# Patient Record
Sex: Female | Born: 1940
Health system: Southern US, Community
[De-identification: ages and names within clinical notes are randomized; demographics above are authoritative.]

## PROBLEM LIST (undated history)

## (undated) DIAGNOSIS — L501 Idiopathic urticaria: Secondary | ICD-10-CM

## (undated) DIAGNOSIS — N6009 Solitary cyst of unspecified breast: Secondary | ICD-10-CM

## (undated) DIAGNOSIS — I2699 Other pulmonary embolism without acute cor pulmonale: Secondary | ICD-10-CM

## (undated) DIAGNOSIS — T783XXA Angioneurotic edema, initial encounter: Secondary | ICD-10-CM

## (undated) DIAGNOSIS — I10 Essential (primary) hypertension: Secondary | ICD-10-CM

## (undated) DIAGNOSIS — R609 Edema, unspecified: Secondary | ICD-10-CM

## (undated) DIAGNOSIS — J45909 Unspecified asthma, uncomplicated: Secondary | ICD-10-CM

## (undated) DIAGNOSIS — G4733 Obstructive sleep apnea (adult) (pediatric): Secondary | ICD-10-CM

## (undated) DIAGNOSIS — I509 Heart failure, unspecified: Secondary | ICD-10-CM

## (undated) DIAGNOSIS — M810 Age-related osteoporosis without current pathological fracture: Secondary | ICD-10-CM

## (undated) HISTORY — DX: Heart failure, unspecified: I50.9

## (undated) HISTORY — DX: Angioneurotic edema, initial encounter: T78.3XXA

## (undated) HISTORY — DX: Age-related osteoporosis without current pathological fracture: M81.0

## (undated) HISTORY — PX: BREAST EXCISIONAL BIOPSY: SUR124

## (undated) HISTORY — DX: Edema, unspecified: R60.9

## (undated) HISTORY — DX: Idiopathic urticaria: L50.1

## (undated) HISTORY — DX: Other pulmonary embolism without acute cor pulmonale: I26.99

## (undated) HISTORY — DX: Essential (primary) hypertension: I10

## (undated) HISTORY — DX: Obstructive sleep apnea (adult) (pediatric): G47.33

## (undated) HISTORY — DX: Solitary cyst of unspecified breast: N60.09

## (undated) HISTORY — PX: BREAST SURGERY: SHX581

---

## 1998-08-10 ENCOUNTER — Ambulatory Visit (HOSPITAL_COMMUNITY): Admission: RE | Admit: 1998-08-10 | Discharge: 1998-08-10 | Payer: Self-pay | Admitting: Obstetrics and Gynecology

## 1998-08-13 ENCOUNTER — Ambulatory Visit (HOSPITAL_COMMUNITY): Admission: RE | Admit: 1998-08-13 | Discharge: 1998-08-13 | Payer: Self-pay | Admitting: Family Medicine

## 1998-08-13 ENCOUNTER — Encounter: Payer: Self-pay | Admitting: Family Medicine

## 1999-02-23 ENCOUNTER — Other Ambulatory Visit: Admission: RE | Admit: 1999-02-23 | Discharge: 1999-02-23 | Payer: Self-pay | Admitting: Obstetrics and Gynecology

## 1999-09-12 ENCOUNTER — Ambulatory Visit (HOSPITAL_COMMUNITY): Admission: RE | Admit: 1999-09-12 | Discharge: 1999-09-12 | Payer: Self-pay | Admitting: Obstetrics and Gynecology

## 1999-09-12 ENCOUNTER — Encounter: Payer: Self-pay | Admitting: Obstetrics and Gynecology

## 2000-11-07 ENCOUNTER — Encounter: Payer: Self-pay | Admitting: Obstetrics and Gynecology

## 2000-11-07 ENCOUNTER — Ambulatory Visit (HOSPITAL_COMMUNITY): Admission: RE | Admit: 2000-11-07 | Discharge: 2000-11-07 | Payer: Self-pay | Admitting: Obstetrics and Gynecology

## 2002-01-20 ENCOUNTER — Ambulatory Visit (HOSPITAL_COMMUNITY): Admission: RE | Admit: 2002-01-20 | Discharge: 2002-01-20 | Payer: Self-pay | Admitting: Obstetrics and Gynecology

## 2002-01-20 ENCOUNTER — Encounter: Payer: Self-pay | Admitting: Obstetrics and Gynecology

## 2002-08-06 ENCOUNTER — Other Ambulatory Visit: Admission: RE | Admit: 2002-08-06 | Discharge: 2002-08-06 | Payer: Self-pay | Admitting: Obstetrics and Gynecology

## 2002-08-20 ENCOUNTER — Encounter: Payer: Self-pay | Admitting: Family Medicine

## 2002-08-20 ENCOUNTER — Ambulatory Visit (HOSPITAL_COMMUNITY): Admission: RE | Admit: 2002-08-20 | Discharge: 2002-08-20 | Payer: Self-pay | Admitting: *Deleted

## 2003-02-24 ENCOUNTER — Encounter: Payer: Self-pay | Admitting: Obstetrics and Gynecology

## 2003-02-24 ENCOUNTER — Ambulatory Visit (HOSPITAL_COMMUNITY): Admission: RE | Admit: 2003-02-24 | Discharge: 2003-02-24 | Payer: Self-pay | Admitting: Obstetrics and Gynecology

## 2003-09-07 ENCOUNTER — Other Ambulatory Visit: Admission: RE | Admit: 2003-09-07 | Discharge: 2003-09-07 | Payer: Self-pay | Admitting: Obstetrics and Gynecology

## 2004-05-31 ENCOUNTER — Ambulatory Visit (HOSPITAL_COMMUNITY): Admission: RE | Admit: 2004-05-31 | Discharge: 2004-05-31 | Payer: Self-pay | Admitting: Obstetrics and Gynecology

## 2005-01-11 ENCOUNTER — Other Ambulatory Visit: Admission: RE | Admit: 2005-01-11 | Discharge: 2005-01-11 | Payer: Self-pay | Admitting: Obstetrics and Gynecology

## 2006-02-14 ENCOUNTER — Ambulatory Visit (HOSPITAL_COMMUNITY): Admission: RE | Admit: 2006-02-14 | Discharge: 2006-02-14 | Payer: Self-pay | Admitting: Obstetrics and Gynecology

## 2006-03-14 ENCOUNTER — Other Ambulatory Visit: Admission: RE | Admit: 2006-03-14 | Discharge: 2006-03-14 | Payer: Self-pay | Admitting: Obstetrics and Gynecology

## 2006-06-12 ENCOUNTER — Ambulatory Visit (HOSPITAL_COMMUNITY): Admission: RE | Admit: 2006-06-12 | Discharge: 2006-06-12 | Payer: Self-pay | Admitting: Family Medicine

## 2007-03-26 ENCOUNTER — Ambulatory Visit (HOSPITAL_COMMUNITY): Admission: RE | Admit: 2007-03-26 | Discharge: 2007-03-26 | Payer: Self-pay | Admitting: Obstetrics and Gynecology

## 2007-05-14 ENCOUNTER — Other Ambulatory Visit: Admission: RE | Admit: 2007-05-14 | Discharge: 2007-05-14 | Payer: Self-pay | Admitting: Obstetrics and Gynecology

## 2007-07-15 ENCOUNTER — Encounter: Admission: RE | Admit: 2007-07-15 | Discharge: 2007-07-15 | Payer: Self-pay | Admitting: Family Medicine

## 2007-07-23 ENCOUNTER — Other Ambulatory Visit: Admission: RE | Admit: 2007-07-23 | Discharge: 2007-07-23 | Payer: Self-pay | Admitting: Diagnostic Radiology

## 2007-07-23 ENCOUNTER — Encounter (INDEPENDENT_AMBULATORY_CARE_PROVIDER_SITE_OTHER): Payer: Self-pay | Admitting: Diagnostic Radiology

## 2007-07-23 ENCOUNTER — Encounter: Admission: RE | Admit: 2007-07-23 | Discharge: 2007-07-23 | Payer: Self-pay | Admitting: Family Medicine

## 2008-05-21 ENCOUNTER — Ambulatory Visit (HOSPITAL_COMMUNITY): Admission: RE | Admit: 2008-05-21 | Discharge: 2008-05-21 | Payer: Self-pay | Admitting: Obstetrics and Gynecology

## 2009-05-28 ENCOUNTER — Ambulatory Visit (HOSPITAL_COMMUNITY): Admission: RE | Admit: 2009-05-28 | Discharge: 2009-05-28 | Payer: Self-pay | Admitting: Obstetrics and Gynecology

## 2009-06-03 ENCOUNTER — Other Ambulatory Visit: Admission: RE | Admit: 2009-06-03 | Discharge: 2009-06-03 | Payer: Self-pay | Admitting: Obstetrics and Gynecology

## 2009-06-03 ENCOUNTER — Ambulatory Visit: Payer: Self-pay | Admitting: Obstetrics and Gynecology

## 2009-07-07 ENCOUNTER — Ambulatory Visit: Payer: Self-pay | Admitting: Obstetrics and Gynecology

## 2009-07-08 ENCOUNTER — Ambulatory Visit: Payer: Self-pay | Admitting: Obstetrics and Gynecology

## 2010-05-29 ENCOUNTER — Encounter: Payer: Self-pay | Admitting: Family Medicine

## 2010-07-26 ENCOUNTER — Other Ambulatory Visit: Payer: Self-pay | Admitting: Obstetrics and Gynecology

## 2010-07-26 DIAGNOSIS — Z1231 Encounter for screening mammogram for malignant neoplasm of breast: Secondary | ICD-10-CM

## 2010-08-02 ENCOUNTER — Ambulatory Visit (HOSPITAL_COMMUNITY)
Admission: RE | Admit: 2010-08-02 | Discharge: 2010-08-02 | Disposition: A | Discharge status: Home / Self Care | Payer: BC Managed Care – PPO | Source: Ambulatory Visit | Attending: Obstetrics and Gynecology | Admitting: Obstetrics and Gynecology

## 2010-08-02 DIAGNOSIS — Z1231 Encounter for screening mammogram for malignant neoplasm of breast: Secondary | ICD-10-CM | POA: Insufficient documentation

## 2010-12-22 ENCOUNTER — Encounter: Payer: Self-pay | Admitting: Gynecology

## 2010-12-22 DIAGNOSIS — M8580 Other specified disorders of bone density and structure, unspecified site: Secondary | ICD-10-CM | POA: Insufficient documentation

## 2010-12-22 DIAGNOSIS — I10 Essential (primary) hypertension: Secondary | ICD-10-CM | POA: Insufficient documentation

## 2010-12-22 DIAGNOSIS — N6009 Solitary cyst of unspecified breast: Secondary | ICD-10-CM | POA: Insufficient documentation

## 2010-12-30 ENCOUNTER — Encounter: Payer: Self-pay | Admitting: Obstetrics and Gynecology

## 2010-12-30 ENCOUNTER — Ambulatory Visit (INDEPENDENT_AMBULATORY_CARE_PROVIDER_SITE_OTHER): Payer: BC Managed Care – PPO | Admitting: Obstetrics and Gynecology

## 2010-12-30 VITALS — BP 130/80 | Ht 63.5 in | Wt 243.0 lb

## 2010-12-30 DIAGNOSIS — R82998 Other abnormal findings in urine: Secondary | ICD-10-CM

## 2010-12-30 DIAGNOSIS — R102 Pelvic and perineal pain: Secondary | ICD-10-CM

## 2010-12-30 DIAGNOSIS — R3915 Urgency of urination: Secondary | ICD-10-CM

## 2010-12-30 DIAGNOSIS — R350 Frequency of micturition: Secondary | ICD-10-CM

## 2010-12-30 DIAGNOSIS — M8580 Other specified disorders of bone density and structure, unspecified site: Secondary | ICD-10-CM

## 2010-12-30 DIAGNOSIS — M949 Disorder of cartilage, unspecified: Secondary | ICD-10-CM

## 2010-12-30 DIAGNOSIS — N949 Unspecified condition associated with female genital organs and menstrual cycle: Secondary | ICD-10-CM

## 2010-12-30 DIAGNOSIS — M899 Disorder of bone, unspecified: Secondary | ICD-10-CM

## 2010-12-30 NOTE — Progress Notes (Signed)
The patient came to see me today. She says she's now getting up every one to 2 hours to go to the bathroom. It is just urinating. She is not incontinent of urine. She does not have dysuria. She does have a lot of urgency. She is now being evaluated for glaucoma by her eye doctor. She does have low bone mass on DEXA. She has a normal fracture risk. Her bone density has improved from 2009 to 2011. She is taking her calcium and D. She has had no fractures. She is having some lower abdominal pressure along with this frequent urination.  Past medical history, family history, and social history reviewed and in record.  Review of systems: 9 system review of systems done. Along with a pertinent positives and negatives above, she gives a history of weight gain, lightheadedness, hypertension, and heartburn.  Physical examination: HEENT within normal limits. Neck: Thyroid not large. No masses. Supraclavicular nodes: not enlarged. Breasts: Examined in both sitting midline position. No skin changes and no masses. Abdomen: Soft no guarding rebound or masses or hernia. Pelvic: External: Within normal limits. BUS: Within normal limits. Vaginal:within normal limits. Good estrogen effect. No evidence of cystocele, rectocele or enterocele. Cervix clean. Uterus normal size and shape. Adnexa fails to reveal masses. Rectovaginal confirmatory. Extremities within normal limits  Assessment: 1. Extreme nocturia 2. Urinary urgency 3. Osteopenia 4. Possible glaucoma   Plan: 1. Ultrasound scheduled because of the above symptoms 2.Continue yearly mammograms 3. Continue periodic bone densities 4. Patient given samples of enable access 7.5 mg daily. She will not start them until she discusses this with her eye doctor.

## 2011-01-02 ENCOUNTER — Telehealth: Payer: Self-pay

## 2011-01-02 DIAGNOSIS — N390 Urinary tract infection, site not specified: Secondary | ICD-10-CM

## 2011-01-02 MED ORDER — CIPROFLOXACIN HCL 250 MG PO TABS: 250.0000 mg | ORAL_TABLET | Freq: Two times a day (BID) | ORAL | Status: AC

## 2011-01-02 NOTE — Telephone Encounter (Signed)
Message copied by Venora Maples on Mon Jan 02, 2011 12:11 PM ------      Message from: Trellis Paganini      Created: Mon Jan 02, 2011  9:47 AM       The patient has UTI. Please call her and tell her we are going to treat her with Cipro 250 mg twice a day for 7 days. She then needs to return for followup urine culture. I will e- prescribed to her medication.

## 2011-01-02 NOTE — Progress Notes (Signed)
Addended by: Trellis Paganini on: 01/02/2011 09:50 AM   Modules accepted: Orders

## 2011-01-04 NOTE — Telephone Encounter (Signed)
PT. NOTIFIIED OF DR. G'S NOTE BELOW & SHE IS HAVING URINARY URGENCY. SHE WILL PICK UP RX TODAY. SHE HAS AN APPT. 01/13/11 FOR AN ULTRASOUND & WILL CHECK URINE CULTURE THEN. I PUT ORDER IN MEDS & ORDERS.

## 2011-01-11 ENCOUNTER — Other Ambulatory Visit: Payer: BC Managed Care – PPO

## 2011-01-11 ENCOUNTER — Ambulatory Visit: Payer: BC Managed Care – PPO | Admitting: Obstetrics and Gynecology

## 2011-01-13 ENCOUNTER — Other Ambulatory Visit: Payer: BC Managed Care – PPO

## 2011-01-13 ENCOUNTER — Ambulatory Visit (INDEPENDENT_AMBULATORY_CARE_PROVIDER_SITE_OTHER): Payer: BC Managed Care – PPO | Admitting: Obstetrics and Gynecology

## 2011-01-13 DIAGNOSIS — N949 Unspecified condition associated with female genital organs and menstrual cycle: Secondary | ICD-10-CM

## 2011-01-13 DIAGNOSIS — N390 Urinary tract infection, site not specified: Secondary | ICD-10-CM

## 2011-01-13 DIAGNOSIS — R102 Pelvic and perineal pain: Secondary | ICD-10-CM

## 2011-01-13 DIAGNOSIS — D259 Leiomyoma of uterus, unspecified: Secondary | ICD-10-CM

## 2011-01-13 DIAGNOSIS — D219 Benign neoplasm of connective and other soft tissue, unspecified: Secondary | ICD-10-CM

## 2011-01-13 DIAGNOSIS — N840 Polyp of corpus uteri: Secondary | ICD-10-CM

## 2011-01-13 DIAGNOSIS — Z5189 Encounter for other specified aftercare: Secondary | ICD-10-CM

## 2011-01-13 NOTE — Progress Notes (Signed)
Addended byCammie Mcgee T on: 01/13/2011 04:50 PM   Modules accepted: Orders

## 2011-01-13 NOTE — Progress Notes (Signed)
The patient came back today for an ultrasound because of lower abdominal pressure and urinary frequency. On ultrasound she has 2 fibroids both of which are under 1 cm. Her endometrial echo is 6.5 mm and looks like she has a small endometrial polyp of 6 mm. She is having no vaginal bleeding. Her right ovary is normal. On her left ovary she has echo-free thin-walled avascular cyst of 1.5 cm. Her cul-de-sac is free of fluid. Her urine culture came back positive and she was treated with Cipro for 7 days. She says her urinary symptoms are 95% improved.  Assessment: 1. Fibroids 2. Small endometrial polyp 3. UTI 4. Benign left ovarian cyst  Plan: Discussed in detail the findings of her ultrasound. I told her we did not need to intervene with  the small polyp or the cyst. She will do a urine and urine culture to day to be sure her UTI is cleared. She will call me with any vaginal bleeding.

## 2011-09-11 ENCOUNTER — Other Ambulatory Visit: Payer: Self-pay | Admitting: Obstetrics and Gynecology

## 2011-09-11 DIAGNOSIS — Z1231 Encounter for screening mammogram for malignant neoplasm of breast: Secondary | ICD-10-CM

## 2011-10-10 ENCOUNTER — Ambulatory Visit (HOSPITAL_COMMUNITY)
Admission: RE | Admit: 2011-10-10 | Discharge: 2011-10-10 | Disposition: A | Discharge status: Home / Self Care | Payer: BC Managed Care – PPO | Source: Ambulatory Visit | Attending: Obstetrics and Gynecology | Admitting: Obstetrics and Gynecology

## 2011-10-10 DIAGNOSIS — Z1231 Encounter for screening mammogram for malignant neoplasm of breast: Secondary | ICD-10-CM

## 2011-11-21 ENCOUNTER — Other Ambulatory Visit: Payer: Self-pay | Admitting: Family Medicine

## 2011-11-21 ENCOUNTER — Ambulatory Visit
Admission: RE | Admit: 2011-11-21 | Discharge: 2011-11-21 | Disposition: A | Discharge status: Home / Self Care | Payer: BC Managed Care – PPO | Source: Ambulatory Visit | Attending: Family Medicine | Admitting: Family Medicine

## 2011-11-21 DIAGNOSIS — M1250 Traumatic arthropathy, unspecified site: Secondary | ICD-10-CM

## 2011-12-06 ENCOUNTER — Other Ambulatory Visit: Payer: Self-pay | Admitting: Orthopedic Surgery

## 2011-12-06 DIAGNOSIS — M25512 Pain in left shoulder: Secondary | ICD-10-CM

## 2011-12-11 ENCOUNTER — Ambulatory Visit
Admission: RE | Admit: 2011-12-11 | Discharge: 2011-12-11 | Disposition: A | Discharge status: Home / Self Care | Payer: Federal, State, Local not specified - PPO | Source: Ambulatory Visit | Attending: Orthopedic Surgery | Admitting: Orthopedic Surgery

## 2011-12-11 DIAGNOSIS — M25512 Pain in left shoulder: Secondary | ICD-10-CM

## 2012-01-22 ENCOUNTER — Encounter: Payer: Self-pay | Admitting: Obstetrics and Gynecology

## 2012-01-22 ENCOUNTER — Ambulatory Visit (INDEPENDENT_AMBULATORY_CARE_PROVIDER_SITE_OTHER): Payer: Federal, State, Local not specified - PPO | Admitting: Obstetrics and Gynecology

## 2012-01-22 VITALS — BP 148/82 | Ht 63.0 in | Wt 250.0 lb

## 2012-01-22 DIAGNOSIS — Z01419 Encounter for gynecological examination (general) (routine) without abnormal findings: Secondary | ICD-10-CM

## 2012-01-22 DIAGNOSIS — R351 Nocturia: Secondary | ICD-10-CM

## 2012-01-22 MED ORDER — DARIFENACIN HYDROBROMIDE ER 7.5 MG PO TB24: ORAL_TABLET | ORAL | Status: DC

## 2012-01-22 NOTE — Patient Instructions (Signed)
Continue yearly mammograms. Scheduled bone density in 2014.

## 2012-01-22 NOTE — Progress Notes (Signed)
Patient came to see me  today for her annual GYN exam. We have had her on Enablex 7.5 mg daily for urgency and nocturia with some good results but she thinks the medicine wears off as the middle the night continues. She had a ultrasound last year here for pelvic pain which she is not currently having. It showed  small fibroids and an avascular, echo-free ovarian cyst of 1.5 cm. There was a suggestion of a 6 mm endometrial polyp but the patient has had no bleeding. She had a bone density in March, 2011 which showed low bone mass without an elevated FRAX risk. Her bone density was better than previous ones. She's had no fractures. She is having no vaginal bleeding. She is having no pelvic pain. She has always had normal Pap smears. Her last Pap smear was 2011. She had a normal mammogram this year.  Physical examination:  Leonard Schwartz present. HEENT within normal limits. Neck: Thyroid not large. No masses. Supraclavicular nodes: not enlarged. Breasts: Examined in both sitting and lying  position. No skin changes and no masses. Abdomen: Soft no guarding rebound or masses or hernia. Pelvic: External: Within normal limits. BUS: Within normal limits. Vaginal:within normal limits. Good estrogen effect. No evidence of cystocele rectocele or enterocele. Cervix: clean. Uterus:Top normal size and shape. Adnexa: No masses. Rectovaginal exam: Confirmatory and negative. Extremities: Within normal limits.  Assessment: #1. Osteopenia #2. Detrussor instability #3. Small fibroids #4. Possible small endometrial polyp  Plan: We increased her Enablex 7.5 mg twice a day. She will let me know if not effective. She will continue yearly mammograms. She will continue periodic bone densities-we should do one in 2014. Lab work from her PCP.The new Pap smear guidelines were discussed with the patient. No  pap done.

## 2012-01-29 ENCOUNTER — Encounter: Payer: Self-pay | Admitting: Obstetrics and Gynecology

## 2012-02-16 ENCOUNTER — Telehealth: Payer: Self-pay | Admitting: *Deleted

## 2012-02-16 NOTE — Telephone Encounter (Signed)
Pt called back today and the patient was given the option to come back to office to check or have checked at her work and she chose to have it ran at her work. Therefore an order was faxed to her job. KW

## 2012-02-16 NOTE — Telephone Encounter (Signed)
(  late entry) I left a message for the patient since at her [facility] she didn't leave a UA sample and she complained of nocturia.

## 2012-10-11 ENCOUNTER — Other Ambulatory Visit: Payer: Self-pay | Admitting: Otolaryngology

## 2012-10-11 DIAGNOSIS — H903 Sensorineural hearing loss, bilateral: Secondary | ICD-10-CM

## 2012-10-11 DIAGNOSIS — H9319 Tinnitus, unspecified ear: Secondary | ICD-10-CM

## 2012-10-11 DIAGNOSIS — H905 Unspecified sensorineural hearing loss: Secondary | ICD-10-CM

## 2012-10-23 ENCOUNTER — Ambulatory Visit
Admission: RE | Admit: 2012-10-23 | Discharge: 2012-10-23 | Disposition: A | Discharge status: Home / Self Care | Payer: Federal, State, Local not specified - PPO | Source: Ambulatory Visit | Attending: Otolaryngology | Admitting: Otolaryngology

## 2012-10-23 DIAGNOSIS — H9319 Tinnitus, unspecified ear: Secondary | ICD-10-CM

## 2012-10-23 DIAGNOSIS — H903 Sensorineural hearing loss, bilateral: Secondary | ICD-10-CM

## 2012-10-23 DIAGNOSIS — H905 Unspecified sensorineural hearing loss: Secondary | ICD-10-CM

## 2012-10-23 MED ORDER — GADOBENATE DIMEGLUMINE 529 MG/ML IV SOLN
20.0000 mL | Freq: Once | INTRAVENOUS | Status: AC | PRN
  Administered 2012-10-23: 20 mL via INTRAVENOUS

## 2012-11-19 ENCOUNTER — Other Ambulatory Visit: Payer: Self-pay | Admitting: Gynecology

## 2012-11-19 DIAGNOSIS — Z1231 Encounter for screening mammogram for malignant neoplasm of breast: Secondary | ICD-10-CM

## 2012-11-23 ENCOUNTER — Emergency Department (HOSPITAL_COMMUNITY)
Admission: EM | Admit: 2012-11-23 | Discharge: 2012-11-23 | Disposition: A | Discharge status: Home / Self Care | Payer: Federal, State, Local not specified - PPO | Source: Home / Self Care

## 2012-11-23 ENCOUNTER — Encounter (HOSPITAL_COMMUNITY): Payer: Self-pay | Admitting: *Deleted

## 2012-11-23 DIAGNOSIS — R220 Localized swelling, mass and lump, head: Secondary | ICD-10-CM

## 2012-11-23 DIAGNOSIS — T7840XA Allergy, unspecified, initial encounter: Secondary | ICD-10-CM

## 2012-11-23 DIAGNOSIS — L509 Urticaria, unspecified: Secondary | ICD-10-CM

## 2012-11-23 DIAGNOSIS — R221 Localized swelling, mass and lump, neck: Secondary | ICD-10-CM

## 2012-11-23 MED ORDER — METHYLPREDNISOLONE ACETATE 80 MG/ML IJ SUSP
INTRAMUSCULAR | Status: AC
  Filled 2012-11-23: qty 1

## 2012-11-23 MED ORDER — DIPHENHYDRAMINE HCL 50 MG/ML IJ SOLN
50.0000 mg | Freq: Once | INTRAMUSCULAR | Status: AC
  Administered 2012-11-23: 50 mg via INTRAMUSCULAR

## 2012-11-23 MED ORDER — TRIAMCINOLONE ACETONIDE 40 MG/ML IJ SUSP
INTRAMUSCULAR | Status: AC
  Filled 2012-11-23: qty 1

## 2012-11-23 MED ORDER — TRIAMCINOLONE ACETONIDE 0.1 % EX CREA: TOPICAL_CREAM | CUTANEOUS | Status: DC

## 2012-11-23 MED ORDER — DIPHENHYDRAMINE HCL 50 MG/ML IJ SOLN
INTRAMUSCULAR | Status: AC
  Filled 2012-11-23: qty 1

## 2012-11-23 MED ORDER — TRIAMCINOLONE ACETONIDE 40 MG/ML IJ SUSP
60.0000 mg | Freq: Once | INTRAMUSCULAR | Status: AC
  Administered 2012-11-23: 60 mg via INTRAMUSCULAR

## 2012-11-23 NOTE — ED Provider Notes (Signed)
History    CSN: [id number] Arrival date & time 11/23/12  1255  First MD Initiated Contact with Patient 11/23/12 1412     Chief Complaint  Patient presents with  . Allergic Reaction   (Consider location/radiation/quality/duration/timing/severity/associated sxs/prior Treatment) HPI Comments: 72 year old female awoke this morning with swelling of the right side of her face. This was followed by a large area of urticaria to the right upper arm. She is complaining of itching in various spots of her body including her left palm. He swelling to the right side of the face has migrated to the lips and she is rather large swollen and itchy upper and lower lids. There care he is limited to the right upper arm and no other areas of the body appear to be affected. She denies problems with speaking, swallowing or breathing. No areas of swelling. Her speech is clear and appropriate.  Past Medical History  Diagnosis Date  . Breast cyst   . Osteopenia   . Hypertension    Past Surgical History  Procedure Laterality Date  . Breast surgery      biopsy-cyst   Family History  Problem Relation Age of Onset  . Breast cancer Mother 15  . Heart disease Father 63    MI   History  Substance Use Topics  . Smoking status: Never Smoker   . Smokeless tobacco: Never Used  . Alcohol Use: Yes     Comment: wine occassionally   OB History   Grav Para Term Preterm Abortions TAB SAB Ect Mult Living   4 3 3  1     3      Review of Systems  Constitutional: Negative for fever, chills, activity change, appetite change and fatigue.  HENT: Negative for hearing loss, ear pain, congestion, sore throat, rhinorrhea, sneezing, drooling, neck pain, voice change, postnasal drip and ear discharge.   Eyes: Negative.   Respiratory: Negative for cough, choking, shortness of breath, wheezing and stridor.   Cardiovascular: Negative.   Gastrointestinal: Negative.   Genitourinary: Negative.   Skin: Positive for rash.   Neurological: Negative.     Allergies  Review of patient's allergies indicates no known allergies.  Home Medications   Current Outpatient Rx  Name  Route  Sig  Dispense  Refill  . Amlodipine-Olmesartan (AZOR PO)   Oral   Take by mouth daily.           Marland Kitchen darifenacin (ENABLEX) 7.5 MG 24 hr tablet      Take one pill twice a day   60 tablet   12   . ECHINACEA PO   Oral   Take by mouth.           . Multiple Vitamin (MULTIVITAMIN) tablet   Oral   Take 1 tablet by mouth daily. occassionally         . triamcinolone cream (KENALOG) 0.1 %      Apply to affected area BID   30 g   0    BP 135/72  Pulse 66  Temp(Src) 97.9 F (36.6 C) (Oral)  Resp 16  SpO2 100% Physical Exam  Nursing note and vitals reviewed. Constitutional: She is oriented to person, place, and time. She appears well-developed and well-nourished. No distress.  HENT:  Nose: Nose normal.  Mouth/Throat: Oropharynx is clear and moist. No oropharyngeal exudate.  No intraoral structures with swelling or erythema. The upper and lower lips are edematous and mildly tender.  Eyes: Conjunctivae and EOM are normal. Pupils are  equal, round, and reactive to light.  Neck: Normal range of motion. Neck supple.  Cardiovascular: Normal rate and regular rhythm.   Pulmonary/Chest: Effort normal and breath sounds normal. No respiratory distress. She has no wheezes. She has no rales.  Musculoskeletal: She exhibits no edema and no tenderness.  Lymphadenopathy:    She has no cervical adenopathy.  Neurological: She is alert and oriented to person, place, and time. She exhibits normal muscle tone.  Skin: Skin is warm and dry.  Well marginated but large wheal located over the  medial aspect of the right upper arm.  Psychiatric: She has a normal mood and affect.    ED Course  Procedures (including critical care time) Labs Reviewed - No data to display No results found. 1. Urticaria   2. Facial swelling   3. Acute  allergic reaction, initial encounter     MDM  This appears to be a localized allergic reaction. The distribution to the right side of the face and lips as well as the right upper arm suggests this may be due to a sting or bite of some sort. There is no one vomit of the lungs. No cough, wheeze or shortness of breath. No intraoral edema or swelling. Benadryl 50 mg IM and Kenalog 60 mg IM Place ice on the areas of swelling. Continue the Benadryl 2 mg by mouth q. 4-6 hours when necessary at home they also had Allegra or Claritin one tablet a day. For worsening new symptoms or problems such as breathing problems, coughing or swelling in the mouth or to emergency apartment.  Hayden Rasmussen, NP 11/23/12 1441

## 2012-11-23 NOTE — ED Provider Notes (Signed)
Medical screening examination/treatment/procedure(s) were performed by non-physician practitioner and as supervising physician I was immediately available for consultation/collaboration.  Leslee Home, M.D.  Reuben Likes, MD 11/23/12 [phone]

## 2012-11-23 NOTE — ED Notes (Signed)
Pt  Reports  She  Noticed       To   Have     Swelling to  Her  Lips  Which  Started  Out r  Side  Of face     This  Am    -  She  Also has  Red  Swollen itchy  Area to r  Upper  Arm         Denys  Any new  meds  Or  Any known  Causative  Agents       Sitting upright on  Exam table  Speaking in  Complete   sentances

## 2012-12-02 ENCOUNTER — Ambulatory Visit (HOSPITAL_COMMUNITY)
Admission: RE | Admit: 2012-12-02 | Discharge: 2012-12-02 | Disposition: A | Discharge status: Home / Self Care | Payer: Federal, State, Local not specified - PPO | Source: Ambulatory Visit | Attending: Gynecology | Admitting: Gynecology

## 2012-12-02 DIAGNOSIS — Z1231 Encounter for screening mammogram for malignant neoplasm of breast: Secondary | ICD-10-CM

## 2013-08-22 ENCOUNTER — Emergency Department (INDEPENDENT_AMBULATORY_CARE_PROVIDER_SITE_OTHER): Payer: Federal, State, Local not specified - PPO

## 2013-08-22 ENCOUNTER — Emergency Department (HOSPITAL_COMMUNITY)
Admission: EM | Admit: 2013-08-22 | Discharge: 2013-08-22 | Disposition: A | Discharge status: Home / Self Care | Payer: Federal, State, Local not specified - PPO | Source: Home / Self Care | Attending: Family Medicine | Admitting: Family Medicine

## 2013-08-22 ENCOUNTER — Encounter (HOSPITAL_COMMUNITY): Payer: Self-pay | Admitting: Emergency Medicine

## 2013-08-22 DIAGNOSIS — J680 Bronchitis and pneumonitis due to chemicals, gases, fumes and vapors: Secondary | ICD-10-CM

## 2013-08-22 MED ORDER — ALBUTEROL SULFATE (2.5 MG/3ML) 0.083% IN NEBU
5.0000 mg | INHALATION_SOLUTION | Freq: Once | RESPIRATORY_TRACT | Status: AC
  Administered 2013-08-22: 5 mg via RESPIRATORY_TRACT

## 2013-08-22 MED ORDER — BENZONATATE 100 MG PO CAPS: 100.0000 mg | ORAL_CAPSULE | Freq: Three times a day (TID) | ORAL | Status: DC

## 2013-08-22 MED ORDER — IPRATROPIUM BROMIDE 0.02 % IN SOLN
0.5000 mg | Freq: Once | RESPIRATORY_TRACT | Status: AC
  Administered 2013-08-22: 0.5 mg via RESPIRATORY_TRACT

## 2013-08-22 MED ORDER — PREDNISONE 20 MG PO TABS
ORAL_TABLET | ORAL | Status: AC
  Filled 2013-08-22: qty 3

## 2013-08-22 MED ORDER — PREDNISONE 10 MG PO TABS: ORAL_TABLET | ORAL | Status: DC

## 2013-08-22 MED ORDER — PREDNISONE 20 MG PO TABS
60.0000 mg | ORAL_TABLET | Freq: Once | ORAL | Status: AC
  Administered 2013-08-22: 60 mg via ORAL

## 2013-08-22 MED ORDER — IPRATROPIUM BROMIDE 0.02 % IN SOLN
RESPIRATORY_TRACT | Status: AC
  Filled 2013-08-22: qty 2.5

## 2013-08-22 MED ORDER — ALBUTEROL SULFATE (2.5 MG/3ML) 0.083% IN NEBU
INHALATION_SOLUTION | RESPIRATORY_TRACT | Status: AC
  Filled 2013-08-22: qty 6

## 2013-08-22 MED ORDER — ALBUTEROL SULFATE HFA 108 (90 BASE) MCG/ACT IN AERS: 1.0000 | INHALATION_SPRAY | Freq: Four times a day (QID) | RESPIRATORY_TRACT | Status: DC | PRN

## 2013-08-22 NOTE — ED Provider Notes (Signed)
CSN: [id number]     Arrival date & time 08/22/13  4854 History   First MD Initiated Contact with Patient 08/22/13 1000     Chief Complaint  Patient presents with  . Cough   (Consider location/radiation/quality/duration/timing/severity/associated sxs/prior Treatment) Patient is a 73 y.o. female presenting with cough. The history is provided by the patient.  Cough Cough characteristics:  Barking Severity:  Moderate Onset quality:  Gradual Duration:  6 days Timing:  Constant Progression:  Worsening Chronicity:  New Smoker: no   Context: fumes   Context comment:  Patient states symptoms began one day after spray painting a cabinet without wearing a mask. She is concerned that inhaled fumes caused the cough. Associated symptoms: no chest pain, no chills, no diaphoresis, no ear fullness, no ear pain, no fever, no headaches, no myalgias, no rash, no rhinorrhea, no shortness of breath, no sinus congestion, no sore throat, no weight loss and no wheezing     Past Medical History  Diagnosis Date  . Breast cyst   . Osteopenia   . Hypertension    Past Surgical History  Procedure Laterality Date  . Breast surgery      biopsy-cyst   Family History  Problem Relation Age of Onset  . Breast cancer Mother 72  . Heart disease Father 38    MI   History  Substance Use Topics  . Smoking status: Never Smoker   . Smokeless tobacco: Never Used  . Alcohol Use: Yes     Comment: wine occassionally   OB History   Grav Para Term Preterm Abortions TAB SAB Ect Mult Living   4 3 3  1     3      Review of Systems  Constitutional: Negative for fever, chills, weight loss and diaphoresis.  HENT: Negative for congestion, ear pain, rhinorrhea, sinus pressure and sore throat.   Eyes: Negative.   Respiratory: Positive for cough and chest tightness. Negative for shortness of breath and wheezing.   Cardiovascular: Negative for chest pain, palpitations and leg swelling.  Genitourinary: Negative.    Musculoskeletal: Negative for arthralgias and myalgias.  Skin: Negative for pallor and rash.  Neurological: Negative for dizziness, syncope and headaches.    Allergies  Review of patient's allergies indicates no known allergies.  Home Medications   Prior to Admission medications   Medication Sig Start Date End Date Taking? Authorizing Provider  Amlodipine-Olmesartan (AZOR PO) Take by mouth daily.      Historical Provider, MD  darifenacin (ENABLEX) 7.5 MG 24 hr tablet Take one pill twice a day 01/22/12   Bennetta Laos, MD  ECHINACEA PO Take by mouth.      Historical Provider, MD  Multiple Vitamin (MULTIVITAMIN) tablet Take 1 tablet by mouth daily. occassionally    Historical Provider, MD  triamcinolone cream (KENALOG) 0.1 % Apply to affected area BID 11/23/12   Janne Napoleon, NP   BP 129/64  Pulse 95  Temp(Src) 98.2 F (36.8 C) (Oral)  Resp 16  SpO2 99% Physical Exam  Nursing note and vitals reviewed. Constitutional: She is oriented to person, place, and time. She appears well-developed and well-nourished. No distress.  HENT:  Head: Normocephalic and atraumatic.  Eyes: Conjunctivae are normal. No scleral icterus.  Neck: Normal range of motion. Neck supple. No JVD present.  Cardiovascular: Normal rate, regular rhythm and normal heart sounds.   Pulmonary/Chest: Effort normal. No respiratory distress. She has wheezes. She has no rales. She exhibits no tenderness.  Musculoskeletal: Normal range of  motion.  Lymphadenopathy:    She has no cervical adenopathy.  Neurological: She is alert and oriented to person, place, and time.  Skin: Skin is warm and dry.  Psychiatric: She has a normal mood and affect. Her behavior is normal.    ED Course  Procedures (including critical care time) Labs Review Labs Reviewed - No data to display  No results found for this or any previous visit. Imaging Review Dg Chest 2 View  08/22/2013   CLINICAL DATA:  Cough and wheezing  EXAM: CHEST  2  VIEW  COMPARISON:  DG CHEST 2 VIEW dated 06/12/2006  FINDINGS: The heart size and mediastinal contours are within normal limits. Both lungs are clear. The visualized skeletal structures are unremarkable.  IMPRESSION: No active cardiopulmonary disease.   Electronically Signed   By: Conchita Paris M.D.   On: 08/22/2013 11:04     MDM   1. Acute bronchitis due to chemical fumes    CXR without evidence of acute illness. Some relief in UCC following albuterol/atrovent neb. Will treat at home with prednisone taper, tessalon and albuterol MDI and advise close follow up with PCP.    [place], [place] 08/22/13 [phone]

## 2013-08-22 NOTE — Discharge Instructions (Signed)

## 2013-08-22 NOTE — ED Notes (Signed)
Cough since past 4 days. Productive sounding. Has been treating herself at [place] in [place] where she works

## 2013-08-22 NOTE — ED Provider Notes (Signed)
Medical screening examination/treatment/procedure(s) were performed by resident physician or non-physician practitioner and as supervising physician I was immediately available for consultation/collaboration.   Pauline Good MD.   Billy Fischer, MD 08/22/13 1224

## 2013-12-07 ENCOUNTER — Emergency Department (INDEPENDENT_AMBULATORY_CARE_PROVIDER_SITE_OTHER)
Admission: EM | Admit: 2013-12-07 | Discharge: 2013-12-07 | Disposition: A | Discharge status: Home / Self Care | Payer: Federal, State, Local not specified - PPO | Source: Home / Self Care | Attending: Family Medicine | Admitting: Family Medicine

## 2013-12-07 ENCOUNTER — Encounter (HOSPITAL_COMMUNITY): Payer: Self-pay | Admitting: Emergency Medicine

## 2013-12-07 DIAGNOSIS — K0889 Other specified disorders of teeth and supporting structures: Secondary | ICD-10-CM

## 2013-12-07 DIAGNOSIS — K089 Disorder of teeth and supporting structures, unspecified: Secondary | ICD-10-CM

## 2013-12-07 MED ORDER — CLINDAMYCIN HCL 300 MG PO CAPS: 300.0000 mg | ORAL_CAPSULE | Freq: Three times a day (TID) | ORAL | Status: DC

## 2013-12-07 MED ORDER — DICLOFENAC POTASSIUM 50 MG PO TABS: 50.0000 mg | ORAL_TABLET | Freq: Three times a day (TID) | ORAL | Status: DC

## 2013-12-07 NOTE — ED Provider Notes (Signed)
CSN: [id number]     Arrival date & time 12/07/13  1409 History   First MD Initiated Contact with Patient 12/07/13 1422     Chief Complaint  Patient presents with  . Dental Pain   (Consider location/radiation/quality/duration/timing/severity/associated sxs/prior Treatment) Patient is a 73 y.o. female presenting with tooth pain. The history is provided by the patient.  Dental Pain Location:  Upper Upper teeth location:  6/RU cuspid Quality:  Throbbing Severity:  Mild Onset quality:  Sudden Duration:  2 days Progression:  Unchanged Chronicity:  New Context: dental caries and poor dentition   Associated symptoms: no facial swelling and no fever   Risk factors: lack of dental care and periodontal disease     Past Medical History  Diagnosis Date  . Breast cyst   . Osteopenia   . Hypertension    Past Surgical History  Procedure Laterality Date  . Breast surgery      biopsy-cyst   Family History  Problem Relation Age of Onset  . Breast cancer Mother 49  . Heart disease Father 46    MI   History  Substance Use Topics  . Smoking status: Never Smoker   . Smokeless tobacco: Never Used  . Alcohol Use: Yes     Comment: wine occassionally   OB History   Grav Para Term Preterm Abortions TAB SAB Ect Mult Living   4 3 3  1     3      Review of Systems  Constitutional: Negative.  Negative for fever.  HENT: Positive for dental problem. Negative for facial swelling.     Allergies  Review of patient's allergies indicates no known allergies.  Home Medications   Prior to Admission medications   Medication Sig Start Date End Date Taking? Authorizing Provider  albuterol (PROVENTIL HFA;VENTOLIN HFA) 108 (90 BASE) MCG/ACT inhaler Inhale 1-2 puffs into the lungs every 6 (six) hours as needed for wheezing or shortness of breath. 08/22/13   Lahoma Rocker, PA  Amlodipine-Olmesartan (AZOR PO) Take by mouth daily.      Historical Provider, MD  benzonatate (TESSALON) 100 MG capsule  Take 1 capsule (100 mg total) by mouth every 8 (eight) hours. 08/22/13   Lahoma Rocker, PA  clindamycin (CLEOCIN) 300 MG capsule Take 1 capsule (300 mg total) by mouth 3 (three) times daily. 12/07/13   Billy Fischer, MD  darifenacin (ENABLEX) 7.5 MG 24 hr tablet Take one pill twice a day 01/22/12   Bennetta Laos, MD  diclofenac (CATAFLAM) 50 MG tablet Take 1 tablet (50 mg total) by mouth 3 (three) times daily. For dental pain 12/07/13   Billy Fischer, MD  ECHINACEA PO Take by mouth.      Historical Provider, MD  Multiple Vitamin (MULTIVITAMIN) tablet Take 1 tablet by mouth daily. occassionally    Historical Provider, MD  predniSONE (DELTASONE) 10 MG tablet Beginning 08-23-2013, take 5 tabs po QD day 1, 4 tabs po QD day 2, 3 tabs po QD day 3, 2 tabs po QD day 4, 1 tab po QD day 5 then stop 08/22/13   Lahoma Rocker, PA  triamcinolone cream (KENALOG) 0.1 % Apply to affected area BID 11/23/12   Janne Napoleon, NP   BP 143/82  Pulse 81  Temp(Src) 98.4 F (36.9 C) (Oral)  Resp 16  SpO2 97% Physical Exam  Nursing note and vitals reviewed. Constitutional: She is oriented to person, place, and time. She appears well-developed and well-nourished. She appears distressed.  HENT:  Right Ear: External ear normal.  Left Ear: External ear normal.  Mouth/Throat: Oropharynx is clear and moist and mucous membranes are normal.    Neck: Normal range of motion. Neck supple.  Lymphadenopathy:    She has no cervical adenopathy.  Neurological: She is alert and oriented to person, place, and time.  Skin: Skin is warm and dry.    ED Course  Procedures (including critical care time) Labs Review Labs Reviewed - No data to display  Imaging Review No results found.   MDM   1. Pain, dental        Billy Fischer, MD 12/07/13 [phone]

## 2013-12-07 NOTE — Discharge Instructions (Signed)
Take medicine as prescribed, see your dentist as soon as possible °

## 2013-12-07 NOTE — ED Notes (Signed)
Patient c/o upper right dental abscess onset 2 days ago. Patient reports she tried to call her dental office but got no response. Patient is alert and oriented and in no acute distress.

## 2013-12-16 ENCOUNTER — Other Ambulatory Visit: Payer: Self-pay | Admitting: Gynecology

## 2013-12-16 DIAGNOSIS — Z1231 Encounter for screening mammogram for malignant neoplasm of breast: Secondary | ICD-10-CM

## 2013-12-22 ENCOUNTER — Ambulatory Visit (HOSPITAL_COMMUNITY)
Admission: RE | Admit: 2013-12-22 | Discharge: 2013-12-22 | Disposition: A | Discharge status: Home / Self Care | Payer: Federal, State, Local not specified - PPO | Source: Ambulatory Visit | Attending: Gynecology | Admitting: Gynecology

## 2013-12-22 DIAGNOSIS — Z1231 Encounter for screening mammogram for malignant neoplasm of breast: Secondary | ICD-10-CM | POA: Insufficient documentation

## 2014-03-09 ENCOUNTER — Encounter (HOSPITAL_COMMUNITY): Payer: Self-pay | Admitting: Emergency Medicine

## 2015-01-07 ENCOUNTER — Other Ambulatory Visit (HOSPITAL_COMMUNITY): Payer: Self-pay | Admitting: Family Medicine

## 2015-01-07 DIAGNOSIS — Z1231 Encounter for screening mammogram for malignant neoplasm of breast: Secondary | ICD-10-CM

## 2015-01-12 ENCOUNTER — Ambulatory Visit (HOSPITAL_COMMUNITY)
Admission: RE | Admit: 2015-01-12 | Discharge: 2015-01-12 | Disposition: A | Discharge status: Home / Self Care | Payer: Federal, State, Local not specified - PPO | Source: Ambulatory Visit | Attending: Family Medicine | Admitting: Family Medicine

## 2015-01-12 DIAGNOSIS — Z1231 Encounter for screening mammogram for malignant neoplasm of breast: Secondary | ICD-10-CM | POA: Diagnosis not present

## 2015-06-30 ENCOUNTER — Emergency Department (HOSPITAL_COMMUNITY)
Admission: EM | Admit: 2015-06-30 | Discharge: 2015-07-01 | Disposition: A | Discharge status: Home / Self Care | Payer: Federal, State, Local not specified - PPO | Attending: Emergency Medicine | Admitting: Emergency Medicine

## 2015-06-30 ENCOUNTER — Encounter (HOSPITAL_COMMUNITY): Payer: Self-pay | Admitting: Emergency Medicine

## 2015-06-30 ENCOUNTER — Emergency Department (HOSPITAL_COMMUNITY): Payer: Federal, State, Local not specified - PPO

## 2015-06-30 DIAGNOSIS — K625 Hemorrhage of anus and rectum: Secondary | ICD-10-CM | POA: Diagnosis present

## 2015-06-30 DIAGNOSIS — Z792 Long term (current) use of antibiotics: Secondary | ICD-10-CM | POA: Insufficient documentation

## 2015-06-30 DIAGNOSIS — K922 Gastrointestinal hemorrhage, unspecified: Secondary | ICD-10-CM | POA: Insufficient documentation

## 2015-06-30 DIAGNOSIS — Z79899 Other long term (current) drug therapy: Secondary | ICD-10-CM | POA: Insufficient documentation

## 2015-06-30 DIAGNOSIS — Z8739 Personal history of other diseases of the musculoskeletal system and connective tissue: Secondary | ICD-10-CM | POA: Diagnosis not present

## 2015-06-30 DIAGNOSIS — Z791 Long term (current) use of non-steroidal anti-inflammatories (NSAID): Secondary | ICD-10-CM | POA: Diagnosis not present

## 2015-06-30 DIAGNOSIS — I10 Essential (primary) hypertension: Secondary | ICD-10-CM | POA: Insufficient documentation

## 2015-06-30 DIAGNOSIS — K5731 Diverticulosis of large intestine without perforation or abscess with bleeding: Secondary | ICD-10-CM

## 2015-06-30 DIAGNOSIS — N83202 Unspecified ovarian cyst, left side: Secondary | ICD-10-CM | POA: Diagnosis not present

## 2015-06-30 DIAGNOSIS — J45909 Unspecified asthma, uncomplicated: Secondary | ICD-10-CM | POA: Insufficient documentation

## 2015-06-30 HISTORY — DX: Unspecified asthma, uncomplicated: J45.909

## 2015-06-30 LAB — COMPREHENSIVE METABOLIC PANEL
ALT: 15 U/L (ref 14–54)
AST: 20 U/L (ref 15–41)
Albumin: 3.8 g/dL (ref 3.5–5.0)
Alkaline Phosphatase: 77 U/L (ref 38–126)
Anion gap: 9 (ref 5–15)
BUN: 12 mg/dL (ref 6–20)
CO2: 25 mmol/L (ref 22–32)
Calcium: 10 mg/dL (ref 8.9–10.3)
Chloride: 108 mmol/L (ref 101–111)
Creatinine, Ser: 0.98 mg/dL (ref 0.44–1.00)
GFR calc Af Amer: >60 mL/min (ref >60)
GFR calc non Af Amer: 55 mL/min — ABNORMAL LOW (ref >60)
Glucose, Bld: 110 mg/dL — ABNORMAL HIGH (ref 65–99)
Potassium: 3.8 mmol/L (ref 3.5–5.1)
Sodium: 142 mmol/L (ref 135–145)
Total Bilirubin: 0.5 mg/dL (ref 0.3–1.2)
Total Protein: 7.8 g/dL (ref 6.5–8.1)

## 2015-06-30 LAB — URINALYSIS, ROUTINE W REFLEX MICROSCOPIC
Glucose, UA: NEGATIVE mg/dL
Hgb urine dipstick: NEGATIVE
Ketones, ur: NEGATIVE mg/dL
Leukocytes, UA: NEGATIVE
Nitrite: NEGATIVE
Protein, ur: NEGATIVE mg/dL
Specific Gravity, Urine: 1.025 (ref 1.005–1.030)
pH: 5.5 (ref 5.0–8.0)

## 2015-06-30 LAB — TYPE AND SCREEN
ABO/RH(D): O POS
Antibody Screen: NEGATIVE

## 2015-06-30 LAB — CBC
HCT: 43.5 % (ref 36.0–46.0)
Hemoglobin: 14.4 g/dL (ref 12.0–15.0)
MCH: 29.6 pg (ref 26.0–34.0)
MCHC: 33.1 g/dL (ref 30.0–36.0)
MCV: 89.5 fL (ref 78.0–100.0)
Platelets: 261 10*3/uL (ref 150–400)
RBC: 4.86 MIL/uL (ref 3.87–5.11)
RDW: 14.1 % (ref 11.5–15.5)
WBC: 8.8 10*3/uL (ref 4.0–10.5)

## 2015-06-30 LAB — ABO/RH: ABO/RH(D): O POS

## 2015-06-30 MED ORDER — SODIUM CHLORIDE 0.9 % IV BOLUS (SEPSIS)
500.0000 mL | Freq: Once | INTRAVENOUS | Status: AC
  Administered 2015-06-30: 500 mL via INTRAVENOUS

## 2015-06-30 MED ORDER — IOHEXOL 300 MG/ML  SOLN
100.0000 mL | Freq: Once | INTRAMUSCULAR | Status: AC | PRN
  Administered 2015-06-30: 100 mL via INTRAVENOUS

## 2015-06-30 NOTE — ED Provider Notes (Signed)
CSN: [id number]     Arrival date & time 06/30/15  1020 History   First MD Initiated Contact with Patient 06/30/15 1603     Chief Complaint  Patient presents with  . Rectal Bleeding     (Consider location/radiation/quality/duration/timing/severity/associated sxs/prior Treatment) HPI Comments: Pt comes in with cc of BRBPR. Symptoms x 1 week. No diarrhea. Pt has seen PCP, and has GI f/u. No melena. Pt came to the ER due to new abd pain, lower quadrants that started last night. The pain is crampy.   ROS 10 Systems reviewed and are negative for acute change except as noted in the HPI.     Patient is a 75 y.o. female presenting with hematochezia. The history is provided by the patient.  Rectal Bleeding   Past Medical History  Diagnosis Date  . Breast cyst   . Osteopenia   . Hypertension   . Asthma    Past Surgical History  Procedure Laterality Date  . Breast surgery      biopsy-cyst   Family History  Problem Relation Age of Onset  . Breast cancer Mother 63  . Heart disease Father 24    MI   Social History  Substance Use Topics  . Smoking status: Never Smoker   . Smokeless tobacco: Never Used  . Alcohol Use: Yes     Comment: wine occassionally   OB History    Gravida Para Term Preterm AB TAB SAB Ectopic Multiple Living   4 3 3  1     3      Review of Systems  Gastrointestinal: Positive for hematochezia.      Allergies  Review of patient's allergies indicates no known allergies.  Home Medications   Prior to Admission medications   Medication Sig Start Date End Date Taking? Authorizing Provider  albuterol (PROVENTIL HFA;VENTOLIN HFA) 108 (90 BASE) MCG/ACT inhaler Inhale 1-2 puffs into the lungs every 6 (six) hours as needed for wheezing or shortness of breath. 08/22/13  Yes Audelia Hives Presson, PA  fexofenadine (ALLEGRA) 180 MG tablet Take 180 mg by mouth daily.   Yes Historical Provider, MD  LORazepam (ATIVAN) 1 MG tablet Take 1 mg by mouth daily as  needed for anxiety or sleep.  04/20/15  Yes Historical Provider, MD  ranitidine (ZANTAC) 150 MG capsule Take 150 mg by mouth 3 (three) times daily with meals. 04/06/15  Yes Historical Provider, MD  telmisartan-hydrochlorothiazide (MICARDIS HCT) 80-25 MG tablet Take 1 tablet by mouth daily. 06/17/15  Yes Historical Provider, MD  benzonatate (TESSALON) 100 MG capsule Take 1 capsule (100 mg total) by mouth every 8 (eight) hours. 08/22/13   Audelia Hives Presson, PA  clindamycin (CLEOCIN) 300 MG capsule Take 1 capsule (300 mg total) by mouth 3 (three) times daily. 12/07/13   Billy Fischer, MD  darifenacin (ENABLEX) 7.5 MG 24 hr tablet Take one pill twice a day 01/22/12   Bennetta Laos, MD  diclofenac (CATAFLAM) 50 MG tablet Take 1 tablet (50 mg total) by mouth 3 (three) times daily. For dental pain 12/07/13   Billy Fischer, MD  predniSONE (DELTASONE) 10 MG tablet Beginning 08-23-2013, take 5 tabs po QD day 1, 4 tabs po QD day 2, 3 tabs po QD day 3, 2 tabs po QD day 4, 1 tab po QD day 5 then stop 08/22/13   Lutricia Feil, PA  triamcinolone cream (KENALOG) 0.1 % Apply to affected area BID 11/23/12   Janne Napoleon, NP  BP 136/64 mmHg  Pulse 69  Temp(Src) 97.9 F (36.6 C) (Oral)  Resp 20  SpO2 98% Physical Exam  Constitutional: She is oriented to person, place, and time. She appears well-developed.  HENT:  Head: Normocephalic and atraumatic.  Eyes: EOM are normal.  Neck: Normal range of motion. Neck supple.  Cardiovascular: Normal rate.   Pulmonary/Chest: Effort normal.  Abdominal: Bowel sounds are normal. There is tenderness.  LLQ tenderness  Neurological: She is alert and oriented to person, place, and time.  Skin: Skin is warm and dry.  Nursing note and vitals reviewed.   ED Course  Procedures (including critical care time) Labs Review Labs Reviewed  COMPREHENSIVE METABOLIC PANEL - Abnormal; Notable for the following:    Glucose, Bld 110 (*)    GFR calc non Af Amer 55 (*)    All  other components within normal limits  URINALYSIS, ROUTINE W REFLEX MICROSCOPIC (NOT AT [facility]) - Abnormal; Notable for the following:    Color, Urine AMBER (*)    APPearance CLOUDY (*)    Bilirubin Urine SMALL (*)    All other components within normal limits  URINE CULTURE  CBC  POC OCCULT BLOOD, ED  TYPE AND SCREEN  ABO/RH    Imaging Review Ct Abdomen Pelvis W Contrast  06/30/2015  CLINICAL DATA:  Left lower quadrant abdominal pain EXAM: CT ABDOMEN AND PELVIS WITH CONTRAST TECHNIQUE: Multidetector CT imaging of the abdomen and pelvis was performed using the standard protocol following bolus administration of intravenous contrast. CONTRAST:  182mL OMNIPAQUE IOHEXOL 300 MG/ML  SOLN COMPARISON:  None. FINDINGS: Lower chest:  Unremarkable. Hepatobiliary: No focal abnormality within the liver parenchyma. Stones are identified in the gallbladder. No intrahepatic or extrahepatic biliary dilation. Pancreas: No focal mass lesion. No dilatation of the main duct. No intraparenchymal cyst. No peripancreatic edema. Spleen: No splenomegaly. No focal mass lesion. Adrenals/Urinary Tract: 11 mm left adrenal nodule has relative washout percentage compatible with benign adrenal adenoma 10 mm right adrenal nodule cannot be definitively characterized but is also likely an adenoma. Kidneys are unremarkable. No evidence for hydroureter. The urinary bladder appears normal for the degree of distention. Stomach/Bowel: Small a moderate hiatal hernia noted. Duodenum is normally positioned as is the ligament of Treitz. No small bowel wall thickening. No small bowel dilatation. The terminal ileum is normal. The appendix is normal. Diverticular changes are noted in the left colon without evidence of diverticulitis. Vascular/Lymphatic: There is abdominal aortic atherosclerosis without aneurysm. There is no gastrohepatic or hepatoduodenal ligament lymphadenopathy. No intraperitoneal or retroperitoneal lymphadenopathy. No pelvic  sidewall lymphadenopathy. Reproductive: Calcified fibroid identified in the uterus. 2.2 cm left ovarian cyst is evident. Right ovary is normal in appearance. Other: No intraperitoneal free fluid. Musculoskeletal: Bone windows reveal no worrisome lytic or sclerotic osseous lesions. Small umbilical hernia contains only fat. IMPRESSION: 1. 2.2 cm left ovarian cyst. In a postmenopausal female, a cyst of this size on CT warrants further ultrasound evaluation. This recommendation follows ACR consensus guidelines: White Paper of the ACR Incidental Findings Committee II on Adnexal Findings. J Am Coll Radiol 2013:10:675-681. 2. Left colonic diverticulosis without diverticulitis. 3. Cholelithiasis. 4. Abdominal aortic atherosclerosis. 5. 11 mm left adrenal adenoma. 10 mm nodule in the right adrenal gland cannot be definitively characterize but is also likely an adenoma. 6. Small umbilical hernia contains only fat. Electronically Signed   By: Misty Stanley M.D.   On: 06/30/2015 22:30   I have personally reviewed and evaluated these images and lab results as  part of my medical decision-making.   EKG Interpretation None      MDM   Final diagnoses:  Acute lower GI bleeding  Diverticular hemorrhage  Cyst of left ovary    DDx includes: Esophagitis Mallory Weiss tear PUD/Gastritis/ulcers Diverticular bleed Colon cancer Rectal bleed Internal hemorrhoids External hemorrhoids  Painless bleed, BRBPR - likely diverticulosis, internal hemorrhoids. Hb is stable - really no reason to do a digital rectal exam as it wouldn't change the plan -we dont think she has an upper GI bleed Neg Colonoscopy 5 years ago per pt. Now having LLQ pain. Will get Ct r/o diverticulitis or other etiology. She has a GI f/u.  11:48 PM [place] recommended per CT - and pt informed of it. She prefers outpatient [place]. No GI bleed here. Will d.c.    Varney Biles, MD 06/30/15 2350

## 2015-06-30 NOTE — ED Notes (Signed)
Pt from home for eval of rectal bleeding with lower abd pain x1 week, pt states some emesis but denies any hemoptysis. Pt alert and oriented, states some bright red blood in stool. Tried to get refferal from PCP but yhas not gotten one.

## 2015-06-30 NOTE — Discharge Instructions (Signed)
Please see the GI doctor as planned. For the pain - as discussed you have an ovarian cyst that needs further investigation via an ultrasound - you have elected to have your primary care doctor order the study, so we recommend that you see them soon for the ultrasound.  Ovarian Cyst An ovarian cyst is a fluid-filled sac that forms on an ovary. The ovaries are small organs that produce eggs in women. Various types of cysts can form on the ovaries. Most are not cancerous. Many do not cause problems, and they often go away on their own. Some may cause symptoms and require treatment. Common types of ovarian cysts include:  Functional cysts--These cysts may occur every month during the menstrual cycle. This is normal. The cysts usually go away with the next menstrual cycle if the woman does not get pregnant. Usually, there are no symptoms with a functional cyst.  Endometrioma cysts--These cysts form from the tissue that lines the uterus. They are also called "chocolate cysts" because they become filled with blood that turns brown. This type of cyst can cause pain in the lower abdomen during intercourse and with your menstrual period.  Cystadenoma cysts--This type develops from the cells on the outside of the ovary. These cysts can get very big and cause lower abdomen pain and pain with intercourse. This type of cyst can twist on itself, cut off its blood supply, and cause severe pain. It can also easily rupture and cause a lot of pain.  Dermoid cysts--This type of cyst is sometimes found in both ovaries. These cysts may contain different kinds of body tissue, such as skin, teeth, hair, or cartilage. They usually do not cause symptoms unless they get very big.  Theca lutein cysts--These cysts occur when too much of a certain hormone (human chorionic gonadotropin) is produced and overstimulates the ovaries to produce an egg. This is most common after procedures used to assist with the conception of a baby (in  vitro fertilization). CAUSES   Fertility drugs can cause a condition in which multiple large cysts are formed on the ovaries. This is called ovarian hyperstimulation syndrome.  A condition called polycystic ovary syndrome can cause hormonal imbalances that can lead to nonfunctional ovarian cysts. SIGNS AND SYMPTOMS  Many ovarian cysts do not cause symptoms. If symptoms are present, they may include:  Pelvic pain or pressure.  Pain in the lower abdomen.  Pain during sexual intercourse.  Increasing girth (swelling) of the abdomen.  Abnormal menstrual periods.  Increasing pain with menstrual periods.  Stopping having menstrual periods without being pregnant. DIAGNOSIS  These cysts are commonly found during a routine or annual pelvic exam. Tests may be ordered to find out more about the cyst. These tests may include:  Ultrasound.  X-ray of the pelvis.  CT scan.  MRI.  Blood tests. TREATMENT  Many ovarian cysts go away on their own without treatment. Your health care provider may want to check your cyst regularly for 2-3 months to see if it changes. For women in menopause, it is particularly important to monitor a cyst closely because of the higher rate of ovarian cancer in menopausal women. When treatment is needed, it may include any of the following:  A procedure to drain the cyst (aspiration). This may be done using a long needle and ultrasound. It can also be done through a laparoscopic procedure. This involves using a thin, lighted tube with a tiny camera on the end (laparoscope) inserted through a small incision.  Surgery to remove the whole cyst. This may be done using laparoscopic surgery or an open surgery involving a larger incision in the lower abdomen.  Hormone treatment or birth control pills. These methods are sometimes used to help dissolve a cyst. HOME CARE INSTRUCTIONS   Only take over-the-counter or prescription medicines as directed by your health care  provider.  Follow up with your health care provider as directed.  Get regular pelvic exams and Pap tests. SEEK MEDICAL CARE IF:   Your periods are late, irregular, or painful, or they stop.  Your pelvic pain or abdominal pain does not go away.  Your abdomen becomes larger or swollen.  You have pressure on your bladder or trouble emptying your bladder completely.  You have pain during sexual intercourse.  You have feelings of fullness, pressure, or discomfort in your stomach.  You lose weight for no apparent reason.  You feel generally ill.  You become constipated.  You lose your appetite.  You develop acne.  You have an increase in body and facial hair.  You are gaining weight, without changing your exercise and eating habits.  You think you are pregnant. SEEK IMMEDIATE MEDICAL CARE IF:   You have increasing abdominal pain.  You feel sick to your stomach (nauseous), and you throw up (vomit).  You develop a fever that comes on suddenly.  You have abdominal pain during a bowel movement.  Your menstrual periods become heavier than usual. MAKE SURE YOU:  Understand these instructions.  Will watch your condition.  Will get help right away if you are not doing well or get worse.   This information is not intended to replace advice given to you by your health care provider. Make sure you discuss any questions you have with your health care provider.   Document Released: 04/24/2005 Document Revised: 04/29/2013 Document Reviewed: 12/30/2012 Elsevier Interactive Patient Education 2016 Elsevier Inc.   Gastrointestinal Bleeding Gastrointestinal (GI) bleeding means there is bleeding somewhere along the digestive tract, between the mouth and anus. CAUSES  There are many different problems that can cause GI bleeding. Possible causes include:  Esophagitis. This is inflammation, irritation, or swelling of the esophagus.  Hemorrhoids.These are veins that are full of  blood (engorged) in the rectum. They cause pain, inflammation, and may bleed.  Anal fissures.These are areas of painful tearing which may bleed. They are often caused by passing hard stool.  Diverticulosis.These are pouches that form on the colon over time, with age, and may bleed significantly.  Diverticulitis.This is inflammation in areas with diverticulosis. It can cause pain, fever, and bloody stools, although bleeding is rare.  Polyps and cancer. Colon cancer often starts out as precancerous polyps.  Gastritis and ulcers.Bleeding from the upper gastrointestinal tract (near the stomach) may travel through the intestines and produce black, sometimes tarry, often bad smelling stools. In certain cases, if the bleeding is fast enough, the stools may not be black, but red. This condition may be life-threatening. SYMPTOMS   Vomiting bright red blood or material that looks like coffee grounds.  Bloody, black, or tarry stools. DIAGNOSIS  Your caregiver may diagnose your condition by taking your history and performing a physical exam. More tests may be needed, including:  X-rays and other imaging tests.  Esophagogastroduodenoscopy (EGD). This test uses a flexible, lighted tube to look at your esophagus, stomach, and small intestine.  Colonoscopy. This test uses a flexible, lighted tube to look at your colon. TREATMENT  Treatment depends on the cause of  your bleeding.   For bleeding from the esophagus, stomach, small intestine, or colon, the caregiver doing your EGD or colonoscopy may be able to stop the bleeding as part of the procedure.  Inflammation or infection of the colon can be treated with medicines.  Many rectal problems can be treated with creams, suppositories, or warm baths.  Surgery is sometimes needed.  Blood transfusions are sometimes needed if you have lost a lot of blood. If bleeding is slow, you may be allowed to go home. If there is a lot of bleeding, you will  need to stay in the hospital for observation. HOME CARE INSTRUCTIONS   Take any medicines exactly as prescribed.  Keep your stools soft by eating foods that are high in fiber. These foods include whole grains, legumes, fruits, and vegetables. Prunes (1 to 3 a day) work well for many people.  Drink enough fluids to keep your urine clear or pale yellow. SEEK IMMEDIATE MEDICAL CARE IF:   Your bleeding increases.  You feel lightheaded, weak, or you faint.  You have severe cramps in your back or abdomen.  You pass large blood clots in your stool.  Your problems are getting worse. MAKE SURE YOU:   Understand these instructions.  Will watch your condition.  Will get help right away if you are not doing well or get worse.   This information is not intended to replace advice given to you by your health care provider. Make sure you discuss any questions you have with your health care provider.   Document Released: 04/21/2000 Document Revised: 04/10/2012 Document Reviewed: 10/12/2014 Elsevier Interactive Patient Education 2016 [employer]. Diverticulosis Diverticulosis is the condition that develops when small pouches (diverticula) form in the wall of your colon. Your colon, or large intestine, is where water is absorbed and stool is formed. The pouches form when the inside layer of your colon pushes through weak spots in the outer layers of your colon. CAUSES  No one knows exactly what causes diverticulosis. RISK FACTORS  Being older than 23. Your risk for this condition increases with age. Diverticulosis is rare in people younger than 40 years. By age 27, almost everyone has it.  Eating a low-fiber diet.  Being frequently constipated.  Being overweight.  Not getting enough exercise.  Smoking.  Taking over-the-counter pain medicines, like aspirin and ibuprofen. SYMPTOMS  Most people with diverticulosis do not have symptoms. DIAGNOSIS  Because diverticulosis often has no  symptoms, health care providers often discover the condition during an exam for other colon problems. In many cases, a health care provider will diagnose diverticulosis while using a flexible scope to examine the colon (colonoscopy). TREATMENT  If you have never developed an infection related to diverticulosis, you may not need treatment. If you have had an infection before, treatment may include:  Eating more fruits, vegetables, and grains.  Taking a fiber supplement.  Taking a live bacteria supplement (probiotic).  Taking medicine to relax your colon. HOME CARE INSTRUCTIONS   Drink at least 6-8 glasses of water each day to prevent constipation.  Try not to strain when you have a bowel movement.  Keep all follow-up appointments. If you have had an infection before:  Increase the fiber in your diet as directed by your health care provider or dietitian.  Take a dietary fiber supplement if your health care provider approves.  Only take medicines as directed by your health care provider. SEEK MEDICAL CARE IF:   You have abdominal pain.  You  have bloating.  You have cramps.  You have not gone to the bathroom in 3 days. SEEK IMMEDIATE MEDICAL CARE IF:   Your pain gets worse.  Yourbloating becomes very bad.  You have a fever or chills, and your symptoms suddenly get worse.  You begin vomiting.  You have bowel movements that are bloody or black. MAKE SURE YOU:  Understand these instructions.  Will watch your condition.  Will get help right away if you are not doing well or get worse.   This information is not intended to replace advice given to you by your health care provider. Make sure you discuss any questions you have with your health care provider.   Document Released: 01/20/2004 Document Revised: 04/29/2013 Document Reviewed: 03/19/2013 Elsevier Interactive Patient Education [employer].

## 2015-06-30 NOTE — ED Notes (Signed)
Patient transported to CT 

## 2015-07-01 LAB — URINE CULTURE

## 2015-07-08 DIAGNOSIS — Z1211 Encounter for screening for malignant neoplasm of colon: Secondary | ICD-10-CM | POA: Diagnosis not present

## 2015-07-08 DIAGNOSIS — K429 Umbilical hernia without obstruction or gangrene: Secondary | ICD-10-CM | POA: Diagnosis not present

## 2015-07-08 DIAGNOSIS — K8080 Other cholelithiasis without obstruction: Secondary | ICD-10-CM | POA: Diagnosis not present

## 2015-07-08 DIAGNOSIS — K625 Hemorrhage of anus and rectum: Secondary | ICD-10-CM | POA: Diagnosis not present

## 2015-07-13 ENCOUNTER — Ambulatory Visit: Payer: Self-pay | Admitting: Gynecology

## 2015-07-20 ENCOUNTER — Ambulatory Visit (INDEPENDENT_AMBULATORY_CARE_PROVIDER_SITE_OTHER): Payer: Federal, State, Local not specified - PPO | Admitting: Gynecology

## 2015-07-20 ENCOUNTER — Encounter: Payer: Self-pay | Admitting: Gynecology

## 2015-07-20 VITALS — BP 118/74 | Ht 63.0 in | Wt 251.0 lb

## 2015-07-20 DIAGNOSIS — M8580 Other specified disorders of bone density and structure, unspecified site: Secondary | ICD-10-CM | POA: Diagnosis not present

## 2015-07-20 DIAGNOSIS — N952 Postmenopausal atrophic vaginitis: Secondary | ICD-10-CM

## 2015-07-20 DIAGNOSIS — N83202 Unspecified ovarian cyst, left side: Secondary | ICD-10-CM | POA: Diagnosis not present

## 2015-07-20 DIAGNOSIS — Z124 Encounter for screening for malignant neoplasm of cervix: Secondary | ICD-10-CM

## 2015-07-20 DIAGNOSIS — Z01419 Encounter for gynecological examination (general) (routine) without abnormal findings: Secondary | ICD-10-CM

## 2015-07-20 NOTE — Patient Instructions (Signed)
Follow up for bone density and ultrasound appointment as scheduled.

## 2015-07-20 NOTE — Addendum Note (Signed)
Addended by: Nelva Nay on: 07/20/2015 09:46 AM   Modules accepted: Orders

## 2015-07-20 NOTE — Progress Notes (Signed)
    Kimberly Crosby December 12, 1940 [id number]        74 y.o.  [id number]  for breast and pelvic exam. Several issues noted below.  Former patient of Dr. Cherylann Banas. Has not been in the office for over 3 years.  Past medical history,surgical history, problem list, medications, allergies, family history and social history were all reviewed and documented as reviewed in the EPIC chart.  ROS:  Performed with pertinent positives and negatives included in the history, assessment and plan.   Additional significant findings :  none   Exam: Kimberly Crosby assistant Filed Vitals:   07/20/15 0847  BP: 118/74  Height: 5\' 3"  (1.6 m)  Weight: 251 lb (113.853 kg)   General appearance:  Normal affect, orientation and appearance. Skin: Grossly normal HEENT: Without gross lesions.  No cervical or supraclavicular adenopathy. Thyroid normal.  Lungs:  Clear without wheezing, rales or rhonchi Cardiac: RR, without RMG Abdominal:  Soft, nontender, without masses, guarding, rebound, organomegaly or hernia Breasts:  Examined lying and sitting without masses, retractions, discharge or axillary adenopathy. Pelvic:  Ext/BUS/vagina with atrophic changes  Cervix with atrophic changes. Pap smear done  Uterus difficult to palpate due to abdominal girth but no gross masses or tenderness  Adnexa without gross masses or tenderness    Anus and perineum normal   Rectovaginal normal sphincter tone without palpated masses or tenderness.    Assessment/Plan:  75 y.o. [id number] female for breast and pelvic exam.   1. Recent evaluation for abdominal pain. CT scan showed 2.2 cm left ovarian cyst. She does have a history of ultrasound 2012 by Dr. Cherylann Banas that showed a 15 mm mean left ovarian avascular cyst and questionable small endometrial polyp. She's done no bleeding. Recommend sonohysterogram now for follow up of the ovarian cyst and endometrial assessment. Patient agrees to schedule follow up for this. 2. Osteopenia. DEXA 2011 T  score -1.8 FRAX 4%/0.5%. Follow up DEXA now and patient will schedule. 3. Mammography 01/2015. Continue with annual mammography when due. SBE monthly reviewed. 4. Pap smear 2011. Pap smear done today. No history of significant abnormal Pap smears previously. 5. Colonoscopy scheduled this Friday and she'll follow up for this. 6. Health maintenance. No routine lab work done as this is done at her primary physician's office. Follow up 1 year, sooner as needed.   Anastasio Auerbach MD, 9:27 AM 07/20/2015

## 2015-07-21 LAB — PAP IG W/ RFLX HPV ASCU

## 2015-07-22 ENCOUNTER — Other Ambulatory Visit: Payer: Self-pay | Admitting: Gynecology

## 2015-07-22 DIAGNOSIS — N9489 Other specified conditions associated with female genital organs and menstrual cycle: Secondary | ICD-10-CM

## 2015-07-22 DIAGNOSIS — N83209 Unspecified ovarian cyst, unspecified side: Secondary | ICD-10-CM

## 2015-07-23 DIAGNOSIS — K5730 Diverticulosis of large intestine without perforation or abscess without bleeding: Secondary | ICD-10-CM | POA: Diagnosis not present

## 2015-07-23 DIAGNOSIS — Z1211 Encounter for screening for malignant neoplasm of colon: Secondary | ICD-10-CM | POA: Diagnosis not present

## 2015-07-23 DIAGNOSIS — D175 Benign lipomatous neoplasm of intra-abdominal organs: Secondary | ICD-10-CM | POA: Diagnosis not present

## 2015-07-23 DIAGNOSIS — K635 Polyp of colon: Secondary | ICD-10-CM | POA: Diagnosis not present

## 2015-07-23 DIAGNOSIS — D124 Benign neoplasm of descending colon: Secondary | ICD-10-CM | POA: Diagnosis not present

## 2015-07-27 ENCOUNTER — Telehealth: Payer: Self-pay | Admitting: Gynecology

## 2015-07-27 NOTE — Telephone Encounter (Signed)
07/27/15-I spoke w/pt after calling [employer] for her benefits. I was told as of 05/09/15 she has Medicare A & B primary and they pay secondary. Pt states she doesn't have a Medicare Card and doesn't have Medicare. I told her we would not collect any money up front and that [facility] can refuse to pay if she doesn't get the Medicare information. Per Darlene A @ BC-Ref#[id number]

## 2015-08-04 ENCOUNTER — Other Ambulatory Visit: Payer: Self-pay | Admitting: Gynecology

## 2015-08-04 ENCOUNTER — Encounter: Payer: Self-pay | Admitting: Gynecology

## 2015-08-04 ENCOUNTER — Ambulatory Visit (INDEPENDENT_AMBULATORY_CARE_PROVIDER_SITE_OTHER): Payer: Federal, State, Local not specified - PPO

## 2015-08-04 ENCOUNTER — Ambulatory Visit (INDEPENDENT_AMBULATORY_CARE_PROVIDER_SITE_OTHER): Payer: Federal, State, Local not specified - PPO | Admitting: Gynecology

## 2015-08-04 VITALS — BP 122/80

## 2015-08-04 DIAGNOSIS — N83202 Unspecified ovarian cyst, left side: Secondary | ICD-10-CM

## 2015-08-04 DIAGNOSIS — R938 Abnormal findings on diagnostic imaging of other specified body structures: Secondary | ICD-10-CM

## 2015-08-04 DIAGNOSIS — D251 Intramural leiomyoma of uterus: Secondary | ICD-10-CM | POA: Diagnosis not present

## 2015-08-04 DIAGNOSIS — N949 Unspecified condition associated with female genital organs and menstrual cycle: Secondary | ICD-10-CM

## 2015-08-04 DIAGNOSIS — R9389 Abnormal findings on diagnostic imaging of other specified body structures: Secondary | ICD-10-CM

## 2015-08-04 DIAGNOSIS — N83209 Unspecified ovarian cyst, unspecified side: Secondary | ICD-10-CM

## 2015-08-04 DIAGNOSIS — N9489 Other specified conditions associated with female genital organs and menstrual cycle: Secondary | ICD-10-CM

## 2015-08-04 NOTE — Progress Notes (Signed)
    Kimberly Crosby 19-Nov-1940 [id number]        75 y.o.  [id number] presents for sonohysterogram due to recent CT scan for abdominal pain showing a 2.2 cm left ovarian cyst and history of an ultrasound 2012 by Dr. Cherylann Banas which showed a questionable small myometrial polyp and a left ovarian avascular cyst of 15 mm. Patients done no bleeding and not having any pelvic pain.  Past medical history,surgical history, problem list, medications, allergies, family history and social history were all reviewed and documented in the EPIC chart.  Directed ROS with pertinent positives and negatives documented in the history of present illness/assessment and plan.  Exam: [place] assistant Filed Vitals:   08/04/15 1240  BP: 122/80   General appearance:  Normal Abdomen obese, soft nontender without gross masses  Pelvic external BUS vagina with atrophic changes. Cervix atrophic. Uterus unable to palpate but no gross masses or tenderness. Adnexa without gross masses or tenderness  Ultrasound shows uterus normal size with several small myomas 27 mm, 22 mm. Endometrial echo 5.8 mm. Right ovary normal. Left ovary not seen but thin-walled echo-free left adnexal avascular cyst 30 mm mean. Cul-de-sac negative  Sonohysterogram attempted under sterile technique initially unable to pass the catheter due to cervical stenosis. Single tooth tenaculum anterior lip stabilization with several attempts at dilating with the smallest dilators unable to negotiate the canal. Procedure was aborted without specimen.  Assessment/Plan:  75 y.o. [id number] with history as above. Small left adnexal simple cyst avascular. Reviewed with patient risk of malignancy low but not 0. Options for management to include expectant management with follow up ultrasound in 6 months versus surgical intervention discussed. Risks versus benefits reviewed and we both agree with expectant management and follow up ultrasound in 6 months. Reviewed endometrial  echo and questionable small polyp on prior ultrasound. Prior measurement 6.5 mm in 2012. 5.8 mm today. Unlikely for significant pathology given the endometrial echo is smaller. Cannot guarantee without sampling but again risks versus benefits reviewed. Possible trial of estrogen for 1-2 months and then trying to re-sound her. Risks of perforation. Again after lengthy discussion the patient's comfortable with observation and will relook at the endometrial echo in 6 months.    Anastasio Auerbach MD, 12:49 PM 08/04/2015

## 2015-08-04 NOTE — Patient Instructions (Signed)
Follow up for ultrasound in 6 months 

## 2015-08-26 ENCOUNTER — Other Ambulatory Visit: Payer: Self-pay | Admitting: Gynecology

## 2015-08-26 ENCOUNTER — Ambulatory Visit (INDEPENDENT_AMBULATORY_CARE_PROVIDER_SITE_OTHER): Payer: Federal, State, Local not specified - PPO

## 2015-08-26 DIAGNOSIS — M8580 Other specified disorders of bone density and structure, unspecified site: Secondary | ICD-10-CM

## 2015-08-26 DIAGNOSIS — M899 Disorder of bone, unspecified: Secondary | ICD-10-CM

## 2015-08-27 ENCOUNTER — Encounter: Payer: Self-pay | Admitting: Gynecology

## 2015-12-29 DIAGNOSIS — E6609 Other obesity due to excess calories: Secondary | ICD-10-CM | POA: Diagnosis not present

## 2015-12-29 DIAGNOSIS — R7309 Other abnormal glucose: Secondary | ICD-10-CM | POA: Diagnosis not present

## 2015-12-29 DIAGNOSIS — I10 Essential (primary) hypertension: Secondary | ICD-10-CM | POA: Diagnosis not present

## 2016-01-07 ENCOUNTER — Encounter: Payer: Self-pay | Admitting: Allergy

## 2016-01-07 ENCOUNTER — Ambulatory Visit (INDEPENDENT_AMBULATORY_CARE_PROVIDER_SITE_OTHER): Payer: Medicare Other | Admitting: Allergy

## 2016-01-07 VITALS — BP 128/78 | HR 60 | Temp 97.9°F | Resp 16

## 2016-01-07 DIAGNOSIS — L501 Idiopathic urticaria: Secondary | ICD-10-CM

## 2016-01-07 DIAGNOSIS — T783XXA Angioneurotic edema, initial encounter: Secondary | ICD-10-CM | POA: Insufficient documentation

## 2016-01-07 DIAGNOSIS — T783XXD Angioneurotic edema, subsequent encounter: Secondary | ICD-10-CM | POA: Diagnosis not present

## 2016-01-07 DIAGNOSIS — J3089 Other allergic rhinitis: Secondary | ICD-10-CM | POA: Diagnosis not present

## 2016-01-07 MED ORDER — RANITIDINE HCL 150 MG PO CAPS: 150.0000 mg | ORAL_CAPSULE | Freq: Two times a day (BID) | ORAL | 5 refills | Status: DC

## 2016-01-07 MED ORDER — EPINEPHRINE 0.3 MG/0.3ML IJ SOAJ: 0.3000 mg | Freq: Once | INTRAMUSCULAR | 1 refills | Status: AC

## 2016-01-07 MED ORDER — MONTELUKAST SODIUM 10 MG PO TABS: 10.0000 mg | ORAL_TABLET | Freq: Every day | ORAL | 5 refills | Status: DC

## 2016-01-07 NOTE — Patient Instructions (Signed)
Hives and swelling  - take Allegra 180mg  twice a day  - take Zantac 150mg  twice a day  - take Singulair 10mg  at bedtime  - may use benadryl 25mg  as needed for breakthrough symptoms  - will refill Epipen  Allergic rhinitis - take Allegra as above  Follow-up 2-3 months

## 2016-01-07 NOTE — Progress Notes (Signed)
Follow-up Note  RE: Kimberly Crosby MRN: [id number] DOB: [birth year] Date of Office Visit: 01/07/2016   History of present illness: Kimberly Crosby is a 75 y.o. female presenting today for follow-up of times and swelling. She was last seen in our office by Dr. Ishmael Holter in May 2016.  She is still having episodes swelling and hives .  The swelling she reports is not as bad as her original reaction. Swelling usually involves the lips and face and sometimes has involved the tongue. She denies any trouble breathing or trouble swallowing when she does have tongue swelling.  The swelling occurs about once or twice a month and usually starts overnight where she wakes up and noticed that she is swollen.  Hives are more frequent and feels she does have them once or twice a week.   They are also pressure induced.  Swelling and hives do resolve over course of the day.   She does have an EpiPen however she states it has not helped with the swelling when she has used it.  She is taking allegra 180mg  daily and Zantac she takes as needed at this time.    With her allergic rhinitis she does not have any complaints at this time.  Relevant historical results: Initial workup of urticaria and angioedema from November 2014 showed normal C1 esterase inhibitor functional level, C1q, C2, C4. Specific IgE to peanut, tre nut panel panel, flounder, tilapia, alpha gal, lamb, pork, beef all negative. Normal TSH and tryptase. Total IgE of 132.7 international units per mL. Negative ANA. H. pylori antibody IgG elevated at 2.95 ISR, IgA 58.7 units per mL, IgM 6.2 units per mL. Normal thyroid peroxidase antibody 18.4 and thyroglobulin antibody less than 20.  Review of systems: Review of Systems  Constitutional: Negative for chills and fever.  HENT: Negative for congestion and sore throat.   Eyes: Negative for redness.  Respiratory: Negative for cough, shortness of breath and wheezing.   Cardiovascular: Negative for chest pain.    Gastrointestinal: Negative for nausea and vomiting.  Skin: Positive for itching and rash.  Neurological: Negative for headaches.    All other systems negative unless noted above in HPI  Past medical/social/surgical/family history have been reviewed and are unchanged unless specifically indicated below.  No changes  Medication List:   Medication List       Accurate as of 01/07/16  1:16 PM. Always use your most recent med list.          EPIPEN 2-PAK 0.3 mg/0.3 mL Soaj injection Generic drug:  EPINEPHrine Inject 0.3 mg into the muscle once.   fexofenadine 180 MG tablet Commonly known as:  ALLEGRA Take 180 mg by mouth daily.   LORazepam 1 MG tablet Commonly known as:  ATIVAN Take 1 mg by mouth daily as needed for anxiety or sleep.   ranitidine 150 MG capsule Commonly known as:  ZANTAC Take 150 mg by mouth 3 (three) times daily with meals.   telmisartan-hydrochlorothiazide 80-25 MG tablet Commonly known as:  MICARDIS HCT Take 1 tablet by mouth daily.       Known medication allergies: No Known Allergies   Physical examination: Blood pressure 128/78, pulse 60, temperature 97.9 F (36.6 C), temperature source Oral, resp. rate 16, SpO2 97 %.  General: Alert, interactive, in no acute distress. HEENT: TMs pearly gray, turbinates minimally edematous without discharge, post-pharynx non erythematous. Neck: Supple without lymphadenopathy. Lungs: Clear to auscultation without wheezing, rhonchi or rales. {no increased work of breathing.  CV: Normal S1, S2 without murmurs. Abdomen: Nondistended, nontender. Skin: Warm and dry, without lesions or rashes. No urticarial lesions or edema noted Extremities:  No clubbing, cyanosis or edema. Neuro:   Grossly intact.  Diagnositics/Labs: Labs: Reviewed as above   Assessment and plan:   Urticaria with angioedema, chronic idiopathic  - Continues to have episodes monthly however only consistently taking Allegra daily. Will  increase her antihistamine in LTR a regimen at this time. If she continues to have refractory urticaria with angioedema on this regimen consider Xolair.  She has had an unremarkable evaluation for [facility]  - take Allegra 180mg  twice a day  - take Zantac 150mg  twice a day  - take Singulair 10mg  at bedtime  - may use benadryl 25mg  as needed for breakthrough symptoms  - will refill Epipen  Allergic rhinitis - take Allegra as above  Follow-up 2-3 months  I appreciate the opportunity to take part in [facility] care. Please do not hesitate to contact me with questions.  Sincerely,   Prudy Feeler, MD Allergy/Immunology [facility]

## 2016-01-28 ENCOUNTER — Ambulatory Visit (INDEPENDENT_AMBULATORY_CARE_PROVIDER_SITE_OTHER): Payer: Medicare Other | Admitting: Gynecology

## 2016-01-28 ENCOUNTER — Other Ambulatory Visit: Payer: Self-pay | Admitting: Gynecology

## 2016-01-28 ENCOUNTER — Encounter: Payer: Self-pay | Admitting: Gynecology

## 2016-01-28 ENCOUNTER — Ambulatory Visit (INDEPENDENT_AMBULATORY_CARE_PROVIDER_SITE_OTHER): Payer: Medicare Other

## 2016-01-28 VITALS — BP 124/76

## 2016-01-28 DIAGNOSIS — N838 Other noninflammatory disorders of ovary, fallopian tube and broad ligament: Secondary | ICD-10-CM

## 2016-01-28 DIAGNOSIS — N839 Noninflammatory disorder of ovary, fallopian tube and broad ligament, unspecified: Secondary | ICD-10-CM

## 2016-01-28 DIAGNOSIS — R938 Abnormal findings on diagnostic imaging of other specified body structures: Secondary | ICD-10-CM

## 2016-01-28 DIAGNOSIS — N83201 Unspecified ovarian cyst, right side: Secondary | ICD-10-CM

## 2016-01-28 DIAGNOSIS — N949 Unspecified condition associated with female genital organs and menstrual cycle: Secondary | ICD-10-CM

## 2016-01-28 DIAGNOSIS — D251 Intramural leiomyoma of uterus: Secondary | ICD-10-CM | POA: Diagnosis not present

## 2016-01-28 DIAGNOSIS — N83202 Unspecified ovarian cyst, left side: Secondary | ICD-10-CM

## 2016-01-28 DIAGNOSIS — R9389 Abnormal findings on diagnostic imaging of other specified body structures: Secondary | ICD-10-CM

## 2016-01-28 NOTE — Patient Instructions (Signed)
Office will call you with blood test results Follow up for ultrasound in 6 months

## 2016-01-28 NOTE — Progress Notes (Signed)
    Kimberly Crosby 12-21-1940 [id number]        75 y.o.  [id number] presents for follow up ultrasound. Had CT scan earlier in the year which showed a 2.2 cm left ovarian cyst. She had an ultrasound 2012 by Dr. Cherylann Banas which showed a 15 mm left ovarian avascular cyst and a questionable small endometrial polyp. Ultrasound following the CT scan 07/2015 showed several small myomas and endometrial echo 5.8 mm and left ovary with echo-free left avascular cyst 30 mm mean. Sonohysterogram was attempted but could not dilate the cervix and find a canal. The procedure was aborted without specimen. She has no history of vaginal bleeding.  Past medical history,surgical history, problem list, medications, allergies, family history and social history were all reviewed and documented in the EPIC chart.  Directed ROS with pertinent positives and negatives documented in the history of present illness/assessment and plan.  Exam: Vitals:   01/28/16 1049  BP: 124/76   General appearance:  Normal  Ultrasound shows uterus normal size with 2 identified small myomas 26 mm and 20 mm. Questionable endometrial cavity focus 12 x 8 mm. Endometrial echo 3.7 mm. Right ovary with small echo-free cyst 23 x 9 mm. Left adnexa with cystic mass 32 x 25 x 23 mm small adjacent calcification and smaller adjacent cyst 23 x 9 mm.  Assessment/Plan:  75 y.o. [id number] with persistent left ovarian cystic changes not dramatically changed and prior studies. Small right ovarian cystic area. Questionable endometrial polyp although endometrial echo 3.7 mm less than 5.8 mm measured in March. Reviewed possibilities up to and including cancer with the patient both from an endometrial polyp standpoint and the ovarian cysts. Unable to dilate the cervix to sample the endometrium. Options for management reviewed with the patient. Will check baseline CA-125 today. Assuming negative will follow up ultrasound in 6 months. Alternative to consider referral for  second opinion to gynecologic oncologist also discussed. Patient comfortable at this point following as there is as not been a significant change in her studies. She is done no bleeding. Patient will follow up in 6 months sooner if any issues.    Anastasio Auerbach MD, 11:12 AM 01/28/2016

## 2016-01-29 LAB — CA 125: CA 125: 5 U/mL (ref <35)

## 2016-02-14 ENCOUNTER — Ambulatory Visit (INDEPENDENT_AMBULATORY_CARE_PROVIDER_SITE_OTHER): Payer: Medicare Other | Admitting: Orthopaedic Surgery

## 2016-02-14 DIAGNOSIS — M1711 Unilateral primary osteoarthritis, right knee: Secondary | ICD-10-CM

## 2016-02-18 DIAGNOSIS — H25013 Cortical age-related cataract, bilateral: Secondary | ICD-10-CM | POA: Diagnosis not present

## 2016-02-18 DIAGNOSIS — H401122 Primary open-angle glaucoma, left eye, moderate stage: Secondary | ICD-10-CM | POA: Diagnosis not present

## 2016-02-18 DIAGNOSIS — H401112 Primary open-angle glaucoma, right eye, moderate stage: Secondary | ICD-10-CM | POA: Diagnosis not present

## 2016-02-18 DIAGNOSIS — H5213 Myopia, bilateral: Secondary | ICD-10-CM | POA: Diagnosis not present

## 2016-02-23 ENCOUNTER — Other Ambulatory Visit: Payer: Self-pay | Admitting: Gynecology

## 2016-02-23 DIAGNOSIS — Z1231 Encounter for screening mammogram for malignant neoplasm of breast: Secondary | ICD-10-CM

## 2016-03-10 DIAGNOSIS — Z0000 Encounter for general adult medical examination without abnormal findings: Secondary | ICD-10-CM | POA: Diagnosis not present

## 2016-03-13 ENCOUNTER — Ambulatory Visit (INDEPENDENT_AMBULATORY_CARE_PROVIDER_SITE_OTHER): Payer: Medicare Other | Admitting: Orthopaedic Surgery

## 2016-03-13 ENCOUNTER — Encounter (INDEPENDENT_AMBULATORY_CARE_PROVIDER_SITE_OTHER): Payer: Self-pay

## 2016-03-13 DIAGNOSIS — M1711 Unilateral primary osteoarthritis, right knee: Secondary | ICD-10-CM | POA: Insufficient documentation

## 2016-03-13 NOTE — Progress Notes (Signed)
   Office Visit Note   Patient: Kimberly Crosby           Date of Birth: 09-21-1940           MRN: [id number] Visit Date: 03/13/2016              Requested by: Lucianne Lei, MD [address] [place], [place] [address] PCP: Elyn Peers, MD   Assessment & Plan: Visit Diagnoses:  1. Unilateral primary osteoarthritis, right knee     Plan:  - doing well, continue exercises - mobic prn - she has mainly PF arthritis - f/u prn  Follow-Up Instructions: Return if symptoms worsen or fail to improve.   Orders:  No orders of the defined types were placed in this encounter.  No orders of the defined types were placed in this encounter.     Procedures: No procedures performed   Clinical Data: No additional findings.   Subjective: Chief Complaint  Patient presents with  . Right Knee - Follow-up, Pain    Following up for right knee pain.  Doing much better.  Still has some pain mainly when going downstairs.  Doing exercises at the [facility].  mobic helps.    Review of Systems   Objective: Vital Signs: There were no vitals taken for this visit.  Physical Exam  Musculoskeletal:       Right knee: She exhibits no effusion.    Right Knee Exam   Range of Motion  The patient has normal right knee ROM.  Muscle Strength   The patient has normal right knee strength.  Other  Other tests: no effusion present      Specialty Comments:  No specialty comments available.  Imaging: No results found.   PMFS History: Patient Active Problem List   Diagnosis Date Noted  . Unilateral primary osteoarthritis, right knee 03/13/2016  . Chronic idiopathic urticaria 01/07/2016  . Angioedema 01/07/2016  . Other allergic rhinitis 01/07/2016  . Hypertension   . Breast cyst   . Osteopenia    Past Medical History:  Diagnosis Date  . Asthma   . Breast cyst   . Edema   . Hypertension   . Osteopenia 08/2015   T score -2.2 FRAX 5%/1.3%    Family History  Problem Relation  Age of Onset  . Breast cancer Mother 29  . Heart disease Father 28    MI  . Diabetes Brother     Past Surgical History:  Procedure Laterality Date  . BREAST SURGERY     biopsy-cyst   Social History   Occupational History  . Not on file.   Social History Main Topics  . Smoking status: Never Smoker  . Smokeless tobacco: Never Used  . Alcohol use 0.0 oz/week     Comment: wine occassionally  . Drug use: No  . Sexual activity: No     Comment: 1st intercourse 75 yo-Fewer than 5 partners

## 2016-03-16 ENCOUNTER — Ambulatory Visit: Payer: Federal, State, Local not specified - PPO

## 2016-03-16 DIAGNOSIS — H25012 Cortical age-related cataract, left eye: Secondary | ICD-10-CM | POA: Diagnosis not present

## 2016-03-16 DIAGNOSIS — H2512 Age-related nuclear cataract, left eye: Secondary | ICD-10-CM | POA: Diagnosis not present

## 2016-03-16 DIAGNOSIS — H25812 Combined forms of age-related cataract, left eye: Secondary | ICD-10-CM | POA: Diagnosis not present

## 2016-04-06 DIAGNOSIS — H25011 Cortical age-related cataract, right eye: Secondary | ICD-10-CM | POA: Diagnosis not present

## 2016-04-06 DIAGNOSIS — H25811 Combined forms of age-related cataract, right eye: Secondary | ICD-10-CM | POA: Diagnosis not present

## 2016-04-06 DIAGNOSIS — H2511 Age-related nuclear cataract, right eye: Secondary | ICD-10-CM | POA: Diagnosis not present

## 2016-06-29 ENCOUNTER — Ambulatory Visit: Payer: Federal, State, Local not specified - PPO

## 2016-07-20 ENCOUNTER — Encounter: Payer: Federal, State, Local not specified - PPO | Admitting: Gynecology

## 2016-07-28 ENCOUNTER — Other Ambulatory Visit: Payer: Federal, State, Local not specified - PPO

## 2016-07-28 ENCOUNTER — Ambulatory Visit: Payer: Medicare Other | Admitting: Gynecology

## 2016-07-30 ENCOUNTER — Other Ambulatory Visit: Payer: Self-pay | Admitting: Allergy

## 2016-07-30 DIAGNOSIS — L501 Idiopathic urticaria: Secondary | ICD-10-CM

## 2016-07-30 DIAGNOSIS — T783XXD Angioneurotic edema, subsequent encounter: Secondary | ICD-10-CM

## 2016-08-10 ENCOUNTER — Encounter: Payer: Self-pay | Admitting: Gynecology

## 2016-08-10 ENCOUNTER — Ambulatory Visit (INDEPENDENT_AMBULATORY_CARE_PROVIDER_SITE_OTHER): Payer: Medicare Other | Admitting: Gynecology

## 2016-08-10 VITALS — BP 118/76 | Ht 63.0 in | Wt 258.0 lb

## 2016-08-10 DIAGNOSIS — N952 Postmenopausal atrophic vaginitis: Secondary | ICD-10-CM

## 2016-08-10 DIAGNOSIS — Z01411 Encounter for gynecological examination (general) (routine) with abnormal findings: Secondary | ICD-10-CM

## 2016-08-10 DIAGNOSIS — M8580 Other specified disorders of bone density and structure, unspecified site: Secondary | ICD-10-CM

## 2016-08-10 DIAGNOSIS — N83202 Unspecified ovarian cyst, left side: Secondary | ICD-10-CM

## 2016-08-10 DIAGNOSIS — N83201 Unspecified ovarian cyst, right side: Secondary | ICD-10-CM

## 2016-08-10 NOTE — Progress Notes (Signed)
    Kimberly Crosby 1940-09-28 [id number]        75 y.o.  [id number] for breast and pelvic exam.  Past medical history,surgical history, problem list, medications, allergies, family history and social history were all reviewed and documented as reviewed in the EPIC chart.  ROS:  Performed with pertinent positives and negatives included in the history, assessment and plan.   Additional significant findings :  None   Exam: [place] assistant Vitals:   08/10/16 1523  BP: 118/76  Weight: 258 lb (117 kg)  Height: 5\' 3"  (1.6 m)   Body mass index is 45.7 kg/m.  General appearance:  Normal affect, orientation and appearance. Skin: Grossly normal HEENT: Without gross lesions.  No cervical or supraclavicular adenopathy. Thyroid normal.  Lungs:  Clear without wheezing, rales or rhonchi Cardiac: RR, without RMG Abdominal:  Soft, nontender, without masses, guarding, rebound, organomegaly or hernia Breasts:  Examined lying and sitting without masses, retractions, discharge or axillary adenopathy. Pelvic:  Ext, BUS, Vagina: With atrophic changes  Cervix: With atrophic changes  Uterus: Unable to palpate but no gross masses or tenderness   Adnexa: Without gross masses or tenderness    Anus and perineum: Normal   Rectovaginal: Normal sphincter tone without palpated masses or tenderness.    Assessment/Plan:  76 y.o. [id number] female for breast and pelvic exam.   1. Postmenopausal/atrophic genital changes. No significant hot flushes, night sweats, vaginal dryness or any vaginal bleeding. Continue to monitor report any issues. 2. History of CT scan done 2017 which showed a 2.2 cm left ovarian cyst. Ultrasound by Dr. Cherylann Banas 2012 showed 15 mm left ovarian avascular cyst. Ultrasound 2017 showed endometrial echo 5.8 mm, left ovarian avascular cyst 30 mm. Sonohysterogram attempted but unsuccessful due to cervical stenosis. Follow up ultrasound 01/2016 showed endometrial echo 3.7 mm with left cystic  area 32 x 25 x 23 mm and right ovary with echo-free cyst 23 x 9 mm. CA-125 was done which was 5. Patient has ultrasound scheduled next week in follow up 3. Osteopenia. DEXA 08/2015 T score -2.2 FRAX 5%/1.3%. Increase calcium vitamin D. Follow up DEXA next year. 4. Mammography 2016. I reminded patient she is overdue when she agrees to call and schedule now. SBE monthly reviewed. 5. Pap smear 2017. No Pap smear done today. No history of abnormal Pap smears previously. 6. Colonoscopy 2017. Repeat at their recommended interval. 7. Health maintenance. No routine lab work done as this is done elsewhere. Follow up for ultrasound as scheduled next week. Follow up in one year for annual exam.   Anastasio Auerbach MD, 3:42 PM 08/10/2016

## 2016-08-10 NOTE — Patient Instructions (Signed)
Follow up for ultrasound as scheduled next week.

## 2016-08-16 ENCOUNTER — Encounter: Payer: Self-pay | Admitting: Gynecology

## 2016-08-16 ENCOUNTER — Ambulatory Visit (INDEPENDENT_AMBULATORY_CARE_PROVIDER_SITE_OTHER): Payer: Medicare Other | Admitting: Gynecology

## 2016-08-16 ENCOUNTER — Ambulatory Visit (INDEPENDENT_AMBULATORY_CARE_PROVIDER_SITE_OTHER): Payer: Medicare Other

## 2016-08-16 VITALS — BP 130/74

## 2016-08-16 DIAGNOSIS — N83202 Unspecified ovarian cyst, left side: Secondary | ICD-10-CM

## 2016-08-16 DIAGNOSIS — N83201 Unspecified ovarian cyst, right side: Secondary | ICD-10-CM

## 2016-08-16 NOTE — Progress Notes (Signed)
    TAWNIE EHRESMAN 07-31-1940 [id number]        76 y.o.  [id number] presents for follow up ultrasound.  History of left ovarian avascular cyst and endometrial focus negative CA-125 being followed expectantly. Last ultrasound 2017 showed endometrial echo 3.7 mm. Questionable focus 12 x 8 mm. Right ovary echo-free cyst 23 x 9 mm. Left ovary 32 x 25 x 32 mm. Attempted sonohysterogram unsuccessful due to cervical stenosis.  Past medical history,surgical history, problem list, medications, allergies, family history and social history were all reviewed and documented in the EPIC chart.  Directed ROS with pertinent positives and negatives documented in the history of present illness/assessment and plan.  Exam: Vitals:   08/16/16 1550  BP: 130/74   General appearance:  Normal  Ultrasound transvaginal shows uterus normal size anteverted. Endometrial echo 3.2 mm. Endometrial focus 10 x 8 mm. Right ovary with 13 x 8 x 12 mm cyst. Left ovary with 43 x 29 x 26 mm echo-free cyst. Cul-de-sac negative.  Assessment/Plan:  76 y.o. [id number] with bilateral small ovarian cysts overall stable by ultrasound. Simple avascular with CA-125 negative before. Endometrial echo thin with small focus questionable polyp unchanged if not smaller than measured previously. Reviewed options with patient to include no further follow up has overall stable since last evaluation several months ago. Alternatives to include follow up ultrasound at some point up to including surgery. After weighing the risks versus benefits of choice the patient would prefer follow up ultrasound in 6 months just to relook at these areas. She'll schedule follow up for this. She'll follow up sooner she does any bleeding or develops any discomfort or pain.    Anastasio Auerbach MD, 4:25 PM 08/16/2016

## 2016-08-16 NOTE — Patient Instructions (Signed)
Follow-up in 6 months for repeat ultrasound 

## 2016-09-15 ENCOUNTER — Ambulatory Visit
Admission: RE | Admit: 2016-09-15 | Discharge: 2016-09-15 | Disposition: A | Discharge status: Home / Self Care | Payer: Medicare Other | Source: Ambulatory Visit | Attending: Gynecology | Admitting: Gynecology

## 2016-09-15 DIAGNOSIS — Z1231 Encounter for screening mammogram for malignant neoplasm of breast: Secondary | ICD-10-CM

## 2016-09-20 ENCOUNTER — Encounter: Payer: Self-pay | Admitting: Gynecology

## 2016-10-19 ENCOUNTER — Ambulatory Visit (INDEPENDENT_AMBULATORY_CARE_PROVIDER_SITE_OTHER): Payer: Medicare Other | Admitting: Allergy

## 2016-10-19 ENCOUNTER — Encounter: Payer: Self-pay | Admitting: Allergy

## 2016-10-19 VITALS — BP 132/80 | HR 84 | Temp 98.2°F | Resp 20 | Ht 62.0 in | Wt 260.6 lb

## 2016-10-19 DIAGNOSIS — J3089 Other allergic rhinitis: Secondary | ICD-10-CM | POA: Diagnosis not present

## 2016-10-19 DIAGNOSIS — T783XXD Angioneurotic edema, subsequent encounter: Secondary | ICD-10-CM

## 2016-10-19 DIAGNOSIS — L501 Idiopathic urticaria: Secondary | ICD-10-CM

## 2016-10-19 MED ORDER — RANITIDINE HCL 150 MG PO CAPS: 150.0000 mg | ORAL_CAPSULE | Freq: Two times a day (BID) | ORAL | 5 refills | Status: DC

## 2016-10-19 MED ORDER — MONTELUKAST SODIUM 10 MG PO TABS: 10.0000 mg | ORAL_TABLET | Freq: Every day | ORAL | 5 refills | Status: DC

## 2016-10-19 MED ORDER — FEXOFENADINE HCL 180 MG PO TABS: 180.0000 mg | ORAL_TABLET | Freq: Every day | ORAL | 5 refills | Status: AC

## 2016-10-19 NOTE — Patient Instructions (Signed)
Hives and swelling  - continue following medications:   Allegra 180mg  twice a day     Zantac 150mg  twice a day  Singulair 10mg  at bedtime  - may use benadryl 25mg  as needed for breakthrough symptoms  - have access to your Epipen  - Xolair monthly injections discussed today as you still have symptoms on high-dose antihistamine regimen.  Benefits and risks of Xolair discussed.  Will proceed with approval process.    Allergic rhinitis - Allegra as above  Follow-up 4-6 months

## 2016-10-19 NOTE — Progress Notes (Signed)
Follow-up Note  RE: Kimberly Crosby MRN: [id number] DOB: 20-Feb-1941 Date of Office Visit: 10/19/2016   History of present illness: Kimberly Crosby is a 76 y.o. female presenting today for follow-up of urticaria and angioedema. She was last seen in the office on 01/07/2016 by myself.  Last weekend she reports she had facial swelling that lasted all weekend.  She reports she went to bed and woke up with the swelling.  She took regular allegra and prn benadryl.   She states this is the worse swelling she has had since last visit.  She does report having lip swelling about monthly.   She still has hives weekly.  She takes allegra twice day, zantac a twice a day and singulair daily.  She ran out of singulair about a week ago.    Otherwise she reports she has not had any major changes in health, no surgeries or hospitalizations.      Review of systems: Review of Systems  Constitutional: Negative for chills, fever and malaise/fatigue.  HENT: Negative for congestion, ear discharge, ear pain, nosebleeds, sinus pain, sore throat and tinnitus.   Eyes: Negative for discharge and redness.  Respiratory: Negative for cough, shortness of breath and wheezing.   Cardiovascular: Negative for chest pain.  Gastrointestinal: Negative for abdominal pain, diarrhea, heartburn, nausea and vomiting.  Musculoskeletal: Negative for joint pain and myalgias.  Skin: Positive for itching and rash.  Neurological: Negative for headaches.    All other systems negative unless noted above in HPI  Past medical/social/surgical/family history have been reviewed and are unchanged unless specifically indicated below.  No changes  Medication List: Allergies as of 10/19/2016   No Known Allergies     Medication List       Accurate as of 10/19/16  1:47 PM. Always use your most recent med list.          fexofenadine 180 MG tablet Commonly known as:  ALLEGRA Take 180 mg by mouth daily.   latanoprost 0.005 %  ophthalmic solution Commonly known as:  XALATAN   LORazepam 1 MG tablet Commonly known as:  ATIVAN   montelukast 10 MG tablet Commonly known as:  SINGULAIR Take 1 tablet (10 mg total) by mouth at bedtime.   ranitidine 150 MG capsule Commonly known as:  ZANTAC Take 1 capsule (150 mg total) by mouth 2 (two) times daily.   telmisartan-hydrochlorothiazide 80-25 MG tablet Commonly known as:  MICARDIS HCT Take 1 tablet by mouth daily.       Known medication allergies: No Known Allergies   Physical examination: Blood pressure 132/80, pulse 84, temperature 98.2 F (36.8 C), temperature source Oral, resp. rate 20, height 5\' 2"  (1.575 m), weight 260 lb 9.6 oz (118.2 kg), SpO2 98 %.  General: Alert, interactive, in no acute distress. HEENT: PERRLA, TMs pearly gray, turbinates non-edematous without discharge, post-pharynx non erythematous. Neck: Supple without lymphadenopathy. Lungs: Clear to auscultation without wheezing, rhonchi or rales. {no increased work of breathing. CV: Normal S1, S2 without murmurs. Abdomen: Nondistended, nontender. Skin: Warm and dry, without lesions or rashes. Extremities:  No clubbing, cyanosis or edema. Neuro:   Grossly intact.  Diagnositics/Labs: None today  Assessment and plan: Chronic idiopathic urticaria and angioedema - not well controlled having weekly symptoms  - continue following medications:   Allegra 180mg  twice a day     Zantac 150mg  twice a day  Singulair 10mg  at bedtime  - may use benadryl 25mg  as needed for breakthrough symptoms  -  have access to your Epipen  - Xolair monthly injections discussed today as you still have symptoms on high-dose antihistamine regimen.  Benefits and risks of Xolair discussed.  Will proceed with approval process.    Allergic rhinitis - Allegra as above  Follow-up 4-6 months   I appreciate the opportunity to take part in [facility] care. Please do not hesitate to contact me with  questions.  Sincerely,   Prudy Feeler, MD Allergy/Immunology [facility] [facility] [place]

## 2016-10-26 ENCOUNTER — Ambulatory Visit: Payer: Federal, State, Local not specified - PPO

## 2016-10-26 DIAGNOSIS — I10 Essential (primary) hypertension: Secondary | ICD-10-CM | POA: Diagnosis not present

## 2016-10-26 DIAGNOSIS — F064 Anxiety disorder due to known physiological condition: Secondary | ICD-10-CM | POA: Diagnosis not present

## 2016-10-26 DIAGNOSIS — E6609 Other obesity due to excess calories: Secondary | ICD-10-CM | POA: Diagnosis not present

## 2016-10-26 DIAGNOSIS — N3281 Overactive bladder: Secondary | ICD-10-CM | POA: Diagnosis not present

## 2016-10-26 DIAGNOSIS — R7309 Other abnormal glucose: Secondary | ICD-10-CM | POA: Diagnosis not present

## 2016-10-26 DIAGNOSIS — T783XXS Angioneurotic edema, sequela: Secondary | ICD-10-CM | POA: Diagnosis not present

## 2016-11-01 DIAGNOSIS — L501 Idiopathic urticaria: Secondary | ICD-10-CM

## 2016-11-02 ENCOUNTER — Ambulatory Visit (INDEPENDENT_AMBULATORY_CARE_PROVIDER_SITE_OTHER): Payer: Medicare Other | Admitting: *Deleted

## 2016-11-02 DIAGNOSIS — L501 Idiopathic urticaria: Secondary | ICD-10-CM

## 2016-11-02 MED ORDER — OMALIZUMAB 150 MG ~~LOC~~ SOLR
300.0000 mg | SUBCUTANEOUS | Status: AC
  Administered 2016-11-02 – 2024-05-23 (×95): 300 mg via SUBCUTANEOUS

## 2016-11-02 NOTE — Progress Notes (Signed)
Immunotherapy   Patient Details  Name: Kimberly Crosby MRN: [id number] Date of Birth: [birth year]  11/02/2016  [name] started injections for  Xolair 300 mg every 4 weeks. Epi-Pen:Epi-Pen Available  Consent signed and patient instructions given. No problems after 60 minutes in the office.   Constance Holster 11/02/2016, 6:31 PM

## 2016-11-09 DIAGNOSIS — R351 Nocturia: Secondary | ICD-10-CM | POA: Diagnosis not present

## 2016-11-30 ENCOUNTER — Ambulatory Visit: Payer: Federal, State, Local not specified - PPO | Admitting: *Deleted

## 2016-12-27 DIAGNOSIS — L501 Idiopathic urticaria: Secondary | ICD-10-CM | POA: Diagnosis not present

## 2016-12-28 ENCOUNTER — Ambulatory Visit (INDEPENDENT_AMBULATORY_CARE_PROVIDER_SITE_OTHER): Payer: Medicare Other | Admitting: *Deleted

## 2016-12-28 DIAGNOSIS — L501 Idiopathic urticaria: Secondary | ICD-10-CM | POA: Diagnosis not present

## 2017-01-02 DIAGNOSIS — H401111 Primary open-angle glaucoma, right eye, mild stage: Secondary | ICD-10-CM | POA: Diagnosis not present

## 2017-01-02 DIAGNOSIS — H401122 Primary open-angle glaucoma, left eye, moderate stage: Secondary | ICD-10-CM | POA: Diagnosis not present

## 2017-01-24 DIAGNOSIS — L501 Idiopathic urticaria: Secondary | ICD-10-CM | POA: Diagnosis not present

## 2017-01-25 ENCOUNTER — Ambulatory Visit (INDEPENDENT_AMBULATORY_CARE_PROVIDER_SITE_OTHER): Payer: Medicare Other | Admitting: *Deleted

## 2017-01-25 DIAGNOSIS — L501 Idiopathic urticaria: Secondary | ICD-10-CM

## 2017-02-02 ENCOUNTER — Telehealth: Payer: Self-pay

## 2017-02-02 NOTE — Telephone Encounter (Signed)
Lm for pt to call us back about the montelukast recall. Pt will need to take old bottle to the nearest pharmacy and exchange it for a newer one.

## 2017-02-02 NOTE — Telephone Encounter (Signed)
Informed pt of recall on montelukast

## 2017-02-21 ENCOUNTER — Ambulatory Visit: Payer: Medicare Other | Admitting: Gynecology

## 2017-02-21 ENCOUNTER — Other Ambulatory Visit: Payer: Federal, State, Local not specified - PPO

## 2017-02-21 DIAGNOSIS — L501 Idiopathic urticaria: Secondary | ICD-10-CM

## 2017-02-22 ENCOUNTER — Ambulatory Visit (INDEPENDENT_AMBULATORY_CARE_PROVIDER_SITE_OTHER): Payer: Medicare Other | Admitting: *Deleted

## 2017-02-22 DIAGNOSIS — L501 Idiopathic urticaria: Secondary | ICD-10-CM | POA: Diagnosis not present

## 2017-02-28 ENCOUNTER — Other Ambulatory Visit: Payer: Federal, State, Local not specified - PPO

## 2017-02-28 ENCOUNTER — Ambulatory Visit: Payer: Medicare Other | Admitting: Gynecology

## 2017-03-09 DIAGNOSIS — M130 Polyarthritis, unspecified: Secondary | ICD-10-CM | POA: Diagnosis not present

## 2017-03-09 DIAGNOSIS — I10 Essential (primary) hypertension: Secondary | ICD-10-CM | POA: Diagnosis not present

## 2017-03-09 DIAGNOSIS — F064 Anxiety disorder due to known physiological condition: Secondary | ICD-10-CM | POA: Diagnosis not present

## 2017-03-09 DIAGNOSIS — E6609 Other obesity due to excess calories: Secondary | ICD-10-CM | POA: Diagnosis not present

## 2017-03-21 DIAGNOSIS — L501 Idiopathic urticaria: Secondary | ICD-10-CM

## 2017-03-22 ENCOUNTER — Ambulatory Visit (INDEPENDENT_AMBULATORY_CARE_PROVIDER_SITE_OTHER): Payer: Medicare Other | Admitting: *Deleted

## 2017-03-22 DIAGNOSIS — L501 Idiopathic urticaria: Secondary | ICD-10-CM

## 2017-03-26 ENCOUNTER — Ambulatory Visit (INDEPENDENT_AMBULATORY_CARE_PROVIDER_SITE_OTHER): Payer: Medicare Other | Admitting: Gynecology

## 2017-03-26 ENCOUNTER — Ambulatory Visit (INDEPENDENT_AMBULATORY_CARE_PROVIDER_SITE_OTHER): Payer: Medicare Other

## 2017-03-26 ENCOUNTER — Encounter: Payer: Self-pay | Admitting: Gynecology

## 2017-03-26 VITALS — BP 124/84

## 2017-03-26 DIAGNOSIS — N83201 Unspecified ovarian cyst, right side: Secondary | ICD-10-CM

## 2017-03-26 DIAGNOSIS — N9489 Other specified conditions associated with female genital organs and menstrual cycle: Secondary | ICD-10-CM

## 2017-03-26 DIAGNOSIS — N83202 Unspecified ovarian cyst, left side: Secondary | ICD-10-CM | POA: Diagnosis not present

## 2017-03-26 NOTE — Patient Instructions (Signed)
Follow-up in April when due for annual exam. 

## 2017-03-26 NOTE — Progress Notes (Signed)
    Kimberly Crosby 1940/08/17 [id number]        76 y.o.  [id number] resents for follow-up ultrasound.  History of persistent small echogenic focus in the endometrium unchanged over serial ultrasounds.  Also with bilateral ovarian cystic changes unchanged over serial ultrasounds.  CA 125 negative, avascular in nature.  Echogenic focus previously measured 10 x 8 mm and cystic changes measured 13 x 8 x 12 mm on the right and 43 x 29 x 26 mm on the left  Past medical history,surgical history, problem list, medications, allergies, family history and social history were all reviewed and documented in the EPIC chart.  Directed ROS with pertinent positives and negatives documented in the history of present illness/assessment and plan.  Exam: Vitals:   03/26/17 1118  BP: 124/84   General appearance:  Normal  Ultrasound transvaginal and transabdominal normal length and echotexture.  Fluid-filled endometrium noted at 20 x 2.9 mm.  Echogenic focus measuring 10 x 7 mm noted.  Small intramural myoma 9 x 9 mm.  Right ovary is normal with small cyst 9 mm.  Left ovary normal with adjacent thin-walled echo-free avascular cyst measuring 37 x 29 x 31 mm.  Cul-de-sac negative.  Assessment/Plan:  76 y.o. [id number] with persistent small echogenic focus of the endometrium unchanged.  Right and left ovaries with small cystic changes unchanged if not smaller from prior ultrasound.  Patient asymptomatic without discomfort.  All points towards a benign etiology for both the endometrial focus and the ovarian cysts.  We again discussed options to include surgical versus observational.  Patient is comfortable with following expectantly.  Discussed whether to continue with ultrasound surveillance.  Will readdress when I see her in April for her annual exam this coming year.  Greater than 50% of my time was spent in direct face to face counseling and coordination of care with the patient.     Anastasio Auerbach MD, 11:45 AM  03/26/2017     \

## 2017-04-10 DIAGNOSIS — I110 Hypertensive heart disease with heart failure: Secondary | ICD-10-CM | POA: Diagnosis not present

## 2017-04-10 DIAGNOSIS — I10 Essential (primary) hypertension: Secondary | ICD-10-CM | POA: Diagnosis not present

## 2017-04-10 DIAGNOSIS — F064 Anxiety disorder due to known physiological condition: Secondary | ICD-10-CM | POA: Diagnosis not present

## 2017-04-18 DIAGNOSIS — L501 Idiopathic urticaria: Secondary | ICD-10-CM | POA: Diagnosis not present

## 2017-04-19 ENCOUNTER — Ambulatory Visit (INDEPENDENT_AMBULATORY_CARE_PROVIDER_SITE_OTHER): Payer: Medicare Other | Admitting: *Deleted

## 2017-04-19 DIAGNOSIS — L501 Idiopathic urticaria: Secondary | ICD-10-CM | POA: Diagnosis not present

## 2017-05-07 DIAGNOSIS — Z961 Presence of intraocular lens: Secondary | ICD-10-CM | POA: Diagnosis not present

## 2017-05-07 DIAGNOSIS — H401122 Primary open-angle glaucoma, left eye, moderate stage: Secondary | ICD-10-CM | POA: Diagnosis not present

## 2017-05-07 DIAGNOSIS — H43813 Vitreous degeneration, bilateral: Secondary | ICD-10-CM | POA: Diagnosis not present

## 2017-05-07 DIAGNOSIS — H401111 Primary open-angle glaucoma, right eye, mild stage: Secondary | ICD-10-CM | POA: Diagnosis not present

## 2017-05-16 DIAGNOSIS — L501 Idiopathic urticaria: Secondary | ICD-10-CM

## 2017-05-17 ENCOUNTER — Ambulatory Visit (INDEPENDENT_AMBULATORY_CARE_PROVIDER_SITE_OTHER): Payer: Medicare Other | Admitting: *Deleted

## 2017-05-17 DIAGNOSIS — L501 Idiopathic urticaria: Secondary | ICD-10-CM

## 2017-05-23 DIAGNOSIS — I509 Heart failure, unspecified: Secondary | ICD-10-CM | POA: Diagnosis not present

## 2017-07-02 DIAGNOSIS — L501 Idiopathic urticaria: Secondary | ICD-10-CM

## 2017-07-03 ENCOUNTER — Ambulatory Visit (INDEPENDENT_AMBULATORY_CARE_PROVIDER_SITE_OTHER): Payer: Medicare Other | Admitting: *Deleted

## 2017-07-03 DIAGNOSIS — L501 Idiopathic urticaria: Secondary | ICD-10-CM | POA: Diagnosis not present

## 2017-07-04 DIAGNOSIS — M130 Polyarthritis, unspecified: Secondary | ICD-10-CM | POA: Diagnosis not present

## 2017-07-04 DIAGNOSIS — E6609 Other obesity due to excess calories: Secondary | ICD-10-CM | POA: Diagnosis not present

## 2017-07-04 DIAGNOSIS — R351 Nocturia: Secondary | ICD-10-CM | POA: Diagnosis not present

## 2017-07-04 DIAGNOSIS — I10 Essential (primary) hypertension: Secondary | ICD-10-CM | POA: Diagnosis not present

## 2017-07-05 ENCOUNTER — Other Ambulatory Visit: Payer: Self-pay

## 2017-07-05 DIAGNOSIS — L501 Idiopathic urticaria: Secondary | ICD-10-CM

## 2017-07-05 DIAGNOSIS — T783XXD Angioneurotic edema, subsequent encounter: Secondary | ICD-10-CM

## 2017-07-09 ENCOUNTER — Other Ambulatory Visit: Payer: Self-pay

## 2017-07-09 DIAGNOSIS — L501 Idiopathic urticaria: Secondary | ICD-10-CM

## 2017-07-09 DIAGNOSIS — T783XXD Angioneurotic edema, subsequent encounter: Secondary | ICD-10-CM

## 2017-07-11 ENCOUNTER — Other Ambulatory Visit: Payer: Self-pay

## 2017-07-11 DIAGNOSIS — L501 Idiopathic urticaria: Secondary | ICD-10-CM

## 2017-07-11 DIAGNOSIS — T783XXD Angioneurotic edema, subsequent encounter: Secondary | ICD-10-CM

## 2017-07-11 NOTE — Telephone Encounter (Signed)
Received fax for refill for Ranitidine. Patient was last seen 10/19/2016 and was to return in 4-6 months. Refill denied patient needs office visit.

## 2017-07-12 ENCOUNTER — Other Ambulatory Visit: Payer: Self-pay

## 2017-07-12 DIAGNOSIS — T783XXD Angioneurotic edema, subsequent encounter: Secondary | ICD-10-CM

## 2017-07-12 DIAGNOSIS — L501 Idiopathic urticaria: Secondary | ICD-10-CM

## 2017-07-12 NOTE — Telephone Encounter (Signed)
RF on ranitidine denied, pt needs an OV

## 2017-07-18 DIAGNOSIS — Z0189 Encounter for other specified special examinations: Secondary | ICD-10-CM | POA: Diagnosis not present

## 2017-07-18 DIAGNOSIS — I5033 Acute on chronic diastolic (congestive) heart failure: Secondary | ICD-10-CM | POA: Diagnosis not present

## 2017-07-18 DIAGNOSIS — Z6841 Body Mass Index (BMI) 40.0 and over, adult: Secondary | ICD-10-CM | POA: Diagnosis not present

## 2017-07-18 DIAGNOSIS — R0609 Other forms of dyspnea: Secondary | ICD-10-CM | POA: Diagnosis not present

## 2017-07-18 DIAGNOSIS — I10 Essential (primary) hypertension: Secondary | ICD-10-CM | POA: Diagnosis not present

## 2017-07-26 ENCOUNTER — Other Ambulatory Visit: Payer: Self-pay

## 2017-07-26 DIAGNOSIS — L501 Idiopathic urticaria: Secondary | ICD-10-CM

## 2017-07-26 DIAGNOSIS — T783XXD Angioneurotic edema, subsequent encounter: Secondary | ICD-10-CM

## 2017-07-26 MED ORDER — RANITIDINE HCL 150 MG PO CAPS: 150.0000 mg | ORAL_CAPSULE | Freq: Two times a day (BID) | ORAL | 0 refills | Status: DC

## 2017-07-27 DIAGNOSIS — I5033 Acute on chronic diastolic (congestive) heart failure: Secondary | ICD-10-CM | POA: Diagnosis not present

## 2017-07-27 DIAGNOSIS — I10 Essential (primary) hypertension: Secondary | ICD-10-CM | POA: Diagnosis not present

## 2017-07-27 DIAGNOSIS — R0602 Shortness of breath: Secondary | ICD-10-CM | POA: Diagnosis not present

## 2017-07-31 ENCOUNTER — Ambulatory Visit (INDEPENDENT_AMBULATORY_CARE_PROVIDER_SITE_OTHER): Payer: Medicare Other | Admitting: *Deleted

## 2017-07-31 DIAGNOSIS — L501 Idiopathic urticaria: Secondary | ICD-10-CM

## 2017-08-01 DIAGNOSIS — L501 Idiopathic urticaria: Secondary | ICD-10-CM | POA: Diagnosis not present

## 2017-08-20 DIAGNOSIS — I10 Essential (primary) hypertension: Secondary | ICD-10-CM | POA: Diagnosis not present

## 2017-08-20 DIAGNOSIS — I509 Heart failure, unspecified: Secondary | ICD-10-CM | POA: Diagnosis not present

## 2017-08-27 DIAGNOSIS — L501 Idiopathic urticaria: Secondary | ICD-10-CM | POA: Diagnosis not present

## 2017-08-28 ENCOUNTER — Ambulatory Visit (INDEPENDENT_AMBULATORY_CARE_PROVIDER_SITE_OTHER): Payer: Medicare Other | Admitting: *Deleted

## 2017-08-28 DIAGNOSIS — L501 Idiopathic urticaria: Secondary | ICD-10-CM | POA: Diagnosis not present

## 2017-09-24 DIAGNOSIS — L501 Idiopathic urticaria: Secondary | ICD-10-CM | POA: Diagnosis not present

## 2017-09-25 ENCOUNTER — Ambulatory Visit (INDEPENDENT_AMBULATORY_CARE_PROVIDER_SITE_OTHER): Payer: Medicare Other | Admitting: *Deleted

## 2017-09-25 DIAGNOSIS — L501 Idiopathic urticaria: Secondary | ICD-10-CM | POA: Diagnosis not present

## 2017-09-27 DIAGNOSIS — I10 Essential (primary) hypertension: Secondary | ICD-10-CM | POA: Diagnosis not present

## 2017-09-27 DIAGNOSIS — I5033 Acute on chronic diastolic (congestive) heart failure: Secondary | ICD-10-CM | POA: Diagnosis not present

## 2017-09-27 DIAGNOSIS — R0609 Other forms of dyspnea: Secondary | ICD-10-CM | POA: Diagnosis not present

## 2017-10-02 DIAGNOSIS — H401111 Primary open-angle glaucoma, right eye, mild stage: Secondary | ICD-10-CM | POA: Diagnosis not present

## 2017-10-02 DIAGNOSIS — H401122 Primary open-angle glaucoma, left eye, moderate stage: Secondary | ICD-10-CM | POA: Diagnosis not present

## 2017-10-03 ENCOUNTER — Other Ambulatory Visit: Payer: Self-pay | Admitting: Gynecology

## 2017-10-03 DIAGNOSIS — I10 Essential (primary) hypertension: Secondary | ICD-10-CM | POA: Diagnosis not present

## 2017-10-03 DIAGNOSIS — F064 Anxiety disorder due to known physiological condition: Secondary | ICD-10-CM | POA: Diagnosis not present

## 2017-10-03 DIAGNOSIS — E6609 Other obesity due to excess calories: Secondary | ICD-10-CM | POA: Diagnosis not present

## 2017-10-03 DIAGNOSIS — I110 Hypertensive heart disease with heart failure: Secondary | ICD-10-CM | POA: Diagnosis not present

## 2017-10-03 DIAGNOSIS — Z1231 Encounter for screening mammogram for malignant neoplasm of breast: Secondary | ICD-10-CM

## 2017-10-05 DIAGNOSIS — I5032 Chronic diastolic (congestive) heart failure: Secondary | ICD-10-CM | POA: Diagnosis not present

## 2017-10-05 DIAGNOSIS — R0609 Other forms of dyspnea: Secondary | ICD-10-CM | POA: Diagnosis not present

## 2017-10-05 DIAGNOSIS — Z6841 Body Mass Index (BMI) 40.0 and over, adult: Secondary | ICD-10-CM | POA: Diagnosis not present

## 2017-10-15 DIAGNOSIS — I5032 Chronic diastolic (congestive) heart failure: Secondary | ICD-10-CM | POA: Diagnosis not present

## 2017-10-22 DIAGNOSIS — L501 Idiopathic urticaria: Secondary | ICD-10-CM | POA: Diagnosis not present

## 2017-10-23 ENCOUNTER — Ambulatory Visit (INDEPENDENT_AMBULATORY_CARE_PROVIDER_SITE_OTHER): Payer: Medicare Other | Admitting: *Deleted

## 2017-10-23 DIAGNOSIS — L501 Idiopathic urticaria: Secondary | ICD-10-CM

## 2017-10-26 ENCOUNTER — Ambulatory Visit
Admission: RE | Admit: 2017-10-26 | Discharge: 2017-10-26 | Disposition: A | Discharge status: Home / Self Care | Payer: Medicare Other | Source: Ambulatory Visit | Attending: Gynecology | Admitting: Gynecology

## 2017-10-26 DIAGNOSIS — Z1231 Encounter for screening mammogram for malignant neoplasm of breast: Secondary | ICD-10-CM

## 2017-11-05 DIAGNOSIS — M810 Age-related osteoporosis without current pathological fracture: Secondary | ICD-10-CM

## 2017-11-05 HISTORY — DX: Age-related osteoporosis without current pathological fracture: M81.0

## 2017-11-14 DIAGNOSIS — I10 Essential (primary) hypertension: Secondary | ICD-10-CM | POA: Diagnosis not present

## 2017-11-14 DIAGNOSIS — Z6841 Body Mass Index (BMI) 40.0 and over, adult: Secondary | ICD-10-CM | POA: Diagnosis not present

## 2017-11-14 DIAGNOSIS — R0609 Other forms of dyspnea: Secondary | ICD-10-CM | POA: Diagnosis not present

## 2017-11-14 DIAGNOSIS — E6609 Other obesity due to excess calories: Secondary | ICD-10-CM | POA: Diagnosis not present

## 2017-11-14 DIAGNOSIS — I5030 Unspecified diastolic (congestive) heart failure: Secondary | ICD-10-CM | POA: Diagnosis not present

## 2017-11-19 DIAGNOSIS — L501 Idiopathic urticaria: Secondary | ICD-10-CM

## 2017-11-20 ENCOUNTER — Ambulatory Visit (INDEPENDENT_AMBULATORY_CARE_PROVIDER_SITE_OTHER): Payer: Medicare Other | Admitting: *Deleted

## 2017-11-20 DIAGNOSIS — L501 Idiopathic urticaria: Secondary | ICD-10-CM | POA: Diagnosis not present

## 2017-11-22 ENCOUNTER — Ambulatory Visit (INDEPENDENT_AMBULATORY_CARE_PROVIDER_SITE_OTHER): Payer: Medicare Other | Admitting: Gynecology

## 2017-11-22 ENCOUNTER — Encounter: Payer: Self-pay | Admitting: Gynecology

## 2017-11-22 VITALS — BP 130/80 | Ht 63.0 in | Wt 253.0 lb

## 2017-11-22 DIAGNOSIS — M8580 Other specified disorders of bone density and structure, unspecified site: Secondary | ICD-10-CM

## 2017-11-22 DIAGNOSIS — N952 Postmenopausal atrophic vaginitis: Secondary | ICD-10-CM

## 2017-11-22 DIAGNOSIS — Z01419 Encounter for gynecological examination (general) (routine) without abnormal findings: Secondary | ICD-10-CM

## 2017-11-22 DIAGNOSIS — N83209 Unspecified ovarian cyst, unspecified side: Secondary | ICD-10-CM

## 2017-11-22 NOTE — Patient Instructions (Signed)
Followup for bone density as scheduled. 

## 2017-11-22 NOTE — Progress Notes (Signed)
    PUALANI BORAH Apr 28, 1941 [id number]        77 y.o.  [id number] for breast and pelvic exam.  Without gynecologic complaints  Past medical history,surgical history, problem list, medications, allergies, family history and social history were all reviewed and documented as reviewed in the EPIC chart.  ROS:  Performed with pertinent positives and negatives included in the history, assessment and plan.   Additional significant findings : None   Exam: Caryn Bee assistant Vitals:   11/22/17 1208  BP: 130/80  Weight: 253 lb (114.8 kg)  Height: 5\' 3"  (1.6 m)   Body mass index is 44.82 kg/m.  General appearance:  Normal affect, orientation and appearance. Skin: Grossly normal HEENT: Without gross lesions.  No cervical or supraclavicular adenopathy. Thyroid normal.  Lungs:  Clear without wheezing, rales or rhonchi Cardiac: RR, without RMG Abdominal:  Soft, nontender, without masses, guarding, rebound, organomegaly or hernia Breasts:  Examined lying and sitting without masses, retractions, discharge or axillary adenopathy. Pelvic:  Ext, BUS, Vagina: With atrophic changes  Cervix: With atrophic changes  Uterus: Difficult to palpate but no gross masses or tenderness., normal size, shape and contour, midline and mobile nontender   Adnexa: Without gross masses or tenderness    Anus and perineum: Normal   Rectovaginal: Normal sphincter tone without palpated masses or tenderness.    Assessment/Plan:  77 y.o. [id number] female for breast and pelvic exam  1. Postmenopausal/atrophic genital changes.  No significant menopausal symptoms or any bleeding. 2. History of left ovarian cyst and echogenic focus within the endometrium initially found 2017.  Serial follow-up ultrasounds have shown no change in the 2 areas.  CA 125 have been negative.  Most recent scan 03/2017 showed 37 x 29 x 31 mm echo-free avascular cyst on the left ovary and an echogenic focus measuring 10 x 7 mm within the endometrium.   I reviewed with the patient the options to include repeat ultrasound now versus expectant management with no intervention at this time.  Disclaimer that cannot guarantee not cancer discussed although these 2 areas have remained unchanged over the past 2 years observation.  At this point the patient is comfortable with no further studies but will call if she changes her mind. 3. Mammography 10/2017.  Continue with annual mammography when due. 4. DEXA 2017 T score -2.2 FRAX 5% / 1.3%.  Repeat DEXA now at 2-year interval and patient will schedule in follow-up for this. 5. Colonoscopy 2017.  Repeat at their recommended interval. 6. Pap smear 2017.  No Pap smear done today.  No history of abnormal Pap smears previously.  Reviewed current screening guidelines and we both agree to stop screening based on age. 7. Health maintenance.  No routine lab work done as patient does this elsewhere.  Follow-up 1 year, sooner as needed.   Anastasio Auerbach MD, 12:32 PM 11/22/2017

## 2017-12-04 ENCOUNTER — Ambulatory Visit: Payer: Federal, State, Local not specified - PPO

## 2017-12-04 ENCOUNTER — Ambulatory Visit (INDEPENDENT_AMBULATORY_CARE_PROVIDER_SITE_OTHER): Payer: Medicare Other

## 2017-12-04 ENCOUNTER — Other Ambulatory Visit: Payer: Self-pay | Admitting: Gynecology

## 2017-12-04 DIAGNOSIS — M810 Age-related osteoporosis without current pathological fracture: Secondary | ICD-10-CM

## 2017-12-04 DIAGNOSIS — M8580 Other specified disorders of bone density and structure, unspecified site: Secondary | ICD-10-CM

## 2017-12-05 ENCOUNTER — Encounter: Payer: Self-pay | Admitting: Gynecology

## 2017-12-05 ENCOUNTER — Telehealth: Payer: Self-pay | Admitting: Gynecology

## 2017-12-05 NOTE — Telephone Encounter (Signed)
Tell patient that her most recent bone density shows osteoporosis in her distal forearm measurement.  Recommend office visit to discuss treatment options.

## 2017-12-06 ENCOUNTER — Encounter: Payer: Self-pay | Admitting: Anesthesiology

## 2017-12-06 NOTE — Telephone Encounter (Signed)
Left message to call me.

## 2017-12-06 NOTE — Telephone Encounter (Signed)
Spoke with patient and informed her. Appt scheduled. 

## 2017-12-11 ENCOUNTER — Ambulatory Visit (INDEPENDENT_AMBULATORY_CARE_PROVIDER_SITE_OTHER): Payer: Medicare Other | Admitting: Gynecology

## 2017-12-11 ENCOUNTER — Encounter: Payer: Self-pay | Admitting: Gynecology

## 2017-12-11 VITALS — BP 124/80

## 2017-12-11 DIAGNOSIS — M810 Age-related osteoporosis without current pathological fracture: Secondary | ICD-10-CM | POA: Diagnosis not present

## 2017-12-11 NOTE — Patient Instructions (Signed)
Osteoporosis Osteoporosis is the thinning and loss of density in the bones. Osteoporosis makes the bones more brittle, fragile, and likely to break (fracture). Over time, osteoporosis can cause the bones to become so weak that they fracture after a simple fall. The bones most likely to fracture are the bones in the hip, wrist, and spine. What are the causes? The exact cause is not known. What increases the risk? Anyone can develop osteoporosis. You may be at greater risk if you have a family history of the condition or have poor nutrition. You may also have a higher risk if you are:  Female.  50 years old or older.  A smoker.  Not physically active.  White or Asian.  Slender.  What are the signs or symptoms? A fracture might be the first sign of the disease, especially if it results from a fall or injury that would not usually cause a bone to break. Other signs and symptoms include:  Low back and neck pain.  Stooped posture.  Height loss.  How is this diagnosed? To make a diagnosis, your health care provider may:  Take a medical history.  Perform a physical exam.  Order tests, such as: ? A bone mineral density test. ? A dual-energy X-ray absorptiometry test.  How is this treated? The goal of osteoporosis treatment is to strengthen your bones to reduce your risk of a fracture. Treatment may involve:  Making lifestyle changes, such as: ? Eating a diet rich in calcium. ? Doing weight-bearing and muscle-strengthening exercises. ? Stopping tobacco use. ? Limiting alcohol intake.  Taking medicine to slow the process of bone loss or to increase bone density.  Monitoring your levels of calcium and vitamin D.  Follow these instructions at home:  Include calcium and vitamin D in your diet. Calcium is important for bone health, and vitamin D helps the body absorb calcium.  Perform weight-bearing and muscle-strengthening exercises as directed by your health care  provider.  Do not use any tobacco products, including cigarettes, chewing tobacco, and electronic cigarettes. If you need help quitting, ask your health care provider.  Limit your alcohol intake.  Take medicines only as directed by your health care provider.  Keep all follow-up visits as directed by your health care provider. This is important.  Take precautions at home to lower your risk of falling, such as: ? Keeping rooms well lit and clutter free. ? Installing safety rails on stairs. ? Using rubber mats in the bathroom and other areas that are often wet or slippery. Get help right away if: You fall or injure yourself. This information is not intended to replace advice given to you by your health care provider. Make sure you discuss any questions you have with your health care provider. Document Released: 02/01/2005 Document Revised: 09/27/2015 Document Reviewed: 10/02/2013 Elsevier Interactive Patient Education  2018 [employer]  

## 2017-12-11 NOTE — Progress Notes (Signed)
    Kimberly Crosby [birth year] [id number]        77 y.o.  [id number] presents to discuss her most recent bone density which shows a T score -3.4 at the distal third of the radius.  Other worst measurement is -2.1 at the spine.  When compared to her prior study 2017 she is stable at all sites.  FRAX calculation using her worst femoral neck -2.1 showed 4% / 0.9%  Past medical history,surgical history, problem list, medications, allergies, family history and social history were all reviewed and documented in the EPIC chart.  Directed ROS with pertinent positives and negatives documented in the history of present illness/assessment and plan.  Exam: Vitals:   12/11/17 0927  BP: 124/80   General appearance:  Normal   Assessment/Plan:  77 y.o. [id number] with osteoporosis distal third of radius.  Other measurements show osteopenia stable from prior studies with low calculated FRAX.  We discussed officially she has osteoporosis and that whether we should consider starting medication.  Osteoporosis in the wrist is predictive for fractures elsewhere reviewed.  She does walk on a regular basis.  We discussed adequate calcium and vitamin D intake.  Recommended checking a vitamin D level today.  Medication options were discussed.  At this point the patient and I both agree not to start medication based on the wrist measurement but optimize her weightbearing exercise calcium and vitamin D.  Repeat her bone density in 2 years.  She does clearly understand though this does not guarantee she will not have a fracture in the interim she is comfortable with this approach.  I spent a total of 15 minutes face-to-face minutes with the patient, over 50% was spent counseling and coordination of care.     Anastasio Auerbach MD, 9:56 AM 12/11/2017

## 2017-12-12 ENCOUNTER — Other Ambulatory Visit: Payer: Self-pay | Admitting: Gynecology

## 2017-12-12 DIAGNOSIS — E559 Vitamin D deficiency, unspecified: Secondary | ICD-10-CM

## 2017-12-12 LAB — VITAMIN D 25 HYDROXY (VIT D DEFICIENCY, FRACTURES): Vit D, 25-Hydroxy: 15 ng/mL — ABNORMAL LOW (ref 30–100)

## 2017-12-12 MED ORDER — VITAMIN D (ERGOCALCIFEROL) 1.25 MG (50000 UNIT) PO CAPS: 50000.0000 [IU] | ORAL_CAPSULE | ORAL | 0 refills | Status: DC

## 2017-12-17 DIAGNOSIS — L501 Idiopathic urticaria: Secondary | ICD-10-CM | POA: Diagnosis not present

## 2017-12-18 ENCOUNTER — Other Ambulatory Visit: Payer: Self-pay | Admitting: Allergy

## 2017-12-18 ENCOUNTER — Ambulatory Visit (INDEPENDENT_AMBULATORY_CARE_PROVIDER_SITE_OTHER): Payer: Medicare Other

## 2017-12-18 DIAGNOSIS — L501 Idiopathic urticaria: Secondary | ICD-10-CM | POA: Diagnosis not present

## 2017-12-18 DIAGNOSIS — T783XXD Angioneurotic edema, subsequent encounter: Secondary | ICD-10-CM

## 2018-01-15 ENCOUNTER — Ambulatory Visit: Payer: Self-pay

## 2018-01-21 DIAGNOSIS — L501 Idiopathic urticaria: Secondary | ICD-10-CM | POA: Diagnosis not present

## 2018-01-22 ENCOUNTER — Ambulatory Visit (INDEPENDENT_AMBULATORY_CARE_PROVIDER_SITE_OTHER): Payer: Medicare Other | Admitting: *Deleted

## 2018-01-22 DIAGNOSIS — L501 Idiopathic urticaria: Secondary | ICD-10-CM | POA: Diagnosis not present

## 2018-02-18 DIAGNOSIS — L501 Idiopathic urticaria: Secondary | ICD-10-CM

## 2018-02-19 ENCOUNTER — Ambulatory Visit (INDEPENDENT_AMBULATORY_CARE_PROVIDER_SITE_OTHER): Payer: Medicare Other | Admitting: *Deleted

## 2018-02-19 DIAGNOSIS — L501 Idiopathic urticaria: Secondary | ICD-10-CM | POA: Diagnosis not present

## 2018-03-12 ENCOUNTER — Ambulatory Visit: Payer: Federal, State, Local not specified - PPO

## 2018-03-18 DIAGNOSIS — L501 Idiopathic urticaria: Secondary | ICD-10-CM | POA: Diagnosis not present

## 2018-03-19 ENCOUNTER — Ambulatory Visit (INDEPENDENT_AMBULATORY_CARE_PROVIDER_SITE_OTHER): Payer: Medicare Other | Admitting: *Deleted

## 2018-03-19 DIAGNOSIS — L501 Idiopathic urticaria: Secondary | ICD-10-CM | POA: Diagnosis not present

## 2018-04-15 DIAGNOSIS — L501 Idiopathic urticaria: Secondary | ICD-10-CM | POA: Diagnosis not present

## 2018-04-16 ENCOUNTER — Ambulatory Visit (INDEPENDENT_AMBULATORY_CARE_PROVIDER_SITE_OTHER): Payer: Medicare Other | Admitting: *Deleted

## 2018-04-16 DIAGNOSIS — L501 Idiopathic urticaria: Secondary | ICD-10-CM | POA: Diagnosis not present

## 2018-05-13 ENCOUNTER — Other Ambulatory Visit: Payer: Federal, State, Local not specified - PPO

## 2018-05-13 DIAGNOSIS — L501 Idiopathic urticaria: Secondary | ICD-10-CM

## 2018-05-13 DIAGNOSIS — E559 Vitamin D deficiency, unspecified: Secondary | ICD-10-CM | POA: Diagnosis not present

## 2018-05-14 ENCOUNTER — Ambulatory Visit (INDEPENDENT_AMBULATORY_CARE_PROVIDER_SITE_OTHER): Payer: Medicare Other | Admitting: *Deleted

## 2018-05-14 DIAGNOSIS — L501 Idiopathic urticaria: Secondary | ICD-10-CM | POA: Diagnosis not present

## 2018-05-14 LAB — VITAMIN D 25 HYDROXY (VIT D DEFICIENCY, FRACTURES): Vit D, 25-Hydroxy: 26 ng/mL — ABNORMAL LOW (ref 30–100)

## 2018-06-10 DIAGNOSIS — L501 Idiopathic urticaria: Secondary | ICD-10-CM

## 2018-06-11 ENCOUNTER — Ambulatory Visit (INDEPENDENT_AMBULATORY_CARE_PROVIDER_SITE_OTHER): Payer: Medicare Other | Admitting: *Deleted

## 2018-06-11 ENCOUNTER — Other Ambulatory Visit: Payer: Self-pay | Admitting: Gynecology

## 2018-06-11 ENCOUNTER — Other Ambulatory Visit: Payer: Self-pay | Admitting: Cardiology

## 2018-06-11 DIAGNOSIS — L501 Idiopathic urticaria: Secondary | ICD-10-CM | POA: Diagnosis not present

## 2018-06-18 ENCOUNTER — Other Ambulatory Visit: Payer: Self-pay

## 2018-06-18 MED ORDER — SPIRONOLACTONE-HCTZ 25-25 MG PO TABS: 1.0000 | ORAL_TABLET | Freq: Every day | ORAL | 0 refills | Status: DC

## 2018-07-08 ENCOUNTER — Other Ambulatory Visit: Payer: Self-pay | Admitting: *Deleted

## 2018-07-08 DIAGNOSIS — E6609 Other obesity due to excess calories: Secondary | ICD-10-CM | POA: Diagnosis not present

## 2018-07-08 DIAGNOSIS — I10 Essential (primary) hypertension: Secondary | ICD-10-CM | POA: Diagnosis not present

## 2018-07-08 DIAGNOSIS — Z6841 Body Mass Index (BMI) 40.0 and over, adult: Secondary | ICD-10-CM | POA: Diagnosis not present

## 2018-07-08 DIAGNOSIS — I5030 Unspecified diastolic (congestive) heart failure: Secondary | ICD-10-CM | POA: Diagnosis not present

## 2018-07-08 DIAGNOSIS — E559 Vitamin D deficiency, unspecified: Secondary | ICD-10-CM

## 2018-07-08 MED ORDER — VITAMIN D (ERGOCALCIFEROL) 1.25 MG (50000 UNIT) PO CAPS: 50000.0000 [IU] | ORAL_CAPSULE | ORAL | 0 refills | Status: DC

## 2018-07-09 ENCOUNTER — Ambulatory Visit: Payer: Federal, State, Local not specified - PPO

## 2018-07-16 DIAGNOSIS — H52203 Unspecified astigmatism, bilateral: Secondary | ICD-10-CM | POA: Diagnosis not present

## 2018-07-16 DIAGNOSIS — H401122 Primary open-angle glaucoma, left eye, moderate stage: Secondary | ICD-10-CM | POA: Diagnosis not present

## 2018-07-16 DIAGNOSIS — H43813 Vitreous degeneration, bilateral: Secondary | ICD-10-CM | POA: Diagnosis not present

## 2018-07-16 DIAGNOSIS — H401111 Primary open-angle glaucoma, right eye, mild stage: Secondary | ICD-10-CM | POA: Diagnosis not present

## 2018-07-24 DIAGNOSIS — L501 Idiopathic urticaria: Secondary | ICD-10-CM | POA: Diagnosis not present

## 2018-07-25 ENCOUNTER — Ambulatory Visit (INDEPENDENT_AMBULATORY_CARE_PROVIDER_SITE_OTHER): Payer: Medicare Other | Admitting: *Deleted

## 2018-07-25 DIAGNOSIS — L501 Idiopathic urticaria: Secondary | ICD-10-CM

## 2018-08-21 DIAGNOSIS — L501 Idiopathic urticaria: Secondary | ICD-10-CM | POA: Diagnosis not present

## 2018-08-22 ENCOUNTER — Other Ambulatory Visit: Payer: Self-pay

## 2018-08-22 ENCOUNTER — Ambulatory Visit (INDEPENDENT_AMBULATORY_CARE_PROVIDER_SITE_OTHER): Payer: Medicare Other | Admitting: *Deleted

## 2018-08-22 DIAGNOSIS — L501 Idiopathic urticaria: Secondary | ICD-10-CM

## 2018-09-18 DIAGNOSIS — L501 Idiopathic urticaria: Secondary | ICD-10-CM | POA: Diagnosis not present

## 2018-09-19 ENCOUNTER — Ambulatory Visit (INDEPENDENT_AMBULATORY_CARE_PROVIDER_SITE_OTHER): Payer: Medicare Other

## 2018-09-19 ENCOUNTER — Other Ambulatory Visit: Payer: Self-pay

## 2018-09-19 DIAGNOSIS — L501 Idiopathic urticaria: Secondary | ICD-10-CM

## 2018-10-02 DIAGNOSIS — H401111 Primary open-angle glaucoma, right eye, mild stage: Secondary | ICD-10-CM | POA: Diagnosis not present

## 2018-10-02 DIAGNOSIS — H401122 Primary open-angle glaucoma, left eye, moderate stage: Secondary | ICD-10-CM | POA: Diagnosis not present

## 2018-10-02 DIAGNOSIS — H04123 Dry eye syndrome of bilateral lacrimal glands: Secondary | ICD-10-CM | POA: Diagnosis not present

## 2018-10-16 DIAGNOSIS — L501 Idiopathic urticaria: Secondary | ICD-10-CM | POA: Diagnosis not present

## 2018-10-17 ENCOUNTER — Ambulatory Visit (INDEPENDENT_AMBULATORY_CARE_PROVIDER_SITE_OTHER): Payer: Medicare Other | Admitting: *Deleted

## 2018-10-17 ENCOUNTER — Other Ambulatory Visit: Payer: Self-pay

## 2018-10-17 DIAGNOSIS — L501 Idiopathic urticaria: Secondary | ICD-10-CM

## 2018-10-22 ENCOUNTER — Other Ambulatory Visit: Payer: Self-pay | Admitting: Gynecology

## 2018-10-22 DIAGNOSIS — Z1231 Encounter for screening mammogram for malignant neoplasm of breast: Secondary | ICD-10-CM

## 2018-10-24 ENCOUNTER — Other Ambulatory Visit: Payer: Federal, State, Local not specified - PPO

## 2018-10-24 ENCOUNTER — Other Ambulatory Visit: Payer: Self-pay

## 2018-10-24 DIAGNOSIS — E559 Vitamin D deficiency, unspecified: Secondary | ICD-10-CM

## 2018-10-25 LAB — VITAMIN D 25 HYDROXY (VIT D DEFICIENCY, FRACTURES): Vit D, 25-Hydroxy: 31 ng/mL (ref 30–100)

## 2018-11-04 ENCOUNTER — Other Ambulatory Visit: Payer: Self-pay | Admitting: Cardiology

## 2018-11-05 NOTE — Telephone Encounter (Signed)
Please fill

## 2018-11-13 DIAGNOSIS — L501 Idiopathic urticaria: Secondary | ICD-10-CM | POA: Diagnosis not present

## 2018-11-14 ENCOUNTER — Ambulatory Visit (INDEPENDENT_AMBULATORY_CARE_PROVIDER_SITE_OTHER): Payer: Medicare Other | Admitting: *Deleted

## 2018-11-14 ENCOUNTER — Other Ambulatory Visit: Payer: Self-pay

## 2018-11-14 DIAGNOSIS — L501 Idiopathic urticaria: Secondary | ICD-10-CM | POA: Diagnosis not present

## 2018-11-15 ENCOUNTER — Other Ambulatory Visit: Payer: Self-pay | Admitting: Cardiology

## 2018-11-19 DIAGNOSIS — F064 Anxiety disorder due to known physiological condition: Secondary | ICD-10-CM | POA: Diagnosis not present

## 2018-11-19 DIAGNOSIS — Z6841 Body Mass Index (BMI) 40.0 and over, adult: Secondary | ICD-10-CM | POA: Diagnosis not present

## 2018-11-19 DIAGNOSIS — I110 Hypertensive heart disease with heart failure: Secondary | ICD-10-CM | POA: Diagnosis not present

## 2018-11-19 DIAGNOSIS — I10 Essential (primary) hypertension: Secondary | ICD-10-CM | POA: Diagnosis not present

## 2018-11-19 DIAGNOSIS — I509 Heart failure, unspecified: Secondary | ICD-10-CM | POA: Diagnosis not present

## 2018-11-27 ENCOUNTER — Encounter: Payer: Self-pay | Admitting: Gynecology

## 2018-11-27 ENCOUNTER — Other Ambulatory Visit: Payer: Self-pay

## 2018-11-27 ENCOUNTER — Ambulatory Visit (INDEPENDENT_AMBULATORY_CARE_PROVIDER_SITE_OTHER): Payer: Medicare Other | Admitting: Gynecology

## 2018-11-27 VITALS — BP 120/78 | Ht 62.0 in | Wt 264.0 lb

## 2018-11-27 DIAGNOSIS — Z01419 Encounter for gynecological examination (general) (routine) without abnormal findings: Secondary | ICD-10-CM | POA: Diagnosis not present

## 2018-11-27 DIAGNOSIS — Z8742 Personal history of other diseases of the female genital tract: Secondary | ICD-10-CM | POA: Diagnosis not present

## 2018-11-27 DIAGNOSIS — M810 Age-related osteoporosis without current pathological fracture: Secondary | ICD-10-CM

## 2018-11-27 DIAGNOSIS — N952 Postmenopausal atrophic vaginitis: Secondary | ICD-10-CM | POA: Diagnosis not present

## 2018-11-27 NOTE — Patient Instructions (Signed)
Follow-up 1 year for annual exam.  We will plan on your bone density next year.

## 2018-11-27 NOTE — Progress Notes (Signed)
Kimberly Crosby 24-Apr-1941 [id number]        78 y.o.  [id number] for breast and pelvic exam.  Without gynecologic complaints.  Several issues noted below.  Past medical history,surgical history, problem list, medications, allergies, family history and social history were all reviewed and documented as reviewed in the EPIC chart.  ROS:  Performed with pertinent positives and negatives included in the history, assessment and plan.   Additional significant findings : None   Exam: Caryn Bee assistant Vitals:   11/27/18 1023  BP: 120/78  Weight: 264 lb (119.7 kg)  Height: 5\' 2"  (1.575 m)   Body mass index is 48.29 kg/m.  General appearance:  Normal affect, orientation and appearance. Skin: Grossly normal HEENT: Without gross lesions.  No cervical or supraclavicular adenopathy. Thyroid normal.  Lungs:  Clear without wheezing, rales or rhonchi Cardiac: RR, without RMG Abdominal:  Soft, nontender, without masses, guarding, rebound, organomegaly or hernia Breasts:  Examined lying and sitting without masses, retractions, discharge or axillary adenopathy. Pelvic:  Ext, BUS, Vagina: With atrophic changes  Cervix: With atrophic changes  Uterus: Difficult to palpate but no gross masses or tenderness  Adnexa: Without gross masses or tenderness    Anus and perineum: Normal   Rectovaginal: Normal sphincter tone without palpated masses or tenderness.    Assessment/Plan:  78 y.o. [id number] female for breast and pelvic exam  1. Postmenopausal.  No significant menopausal symptoms or any vaginal bleeding. 2. History of left ovarian cyst and echogenic focus of the endometrium.  First mention 2012 on ultrasound with questionable endometrial polyp and small left cyst.  Follow-up ultrasound 2017 showed avascular cystic area left 30 mm with endometrial echo 5.8 mm.  Attempted sonohysterogram unsuccessful unable to enter the cavity and no sampling done.  On follow-up ultrasounds a 10 mm endometrial  defect was noted which remained stable on serial ultrasounds.  Most recent ultrasound 03/2017 showed cystic area left adnexa 37 mm with negative flow and endometrial echo stable at 10 mm.  CA 125's were normal.  We initially discussed whether to proceed with hysteroscopy D&C noting difficulty in dilating her cervix versus serial ultrasounds excepting the risks of missed pathology.  She was comfortable with serial ultrasounds which remained stable.  We again discussed this today and whether to relook with ultrasound now versus continued expectant management given no symptoms and no bleeding accepting the possibility of missed pathology.  Patient prefers expectant management excepting the disclaimer.  Will call if any issues. 3. Osteoporosis.  DEXA 2019 T score distal third of radius -3.4.  Other measurements worst score is -2.1 stable from prior DEXA's.  We discussed treatment options last year and she declined treatment but preferred monitoring with repeat DEXA next year at 2-year interval.  She is on extra vitamin D.  Most recent vitamin D level 31 up from 26 previously.  She is continuing on her vitamin D supplementation.  We will plan DEXA next year at 2-year interval. 4. Mammography due now and she will schedule in follow-up for this.  Breast exam normal today. 5. Colonoscopy 2017.  Repeat at their recommended interval. 6. Pap smear 2017.  No Pap smear done today.  No history of abnormal Pap smears.  Per current screening guidelines we both feel comfortable stop screening based on age. 7. Health maintenance.  No routine lab work done as patient does this elsewhere.  Follow-up 1 year, sooner as needed.   Anastasio Auerbach MD, 10:50 AM 11/27/2018

## 2018-11-28 ENCOUNTER — Ambulatory Visit (INDEPENDENT_AMBULATORY_CARE_PROVIDER_SITE_OTHER): Payer: Medicare Other | Admitting: Cardiology

## 2018-11-28 ENCOUNTER — Encounter: Payer: Self-pay | Admitting: Cardiology

## 2018-11-28 VITALS — BP 142/74 | HR 93 | Ht 63.0 in | Wt 263.8 lb

## 2018-11-28 DIAGNOSIS — I10 Essential (primary) hypertension: Secondary | ICD-10-CM

## 2018-11-28 DIAGNOSIS — I5032 Chronic diastolic (congestive) heart failure: Secondary | ICD-10-CM | POA: Diagnosis not present

## 2018-11-28 DIAGNOSIS — M7989 Other specified soft tissue disorders: Secondary | ICD-10-CM

## 2018-11-28 NOTE — Patient Instructions (Signed)
Calorie Counting for Weight Loss Calories are units of energy. Your body needs a certain amount of calories from food to keep you going throughout the day. When you eat more calories than your body needs, your body stores the extra calories as fat. When you eat fewer calories than your body needs, your body burns fat to get the energy it needs. Calorie counting means keeping track of how many calories you eat and drink each day. Calorie counting can be helpful if you need to lose weight. If you make sure to eat fewer calories than your body needs, you should lose weight. Ask your health care provider what a healthy weight is for you. For calorie counting to work, you will need to eat the right number of calories in a day in order to lose a healthy amount of weight per week. A dietitian can help you determine how many calories you need in a day and will give you suggestions on how to reach your calorie goal.  A healthy amount of weight to lose per week is usually 1-2 lb (0.5-0.9 kg). This usually means that your daily calorie intake should be reduced by 500-750 calories.  Eating 1,200 - 1,500 calories per day can help most women lose weight.  Eating 1,500 - 1,800 calories per day can help most men lose weight. What is my plan? My goal is to have __________ calories per day. If I have this many calories per day, I should lose around __________ pounds per week. What do I need to know about calorie counting? In order to meet your daily calorie goal, you will need to:  Find out how many calories are in each food you would like to eat. Try to do this before you eat.  Decide how much of the food you plan to eat.  Write down what you ate and how many calories it had. Doing this is called keeping a food log. To successfully lose weight, it is important to balance calorie counting with a healthy lifestyle that includes regular activity. Aim for 150 minutes of moderate exercise (such as walking) or 75  minutes of vigorous exercise (such as running) each week. Where do I find calorie information?  The number of calories in a food can be found on a Nutrition Facts label. If a food does not have a Nutrition Facts label, try to look up the calories online or ask your dietitian for help. Remember that calories are listed per serving. If you choose to have more than one serving of a food, you will have to multiply the calories per serving by the amount of servings you plan to eat. For example, the label on a package of bread might say that a serving size is 1 slice and that there are 90 calories in a serving. If you eat 1 slice, you will have eaten 90 calories. If you eat 2 slices, you will have eaten 180 calories. How do I keep a food log? Immediately after each meal, record the following information in your food log:  What you ate. Don't forget to include toppings, sauces, and other extras on the food.  How much you ate. This can be measured in cups, ounces, or number of items.  How many calories each food and drink had.  The total number of calories in the meal. Keep your food log near you, such as in a small notebook in your pocket, or use a mobile app or website. Some programs will calculate   calories for you and show you how many calories you have left for the day to meet your goal. What are some calorie counting tips?   Use your calories on foods and drinks that will fill you up and not leave you hungry: ? Some examples of foods that fill you up are nuts and nut butters, vegetables, lean proteins, and high-fiber foods like whole grains. High-fiber foods are foods with more than 5 g fiber per serving. ? Drinks such as sodas, specialty coffee drinks, alcohol, and juices have a lot of calories, yet do not fill you up.  Eat nutritious foods and avoid empty calories. Empty calories are calories you get from foods or beverages that do not have many vitamins or protein, such as candy, sweets, and  soda. It is better to have a nutritious high-calorie food (such as an avocado) than a food with few nutrients (such as a bag of chips).  Know how many calories are in the foods you eat most often. This will help you calculate calorie counts faster.  Pay attention to calories in drinks. Low-calorie drinks include water and unsweetened drinks.  Pay attention to nutrition labels for "low fat" or "fat free" foods. These foods sometimes have the same amount of calories or more calories than the full fat versions. They also often have added sugar, starch, or salt, to make up for flavor that was removed with the fat.  Find a way of tracking calories that works for you. Get creative. Try different apps or programs if writing down calories does not work for you. What are some portion control tips?  Know how many calories are in a serving. This will help you know how many servings of a certain food you can have.  Use a measuring cup to measure serving sizes. You could also try weighing out portions on a kitchen scale. With time, you will be able to estimate serving sizes for some foods.  Take some time to put servings of different foods on your favorite plates, bowls, and cups so you know what a serving looks like.  Try not to eat straight from a bag or box. Doing this can lead to overeating. Put the amount you would like to eat in a cup or on a plate to make sure you are eating the right portion.  Use smaller plates, glasses, and bowls to prevent overeating.  Try not to multitask (for example, watch TV or use your computer) while eating. If it is time to eat, sit down at a table and enjoy your food. This will help you to know when you are full. It will also help you to be aware of what you are eating and how much you are eating. What are tips for following this plan? Reading food labels  Check the calorie count compared to the serving size. The serving size may be smaller than what you are used to  eating.  Check the source of the calories. Make sure the food you are eating is high in vitamins and protein and low in saturated and trans fats. Shopping  Read nutrition labels while you shop. This will help you make healthy decisions before you decide to purchase your food.  Make a grocery list and stick to it. Cooking  Try to cook your favorite foods in a healthier way. For example, try baking instead of frying.  Use low-fat dairy products. Meal planning  Use more fruits and vegetables. Half of your plate should be fruits   and vegetables.  Include lean proteins like poultry and fish. How do I count calories when eating out?  Ask for smaller portion sizes.  Consider sharing an entree and sides instead of getting your own entree.  If you get your own entree, eat only half. Ask for a box at the beginning of your meal and put the rest of your entree in it so you are not tempted to eat it.  If calories are listed on the menu, choose the lower calorie options.  Choose dishes that include vegetables, fruits, whole grains, low-fat dairy products, and lean protein.  Choose items that are boiled, broiled, grilled, or steamed. Stay away from items that are buttered, battered, fried, or served with cream sauce. Items labeled "crispy" are usually fried, unless stated otherwise.  Choose water, low-fat milk, unsweetened iced tea, or other drinks without added sugar. If you want an alcoholic beverage, choose a lower calorie option such as a glass of wine or light beer.  Ask for dressings, sauces, and syrups on the side. These are usually high in calories, so you should limit the amount you eat.  If you want a salad, choose a garden salad and ask for grilled meats. Avoid extra toppings like bacon, cheese, or fried items. Ask for the dressing on the side, or ask for olive oil and vinegar or lemon to use as dressing.  Estimate how many servings of a food you are given. For example, a serving of  cooked rice is  cup or about the size of half a baseball. Knowing serving sizes will help you be aware of how much food you are eating at restaurants. The list below tells you how big or small some common portion sizes are based on everyday objects: ? 1 oz-4 stacked dice. ? 3 oz-1 deck of cards. ? 1 tsp-1 die. ? 1 Tbsp- a ping-pong ball. ? 2 Tbsp-1 ping-pong ball. ?  cup- baseball. ? 1 cup-1 baseball. Summary  Calorie counting means keeping track of how many calories you eat and drink each day. If you eat fewer calories than your body needs, you should lose weight.  A healthy amount of weight to lose per week is usually 1-2 lb (0.5-0.9 kg). This usually means reducing your daily calorie intake by 500-750 calories.  The number of calories in a food can be found on a Nutrition Facts label. If a food does not have a Nutrition Facts label, try to look up the calories online or ask your dietitian for help.  Use your calories on foods and drinks that will fill you up, and not on foods and drinks that will leave you hungry.  Use smaller plates, glasses, and bowls to prevent overeating. This information is not intended to replace advice given to you by your health care provider. Make sure you discuss any questions you have with your health care provider. Document Released: 04/24/2005 Document Revised: 01/11/2018 Document Reviewed: 03/24/2016 Elsevier Patient Education  2020 [place]. Heart Failure, Diagnosis  Heart failure means that your heart is not able to pump blood in the right way. This makes it hard for your body to work well. Heart failure is usually a long-term (chronic) condition. You must take good care of yourself and follow your treatment plan from your doctor. What are the causes? This condition may be caused by:  High blood pressure.  Build up of cholesterol and fat in the arteries.  Heart attack. This injures the heart muscle.  Heart valves that  do not open and  close properly.  Damage of the heart muscle. This is also called cardiomyopathy.  Lung disease.  Abnormal heart rhythms. What increases the risk? The risk of heart failure goes up as a person ages. This condition is also more likely to develop in people who:  Are overweight.  Are female.  Smoke or chew tobacco.  Abuse alcohol or illegal drugs.  Have taken medicines that can damage the heart.  Have diabetes.  Have abnormal heart rhythms.  Have thyroid problems.  Have low blood counts (anemia). What are the signs or symptoms? Symptoms of this condition include:  Shortness of breath.  Coughing.  Swelling of the feet, ankles, legs, or belly.  Losing weight for no reason.  Trouble breathing.  Waking from sleep because of the need to sit up and get more air.  Rapid heartbeat.  Being very tired.  Feeling dizzy, or feeling like you may pass out (faint).  Having no desire to eat.  Feeling like you may vomit (nauseous).  Peeing (urinating) more at night.  Feeling confused. How is this treated?     This condition may be treated with:  Medicines. These can be given to treat blood pressure and to make the heart muscles stronger.  Changes in your daily life. These may include eating a healthy diet, staying at a healthy body weight, quitting tobacco and illegal drug use, or doing exercises.  Surgery. Surgery can be done to open blocked valves, or to put devices in the heart, such as pacemakers.  A donor heart (heart transplant). You will receive a healthy heart from a donor. Follow these instructions at home:  Treat other conditions as told by your doctor. These may include high blood pressure, diabetes, thyroid disease, or abnormal heart rhythms.  Learn as much as you can about heart failure.  Get support as you need it.  Keep all follow-up visits as told by your doctor. This is important. Summary  Heart failure means that your heart is not able to pump  blood in the right way.  This condition is caused by high blood pressure, heart attack, or damage of the heart muscle.  Symptoms of this condition include shortness of breath and swelling of the feet, ankles, legs, or belly. You may also feel very tired or feel like you may vomit.  You may be treated with medicines, surgery, or changes in your daily life.  Treat other health conditions as told by your doctor. This information is not intended to replace advice given to you by your health care provider. Make sure you discuss any questions you have with your health care provider. Document Released: 02/01/2008 Document Revised: 07/12/2018 Document Reviewed: 07/12/2018 Elsevier Patient Education  [place].

## 2018-11-28 NOTE — Progress Notes (Signed)
Primary Physician:  Lucianne Lei, MD   Patient ID: Kimberly Crosby, female    DOB: [birth year], 78 y.o.   MRN: [id number]  Subjective:    Chief Complaint  Patient presents with   Congestive Heart Failure   Follow-up    HPI: Kimberly Crosby  is a 78 y.o. female  with hypertension, obesity, and chronic diastolic heart failure.  Patient was last seen by [place] 1 year ago, but was lost to follow up. She underwent lexiscan nuclear stress test in march 2019 that was considered low risk study. Echo in may 2019 showed grade 2 diastolic dysfunction, normal LVEF. She was started on Aldactazide and symptoms improved. She recently ran out of her medication and had worsening dyspnea and leg swelling. She was recently seen by her PCP, who refilled her medication and patient was encouraged to follow up with [place].  Since being back on her medication, patient is feeling better and symptoms have improved. She is also on Entresto by her PCP. She does admit to some weight gain over the last few months and has not been exercising due to pandemic.     Past Medical History:  Diagnosis Date   Asthma    Breast cyst    Edema    Hypertension    Osteoporosis 11/2017   T score -3.4 distal third radius    Past Surgical History:  Procedure Laterality Date   BREAST EXCISIONAL BIOPSY Left    BREAST SURGERY     biopsy-cyst    Social History   Socioeconomic History   Marital status: Divorced    Spouse name: Not on file   Number of children: 3   Years of education: Not on file   Highest education level: Not on file  Occupational History   Not on file  Social Needs   Financial resource strain: Not on file   Food insecurity    Worry: Not on file    Inability: Not on file   Transportation needs    Medical: Not on file    Non-medical: Not on file  Tobacco Use   Smoking status: Former Smoker    Packs/day: 0.25    Years: 3.00    Pack years: 0.75    Types: Cigarettes    Quit date:  11/27/1993    Years since quitting: 25.0   Smokeless tobacco: Never Used  Substance and Sexual Activity   Alcohol use: Yes    Alcohol/week: 0.0 standard drinks    Comment: wine occassionally   Drug use: No   Sexual activity: Never    Comment: 1st intercourse 78 yo-Fewer than 5 partners  Lifestyle   Physical activity    Days per week: Not on file    Minutes per session: Not on file   Stress: Not on file  Relationships   Social connections    Talks on phone: Not on file    Gets together: Not on file    Attends religious service: Not on file    Active member of club or organization: Not on file    Attends meetings of clubs or organizations: Not on file    Relationship status: Not on file   Intimate partner violence    Fear of current or ex partner: Not on file    Emotionally abused: Not on file    Physically abused: Not on file    Forced sexual activity: Not on file  Other Topics Concern   Not on file  Social History  Narrative   Not on file    Review of Systems  Constitution: Negative for decreased appetite, malaise/fatigue, weight gain and weight loss.  Eyes: Negative for visual disturbance.  Cardiovascular: Positive for dyspnea on exertion (improved) and leg swelling (improved). Negative for chest pain, claudication, orthopnea, palpitations and syncope.  Respiratory: Negative for hemoptysis and wheezing.   Endocrine: Negative for cold intolerance and heat intolerance.  Hematologic/Lymphatic: Does not bruise/bleed easily.  Skin: Negative for nail changes.  Musculoskeletal: Negative for muscle weakness and myalgias.  Gastrointestinal: Negative for abdominal pain, change in bowel habit, nausea and vomiting.  Neurological: Negative for difficulty with concentration, dizziness, focal weakness and headaches.  Psychiatric/Behavioral: Negative for altered mental status and suicidal ideas.  All other systems reviewed and are negative.     Objective:  Blood pressure  (!) 142/74, pulse 93, height 5\' 3"  (1.6 m), weight 263 lb 12.8 oz (119.7 kg), SpO2 97 %. Body mass index is 46.73 kg/m.    Physical Exam  Constitutional: She is oriented to person, place, and time. Vital signs are normal. She appears well-developed and well-nourished.  Morbidly obese  HENT:  Head: Normocephalic and atraumatic.  Neck: Normal range of motion.  Cardiovascular: Normal rate, regular rhythm, normal heart sounds and intact distal pulses.  Pulses:      Femoral pulses are 1+ on the right side and 1+ on the left side.      Popliteal pulses are 1+ on the right side and 1+ on the left side.       Dorsalis pedis pulses are 2+ on the right side and 2+ on the left side.       Posterior tibial pulses are 2+ on the right side and 2+ on the left side.  Trace edema  Pulmonary/Chest: Effort normal and breath sounds normal. No accessory muscle usage. No respiratory distress.  Abdominal: Soft. Bowel sounds are normal.  Musculoskeletal: Normal range of motion.  Neurological: She is alert and oriented to person, place, and time.  Skin: Skin is warm and dry.  Vitals reviewed.  Radiology: No results found.  Laboratory examination:    CMP Latest Ref Rng & Units 06/30/2015  Glucose 65 - 99 mg/dL 110(H)  BUN 6 - 20 mg/dL 12  Creatinine 0.44 - 1.00 mg/dL 0.98  Sodium 135 - 145 mmol/L 142  Potassium 3.5 - 5.1 mmol/L 3.8  Chloride 101 - 111 mmol/L 108  CO2 22 - 32 mmol/L 25  Calcium 8.9 - 10.3 mg/dL 10.0  Total Protein 6.5 - 8.1 g/dL 7.8  Total Bilirubin 0.3 - 1.2 mg/dL 0.5  Alkaline Phos 38 - 126 U/L 77  AST 15 - 41 U/L 20  ALT 14 - 54 U/L 15   CBC Latest Ref Rng & Units 06/30/2015  WBC 4.0 - 10.5 K/uL 8.8  Hemoglobin 12.0 - 15.0 g/dL 14.4  Hematocrit 36.0 - 46.0 % 43.5  Platelets 150 - 400 K/uL 261   Lipid Panel  No results found for: CHOL, TRIG, HDL, CHOLHDL, VLDL, LDLCALC, LDLDIRECT HEMOGLOBIN A1C No results found for: HGBA1C, MPG TSH No results for input(s): TSH in the last  8760 hours.  PRN Meds:. Medications Discontinued During This Encounter  Medication Reason   montelukast (SINGULAIR) 10 MG tablet Discontinued by provider   ranitidine (ZANTAC) 150 MG capsule Discontinued by provider   spironolactone (ALDACTONE) 25 MG tablet Change in therapy   hydrochlorothiazide (HYDRODIURIL) 25 MG tablet Change in therapy   Current Meds  Medication Sig   fexofenadine (ALLEGRA)  180 MG tablet Take 1 tablet (180 mg total) by mouth daily.   latanoprost (XALATAN) 0.005 % ophthalmic solution at bedtime.    LORazepam (ATIVAN) 1 MG tablet as needed.    omalizumab (XOLAIR) 150 MG injection Inject into the skin every 14 (fourteen) days.   Omega-3 Fatty Acids (FISH OIL PO) Take by mouth daily.    Propylene Glycol (SYSTANE BALANCE OP) Apply to eye.   sacubitril-valsartan (ENTRESTO) 97-103 MG Take 1 tablet by mouth daily.    spironolactone-hydrochlorothiazide (ALDACTAZIDE) 25-25 MG tablet TAKE 1 TABLET BY MOUTH DAILY   Current Facility-Administered Medications for the 11/28/18 encounter (Office Visit) with Kimberly Dunn, NP  Medication   omalizumab Arvid Right) injection 300 mg    Cardiac Studies:   Echocardiogram 09/27/2017: Left ventricle cavity is normal in size. Mild concentric hypertrophy of the left ventricle. Normal global wall motion. Doppler evidence of grade II (pseudonormal) diastolic dysfunction. Diastolic dysfunction findings suggests elevated LA/LV end diastolic pressure. Calculated EF 68%. Left atrial cavity is moderately dilated at 4.5 cm. Mild tricuspid regurgitation. No evidence of pulmonary hypertension. Insignificant pericardial effusion.  Lexiscan myoview stress test 07/27/2017: 1. Pharmacologic stress testing was performed with intravenous administration of .4 mg of Lexiscan over a 10-15 seconds infusion. Normal resting blood pressure. Stress symptoms included dyspnea and headache. Exercise capacity not assessed. Stress EKG is non  diagnostic for ischemia as it is a pharmacologic stress. 2. The overall quality of the study is excellent. There is no evidence of abnormal lung activity. Stress and rest SPECT images demonstrate homogeneous tracer distribution throughout the myocardium. Gated SPECT imaging reveals normal myocardial thickening and wall motion. The left ventricular ejection fraction was normal (69%). 3. Low risk study.  Assessment:     ICD-10-CM   1. Chronic diastolic (congestive) heart failure (HCC)  I50.32 EKG 12-Lead  2. Essential hypertension  I10   3. Leg swelling  M79.89   4. Morbid obesity ([place])  E66.01    EKG 11/28/2018: Normal sinus rhythm at 89 bpm, normal axis, no evidence of ischemia. Low voltage complexes.   Recommendations:   Patient presents for follow up on chronic diastolic heart failure. She has recently had heart failure exacerbation when out of her medication; however, since being back on the aldactazide, symptoms have improved and she is feeling well. She reports recent labs with PCP, will request for our records. Blood pressure is elevated today; however, she reports is generally well controlled. Will continue to monitor. I suspect her weight gain may be contributing. Discussed the importance of diet modifications for weight loss with calorie restriction and regular exercise. I have encouraged her to resume regular walking that she previously did several times a week. No changes were made to medications as her symptom have improved. I would like to see her back in 8 weeks for follow up on her efforts toward weight loss and hypertension.   Kimberly Dunn, MSN, APRN, FNP-C [facility]. [place] Office: [phone] Fax: [phone]

## 2018-11-29 ENCOUNTER — Ambulatory Visit: Payer: Self-pay | Admitting: Cardiology

## 2018-12-05 ENCOUNTER — Other Ambulatory Visit: Payer: Self-pay

## 2018-12-05 DIAGNOSIS — Z20822 Contact with and (suspected) exposure to covid-19: Secondary | ICD-10-CM

## 2018-12-05 DIAGNOSIS — R6889 Other general symptoms and signs: Secondary | ICD-10-CM | POA: Diagnosis not present

## 2018-12-06 ENCOUNTER — Ambulatory Visit
Admission: RE | Admit: 2018-12-06 | Discharge: 2018-12-06 | Disposition: A | Discharge status: Home / Self Care | Payer: Medicare Other | Source: Ambulatory Visit | Attending: Gynecology | Admitting: Gynecology

## 2018-12-06 ENCOUNTER — Other Ambulatory Visit: Payer: Self-pay

## 2018-12-06 DIAGNOSIS — Z1231 Encounter for screening mammogram for malignant neoplasm of breast: Secondary | ICD-10-CM

## 2018-12-07 LAB — NOVEL CORONAVIRUS, NAA: SARS-CoV-2, NAA: NOT DETECTED

## 2018-12-11 DIAGNOSIS — L501 Idiopathic urticaria: Secondary | ICD-10-CM | POA: Diagnosis not present

## 2018-12-12 ENCOUNTER — Ambulatory Visit (INDEPENDENT_AMBULATORY_CARE_PROVIDER_SITE_OTHER): Payer: Medicare Other | Admitting: *Deleted

## 2018-12-12 ENCOUNTER — Other Ambulatory Visit: Payer: Self-pay

## 2018-12-12 DIAGNOSIS — L501 Idiopathic urticaria: Secondary | ICD-10-CM

## 2018-12-18 DIAGNOSIS — Z6841 Body Mass Index (BMI) 40.0 and over, adult: Secondary | ICD-10-CM | POA: Diagnosis not present

## 2018-12-18 DIAGNOSIS — I5030 Unspecified diastolic (congestive) heart failure: Secondary | ICD-10-CM | POA: Diagnosis not present

## 2018-12-18 DIAGNOSIS — I110 Hypertensive heart disease with heart failure: Secondary | ICD-10-CM | POA: Diagnosis not present

## 2018-12-18 DIAGNOSIS — E669 Obesity, unspecified: Secondary | ICD-10-CM | POA: Diagnosis not present

## 2019-01-08 DIAGNOSIS — L501 Idiopathic urticaria: Secondary | ICD-10-CM

## 2019-01-09 ENCOUNTER — Ambulatory Visit (INDEPENDENT_AMBULATORY_CARE_PROVIDER_SITE_OTHER): Payer: Medicare Other | Admitting: *Deleted

## 2019-01-09 DIAGNOSIS — L501 Idiopathic urticaria: Secondary | ICD-10-CM | POA: Diagnosis not present

## 2019-01-16 DIAGNOSIS — K5730 Diverticulosis of large intestine without perforation or abscess without bleeding: Secondary | ICD-10-CM | POA: Diagnosis not present

## 2019-01-16 DIAGNOSIS — Z86010 Personal history of colonic polyps: Secondary | ICD-10-CM | POA: Diagnosis not present

## 2019-01-16 DIAGNOSIS — Z1211 Encounter for screening for malignant neoplasm of colon: Secondary | ICD-10-CM | POA: Diagnosis not present

## 2019-01-16 DIAGNOSIS — K219 Gastro-esophageal reflux disease without esophagitis: Secondary | ICD-10-CM | POA: Diagnosis not present

## 2019-01-22 ENCOUNTER — Ambulatory Visit: Payer: Federal, State, Local not specified - PPO | Admitting: Cardiology

## 2019-01-29 ENCOUNTER — Ambulatory Visit (INDEPENDENT_AMBULATORY_CARE_PROVIDER_SITE_OTHER): Payer: Medicare Other | Admitting: Cardiology

## 2019-01-29 ENCOUNTER — Other Ambulatory Visit: Payer: Self-pay

## 2019-01-29 ENCOUNTER — Encounter: Payer: Self-pay | Admitting: Cardiology

## 2019-01-29 VITALS — BP 126/71 | HR 100 | Temp 97.7°F | Ht 62.0 in | Wt 262.9 lb

## 2019-01-29 DIAGNOSIS — I5032 Chronic diastolic (congestive) heart failure: Secondary | ICD-10-CM

## 2019-01-29 DIAGNOSIS — I10 Essential (primary) hypertension: Secondary | ICD-10-CM | POA: Diagnosis not present

## 2019-01-29 NOTE — Progress Notes (Signed)
Primary Physician:  Lucianne Lei, MD   Patient ID: Kimberly Crosby, female    DOB: 02-18-1941, 78 y.o.   MRN: [id number]  Subjective:    Chief Complaint  Patient presents with  . Follow-up  . Hypertension  . diastolic heart failure    HPI: Kimberly Crosby  is a 78 y.o. female  with hypertension, obesity, and chronic diastolic heart failure.  She was recently seen for follow up for leg swelling and worsening dyspnea, but this improved with resuming her previous medications. She underwent lexiscan nuclear stress test in march 2019 that was considered low risk study. Echo in may 2019 showed grade 2 diastolic dysfunction, normal LVEF. She was started on Aldactazide and symptoms improved. She recently ran out of her medication and had worsening dyspnea and leg swelling.  Since last seen by me, she reports that she has followed up with her PCP and that her labs have improved. She continues to feel well, much better than before. She has recently started walking more.   She is on aldactazide and also on Entresto by her PCP.     Past Medical History:  Diagnosis Date  . Asthma   . Breast cyst   . Edema   . Hypertension   . Osteoporosis 11/2017   T score -3.4 distal third radius    Past Surgical History:  Procedure Laterality Date  . BREAST EXCISIONAL BIOPSY Left   . BREAST SURGERY     biopsy-cyst    Social History   Socioeconomic History  . Marital status: Divorced    Spouse name: Not on file  . Number of children: 3  . Years of education: Not on file  . Highest education level: Not on file  Occupational History  . Not on file  Social Needs  . Financial resource strain: Not on file  . Food insecurity    Worry: Not on file    Inability: Not on file  . Transportation needs    Medical: Not on file    Non-medical: Not on file  Tobacco Use  . Smoking status: Former Smoker    Packs/day: 0.25    Years: 3.00    Pack years: 0.75    Types: Cigarettes    Quit date:  11/27/1993    Years since quitting: 25.1  . Smokeless tobacco: Never Used  Substance and Sexual Activity  . Alcohol use: Yes    Alcohol/week: 0.0 standard drinks    Comment: wine occassionally  . Drug use: No  . Sexual activity: Never    Comment: 1st intercourse 78 yo-Fewer than 5 partners  Lifestyle  . Physical activity    Days per week: Not on file    Minutes per session: Not on file  . Stress: Not on file  Relationships  . Social Herbalist on phone: Not on file    Gets together: Not on file    Attends religious service: Not on file    Active member of club or organization: Not on file    Attends meetings of clubs or organizations: Not on file    Relationship status: Not on file  . Intimate partner violence    Fear of current or ex partner: Not on file    Emotionally abused: Not on file    Physically abused: Not on file    Forced sexual activity: Not on file  Other Topics Concern  . Not on file  Social History Narrative  . Not  on file    Review of Systems  Constitution: Negative for decreased appetite, malaise/fatigue, weight gain and weight loss.  Eyes: Negative for visual disturbance.  Cardiovascular: Positive for dyspnea on exertion (improved) and leg swelling (improved). Negative for chest pain, claudication, orthopnea, palpitations and syncope.  Respiratory: Negative for hemoptysis and wheezing.   Endocrine: Negative for cold intolerance and heat intolerance.  Hematologic/Lymphatic: Does not bruise/bleed easily.  Skin: Negative for nail changes.  Musculoskeletal: Negative for muscle weakness and myalgias.  Gastrointestinal: Negative for abdominal pain, change in bowel habit, nausea and vomiting.  Neurological: Negative for difficulty with concentration, dizziness, focal weakness and headaches.  Psychiatric/Behavioral: Negative for altered mental status and suicidal ideas.  All other systems reviewed and are negative.     Objective:  Blood pressure  126/71, pulse 100, temperature 97.7 F (36.5 C), height 5' 2"  (1.575 m), weight 262 lb 14.4 oz (119.3 kg), SpO2 98 %. Body mass index is 48.09 kg/m.    Physical Exam  Constitutional: She is oriented to person, place, and time. Vital signs are normal. She appears well-developed and well-nourished.  Morbidly obese  HENT:  Head: Normocephalic and atraumatic.  Neck: Normal range of motion.  Cardiovascular: Normal rate, regular rhythm, normal heart sounds and intact distal pulses.  Pulses:      Femoral pulses are 1+ on the right side and 1+ on the left side.      Popliteal pulses are 1+ on the right side and 1+ on the left side.       Dorsalis pedis pulses are 2+ on the right side and 2+ on the left side.       Posterior tibial pulses are 2+ on the right side and 2+ on the left side.  Trace edema  Pulmonary/Chest: Effort normal and breath sounds normal. No accessory muscle usage. No respiratory distress.  Abdominal: Soft. Bowel sounds are normal.  Musculoskeletal: Normal range of motion.  Neurological: She is alert and oriented to person, place, and time.  Skin: Skin is warm and dry.  Vitals reviewed.  Radiology: No results found.  Laboratory examination:   11/20/2018: Cholesterol 175, triglycerides 46, HDL 74, LDL 87.  Creatinine 0.86, EGFR 65/35, potassium 4.8, CMP normal.  CBC normal.  BNP 123.  CMP Latest Ref Rng & Units 06/30/2015  Glucose 65 - 99 mg/dL 110(H)  BUN 6 - 20 mg/dL 12  Creatinine 0.44 - 1.00 mg/dL 0.98  Sodium 135 - 145 mmol/L 142  Potassium 3.5 - 5.1 mmol/L 3.8  Chloride 101 - 111 mmol/L 108  CO2 22 - 32 mmol/L 25  Calcium 8.9 - 10.3 mg/dL 10.0  Total Protein 6.5 - 8.1 g/dL 7.8  Total Bilirubin 0.3 - 1.2 mg/dL 0.5  Alkaline Phos 38 - 126 U/L 77  AST 15 - 41 U/L 20  ALT 14 - 54 U/L 15   CBC Latest Ref Rng & Units 06/30/2015  WBC 4.0 - 10.5 K/uL 8.8  Hemoglobin 12.0 - 15.0 g/dL 14.4  Hematocrit 36.0 - 46.0 % 43.5  Platelets 150 - 400 K/uL 261   Lipid Panel   No results found for: CHOL, TRIG, HDL, CHOLHDL, VLDL, LDLCALC, LDLDIRECT HEMOGLOBIN A1C No results found for: HGBA1C, MPG TSH No results for input(s): TSH in the last 8760 hours.  PRN Meds:. There are no discontinued medications. Current Meds  Medication Sig  . fexofenadine (ALLEGRA) 180 MG tablet Take 1 tablet (180 mg total) by mouth daily.  Marland Kitchen latanoprost (XALATAN) 0.005 % ophthalmic solution at  bedtime.   Marland Kitchen LORazepam (ATIVAN) 1 MG tablet as needed.   Marland Kitchen omalizumab (XOLAIR) 150 MG injection Inject into the skin every 14 (fourteen) days.  . Omega-3 Fatty Acids (FISH OIL PO) Take by mouth daily.   Marland Kitchen Propylene Glycol (SYSTANE BALANCE OP) Apply to eye.  . sacubitril-valsartan (ENTRESTO) 97-103 MG Take 1 tablet by mouth daily.   Marland Kitchen spironolactone-hydrochlorothiazide (ALDACTAZIDE) 25-25 MG tablet TAKE 1 TABLET BY MOUTH DAILY   Current Facility-Administered Medications for the 01/29/19 encounter (Office Visit) with Miquel Dunn, NP  Medication  . omalizumab Arvid Right) injection 300 mg    Cardiac Studies:   Echocardiogram 09/27/2017: Left ventricle cavity is normal in size. Mild concentric hypertrophy of the left ventricle. Normal global wall motion. Doppler evidence of grade II (pseudonormal) diastolic dysfunction. Diastolic dysfunction findings suggests elevated LA/LV end diastolic pressure. Calculated EF 68%. Left atrial cavity is moderately dilated at 4.5 cm. Mild tricuspid regurgitation. No evidence of pulmonary hypertension. Insignificant pericardial effusion.  Lexiscan myoview stress test 07/27/2017: 1. Pharmacologic stress testing was performed with intravenous administration of .4 mg of Lexiscan over a 10-15 seconds infusion. Normal resting blood pressure. Stress symptoms included dyspnea and headache. Exercise capacity not assessed. Stress EKG is non diagnostic for ischemia as it is a pharmacologic stress. 2. The overall quality of the study is excellent. There is no  evidence of abnormal lung activity. Stress and rest SPECT images demonstrate homogeneous tracer distribution throughout the myocardium. Gated SPECT imaging reveals normal myocardial thickening and wall motion. The left ventricular ejection fraction was normal (69%). 3. Low risk study.  Assessment:     ICD-10-CM   1. Chronic diastolic (congestive) heart failure (HCC)  I50.32   2. Essential hypertension  I10   3. Morbid obesity ([place])  E66.01    EKG 11/28/2018: Normal sinus rhythm at 89 bpm, normal axis, no evidence of ischemia. Low voltage complexes.   Recommendations:   Patient is here for follow-up for diastolic heart failure.  She is presently doing well and reports significant improvement in her symptoms compared to previous.  She has now been walking more and is tolerating this well.  Is having some difficulty with sticking to her diet.  She was counseled on the importance of weight loss given her diastolic dysfunction.  I have reviewed labs from PCP office, had mild elevation in BNP in July, do not have her most recent results, but patient reports has improved.  I will continue with her present medications.  Patient is a candidate for deliver trial studying Farxiga in diastolic heart failure patients, she is interested in this. I will have her discuss with research today. I have recommended repeating her echocardiogram; however, hopefully this can be performed through the study. I will plan to see her back after her echocardiogram for follow up.  Miquel Dunn, MSN, APRN, FNP-C [facility]. [place] Office: [phone] Fax: [phone]

## 2019-01-31 DIAGNOSIS — I509 Heart failure, unspecified: Secondary | ICD-10-CM | POA: Diagnosis not present

## 2019-01-31 DIAGNOSIS — I10 Essential (primary) hypertension: Secondary | ICD-10-CM | POA: Diagnosis not present

## 2019-01-31 DIAGNOSIS — E6609 Other obesity due to excess calories: Secondary | ICD-10-CM | POA: Diagnosis not present

## 2019-01-31 DIAGNOSIS — R351 Nocturia: Secondary | ICD-10-CM | POA: Diagnosis not present

## 2019-02-05 ENCOUNTER — Telehealth: Payer: Self-pay

## 2019-02-05 DIAGNOSIS — H401122 Primary open-angle glaucoma, left eye, moderate stage: Secondary | ICD-10-CM | POA: Diagnosis not present

## 2019-02-05 DIAGNOSIS — I5032 Chronic diastolic (congestive) heart failure: Secondary | ICD-10-CM

## 2019-02-05 NOTE — Telephone Encounter (Signed)
Please put in order for [facility]

## 2019-02-06 ENCOUNTER — Ambulatory Visit: Payer: Federal, State, Local not specified - PPO

## 2019-02-06 DIAGNOSIS — L501 Idiopathic urticaria: Secondary | ICD-10-CM | POA: Diagnosis not present

## 2019-02-07 ENCOUNTER — Ambulatory Visit (INDEPENDENT_AMBULATORY_CARE_PROVIDER_SITE_OTHER): Payer: Medicare Other | Admitting: *Deleted

## 2019-02-07 ENCOUNTER — Other Ambulatory Visit: Payer: Self-pay

## 2019-02-07 DIAGNOSIS — L501 Idiopathic urticaria: Secondary | ICD-10-CM | POA: Diagnosis not present

## 2019-02-13 ENCOUNTER — Encounter: Payer: Self-pay | Admitting: Gynecology

## 2019-02-13 ENCOUNTER — Ambulatory Visit (INDEPENDENT_AMBULATORY_CARE_PROVIDER_SITE_OTHER): Payer: Medicare Other

## 2019-02-13 ENCOUNTER — Other Ambulatory Visit: Payer: Self-pay

## 2019-02-13 DIAGNOSIS — I5032 Chronic diastolic (congestive) heart failure: Secondary | ICD-10-CM | POA: Diagnosis not present

## 2019-02-26 ENCOUNTER — Encounter: Payer: Self-pay | Admitting: Cardiology

## 2019-02-26 ENCOUNTER — Ambulatory Visit (INDEPENDENT_AMBULATORY_CARE_PROVIDER_SITE_OTHER): Payer: Medicare Other | Admitting: Cardiology

## 2019-02-26 ENCOUNTER — Other Ambulatory Visit: Payer: Self-pay

## 2019-02-26 VITALS — BP 123/70 | HR 91 | Temp 95.0°F | Ht 62.0 in | Wt 266.7 lb

## 2019-02-26 DIAGNOSIS — I5032 Chronic diastolic (congestive) heart failure: Secondary | ICD-10-CM

## 2019-02-26 DIAGNOSIS — I10 Essential (primary) hypertension: Secondary | ICD-10-CM

## 2019-02-26 NOTE — Progress Notes (Signed)
Primary Physician:  Lucianne Lei, MD   Patient ID: Kimberly Crosby, female    DOB: 12-01-1940, 78 y.o.   MRN: [id number]  Subjective:    Chief Complaint  Patient presents with  . Congestive Heart Failure  . Follow-up    4 week    HPI: Kimberly Crosby  is a 78 y.o. female  with hypertension, obesity, and chronic diastolic heart failure.  She was recently seen for follow up for leg swelling and worsening dyspnea, but this improved with resuming her previous medications. She underwent lexiscan nuclear stress test in march 2019 that was considered low risk study. Echo in may 2019 showed grade 2 diastolic dysfunction, normal LVEF. She was started on Aldactazide and symptoms improved. She recently ran out of her medication and had worsening dyspnea and leg swelling, but since being back on medication symptoms have improved. She underwent repeat echocardiogram and now presents for follow up.  After her last office visit, she was screened for "deliver trial" and states that she was notified that she did not qualify.  She is overall doing well without any new complaints.  She is on aldactazide and also on Entresto by her PCP.     Past Medical History:  Diagnosis Date  . Asthma   . Breast cyst   . Edema   . Hypertension   . Osteoporosis 11/2017   T score -3.4 distal third radius    Past Surgical History:  Procedure Laterality Date  . BREAST EXCISIONAL BIOPSY Left   . BREAST SURGERY     biopsy-cyst    Social History   Socioeconomic History  . Marital status: Divorced    Spouse name: Not on file  . Number of children: 3  . Years of education: Not on file  . Highest education level: Not on file  Occupational History  . Not on file  Social Needs  . Financial resource strain: Not on file  . Food insecurity    Worry: Not on file    Inability: Not on file  . Transportation needs    Medical: Not on file    Non-medical: Not on file  Tobacco Use  . Smoking status: Former  Smoker    Packs/day: 0.25    Years: 3.00    Pack years: 0.75    Types: Cigarettes    Quit date: 11/27/1993    Years since quitting: 25.2  . Smokeless tobacco: Never Used  Substance and Sexual Activity  . Alcohol use: Yes    Alcohol/week: 0.0 standard drinks    Comment: wine occassionally  . Drug use: No  . Sexual activity: Never    Comment: 1st intercourse 78 yo-Fewer than 5 partners  Lifestyle  . Physical activity    Days per week: Not on file    Minutes per session: Not on file  . Stress: Not on file  Relationships  . Social Herbalist on phone: Not on file    Gets together: Not on file    Attends religious service: Not on file    Active member of club or organization: Not on file    Attends meetings of clubs or organizations: Not on file    Relationship status: Not on file  . Intimate partner violence    Fear of current or ex partner: Not on file    Emotionally abused: Not on file    Physically abused: Not on file    Forced sexual activity: Not on file  Other Topics Concern  . Not on file  Social History Narrative  . Not on file    Review of Systems  Constitution: Negative for decreased appetite, malaise/fatigue, weight gain and weight loss.  Eyes: Negative for visual disturbance.  Cardiovascular: Positive for dyspnea on exertion (improved) and leg swelling (improved). Negative for chest pain, claudication, orthopnea, palpitations and syncope.  Respiratory: Negative for hemoptysis and wheezing.   Endocrine: Negative for cold intolerance and heat intolerance.  Hematologic/Lymphatic: Does not bruise/bleed easily.  Skin: Negative for nail changes.  Musculoskeletal: Negative for muscle weakness and myalgias.  Gastrointestinal: Negative for abdominal pain, change in bowel habit, nausea and vomiting.  Neurological: Negative for difficulty with concentration, dizziness, focal weakness and headaches.  Psychiatric/Behavioral: Negative for altered mental status and  suicidal ideas.  All other systems reviewed and are negative.     Objective:  Blood pressure 123/70, pulse 91, temperature (!) 95 F (35 C), height _0  (1.575 m), weight 266 lb 11.2 oz (121 kg), SpO2 98 %. Body mass index is 48.78 kg/m.    Physical Exam  Constitutional: She is oriented to person, place, and time. Vital signs are normal. She appears well-developed and well-nourished.  Morbidly obese  HENT:  Head: Normocephalic and atraumatic.  Neck: Normal range of motion.  Cardiovascular: Normal rate, regular rhythm, normal heart sounds and intact distal pulses.  Pulses:      Femoral pulses are 1+ on the right side and 1+ on the left side.      Popliteal pulses are 1+ on the right side and 1+ on the left side.       Dorsalis pedis pulses are 2+ on the right side and 2+ on the left side.       Posterior tibial pulses are 2+ on the right side and 2+ on the left side.  Trace edema  Pulmonary/Chest: Effort normal and breath sounds normal. No accessory muscle usage. No respiratory distress.  Abdominal: Soft. Bowel sounds are normal.  Musculoskeletal: Normal range of motion.  Neurological: She is alert and oriented to person, place, and time.  Skin: Skin is warm and dry.  Vitals reviewed.  Radiology: No results found.  Laboratory examination:   11/20/2018: Cholesterol 175, triglycerides 46, HDL 74, LDL 87.  Creatinine 0.86, EGFR 65/35, potassium 4.8, CMP normal.  CBC normal.  BNP 123.  CMP Latest Ref Rng & Units 06/30/2015  Glucose 65 - 99 mg/dL 110(H)  BUN 6 - 20 mg/dL 12  Creatinine 0.44 - 1.00 mg/dL 0.98  Sodium 135 - 145 mmol/L 142  Potassium 3.5 - 5.1 mmol/L 3.8  Chloride 101 - 111 mmol/L 108  CO2 22 - 32 mmol/L 25  Calcium 8.9 - 10.3 mg/dL 10.0  Total Protein 6.5 - 8.1 g/dL 7.8  Total Bilirubin 0.3 - 1.2 mg/dL 0.5  Alkaline Phos 38 - 126 U/L 77  AST 15 - 41 U/L 20  ALT 14 - 54 U/L 15   CBC Latest Ref Rng & Units 06/30/2015  WBC 4.0 - 10.5 K/uL 8.8  Hemoglobin 12.0  - 15.0 g/dL 14.4  Hematocrit 36.0 - 46.0 % 43.5  Platelets 150 - 400 K/uL 261   Lipid Panel  No results found for: CHOL, TRIG, HDL, CHOLHDL, VLDL, LDLCALC, LDLDIRECT HEMOGLOBIN A1C No results found for: HGBA1C, MPG TSH No results for input(s): TSH in the last 8760 hours.  PRN Meds:. Medications Discontinued During This Encounter  Medication Reason  . omalizumab (XOLAIR) 150 MG injection Error   Current  Meds  Medication Sig  . fexofenadine (ALLEGRA) 180 MG tablet Take 1 tablet (180 mg total) by mouth daily.  Marland Kitchen latanoprost (XALATAN) 0.005 % ophthalmic solution at bedtime.   Marland Kitchen LORazepam (ATIVAN) 1 MG tablet as needed.   . Omega-3 Fatty Acids (FISH OIL PO) Take by mouth daily.   Marland Kitchen Propylene Glycol (SYSTANE BALANCE OP) Apply to eye.  . sacubitril-valsartan (ENTRESTO) 97-103 MG Take 1 tablet by mouth daily.   Marland Kitchen spironolactone-hydrochlorothiazide (ALDACTAZIDE) 25-25 MG tablet TAKE 1 TABLET BY MOUTH DAILY  . [DISCONTINUED] omalizumab (XOLAIR) 150 MG injection Inject into the skin every 14 (fourteen) days.   Current Facility-Administered Medications for the 02/26/19 encounter (Office Visit) with Miquel Dunn, NP  Medication  . omalizumab Arvid Right) injection 300 mg    Cardiac Studies:   Echocardiogram 02/13/2019:  Hyperdynamic LV systolic function with visual EF 65-70%. Left ventricle cavity is normal in size. Left ventricle regional wall motion findings: No wall motion abnormalities. Moderate concentric hypertrophy of the left ventricle. Doppler evidence of grade II (pseudonormal) diastolic dysfunction. Diastolic dysfunction findings suggests elevated LA/LV end diastolic pressure. Left atrial cavity is mildly dilated at 4 cm. Mild tricuspid regurgitation. PA systolic pressure upper normal at 30 mm Hg with RA pressure estimated at  3 mm Hg. RVSP measures 30 mmHg. Insignificant pericardial effusion. Compared to the study done on 09/27/2017, no significant change.  Lexiscan  myoview stress test 07/27/2017: 1. Pharmacologic stress testing was performed with intravenous administration of .4 mg of Lexiscan over a 10-15 seconds infusion. Normal resting blood pressure. Stress symptoms included dyspnea and headache. Exercise capacity not assessed. Stress EKG is non diagnostic for ischemia as it is a pharmacologic stress. 2. The overall quality of the study is excellent. There is no evidence of abnormal lung activity. Stress and rest SPECT images demonstrate homogeneous tracer distribution throughout the myocardium. Gated SPECT imaging reveals normal myocardial thickening and wall motion. The left ventricular ejection fraction was normal (69%). 3. Low risk study.  Assessment:     ICD-10-CM   1. Chronic diastolic (congestive) heart failure (HCC)  I50.32   2. Essential hypertension  I10   3. Morbid obesity ([place])  E66.01    EKG 11/28/2018: Normal sinus rhythm at 89 bpm, normal axis, no evidence of ischemia. Low voltage complexes.   Recommendations:   Patient is here for follow-up for diastolic heart failure. I have discussed recently obtained echocardiogram results with the patient, no changes compared to previous echo. Symptomatically she is doing well and her symptoms are stable. Will continue with current medications. Blood pressure is well controlled. She is on Entresto given by her PCP.  Unfortunately, she did has not had any significant changes in weight since last seen by me. I have encouraged her to continue to work on this with diet and daily exercise. I will see her back in 1 year or sooner if problems.   Miquel Dunn, MSN, APRN, FNP-C [facility]. [place] Office: [phone] Fax: [phone]

## 2019-02-28 DIAGNOSIS — Z1211 Encounter for screening for malignant neoplasm of colon: Secondary | ICD-10-CM | POA: Diagnosis not present

## 2019-02-28 DIAGNOSIS — K635 Polyp of colon: Secondary | ICD-10-CM | POA: Diagnosis not present

## 2019-02-28 DIAGNOSIS — K5730 Diverticulosis of large intestine without perforation or abscess without bleeding: Secondary | ICD-10-CM | POA: Diagnosis not present

## 2019-02-28 DIAGNOSIS — Z86010 Personal history of colonic polyps: Secondary | ICD-10-CM | POA: Diagnosis not present

## 2019-02-28 DIAGNOSIS — D124 Benign neoplasm of descending colon: Secondary | ICD-10-CM | POA: Diagnosis not present

## 2019-03-06 DIAGNOSIS — L501 Idiopathic urticaria: Secondary | ICD-10-CM | POA: Diagnosis not present

## 2019-03-07 ENCOUNTER — Ambulatory Visit (INDEPENDENT_AMBULATORY_CARE_PROVIDER_SITE_OTHER): Payer: Medicare Other | Admitting: *Deleted

## 2019-03-07 ENCOUNTER — Other Ambulatory Visit: Payer: Self-pay

## 2019-03-07 DIAGNOSIS — L501 Idiopathic urticaria: Secondary | ICD-10-CM | POA: Diagnosis not present

## 2019-03-07 MED ORDER — SPIRONOLACTONE-HCTZ 25-25 MG PO TABS: 1.0000 | ORAL_TABLET | Freq: Every day | ORAL | 0 refills | Status: DC

## 2019-04-04 DIAGNOSIS — L501 Idiopathic urticaria: Secondary | ICD-10-CM | POA: Diagnosis not present

## 2019-04-07 ENCOUNTER — Ambulatory Visit (INDEPENDENT_AMBULATORY_CARE_PROVIDER_SITE_OTHER): Payer: Medicare Other | Admitting: *Deleted

## 2019-04-07 ENCOUNTER — Other Ambulatory Visit: Payer: Self-pay

## 2019-04-07 DIAGNOSIS — L501 Idiopathic urticaria: Secondary | ICD-10-CM | POA: Diagnosis not present

## 2019-05-04 DIAGNOSIS — L501 Idiopathic urticaria: Secondary | ICD-10-CM | POA: Diagnosis not present

## 2019-05-05 ENCOUNTER — Ambulatory Visit (INDEPENDENT_AMBULATORY_CARE_PROVIDER_SITE_OTHER): Payer: Medicare Other

## 2019-05-05 ENCOUNTER — Other Ambulatory Visit: Payer: Self-pay

## 2019-05-05 DIAGNOSIS — L501 Idiopathic urticaria: Secondary | ICD-10-CM | POA: Diagnosis not present

## 2019-06-02 ENCOUNTER — Ambulatory Visit: Payer: Medicare Other

## 2019-06-04 DIAGNOSIS — L501 Idiopathic urticaria: Secondary | ICD-10-CM | POA: Diagnosis not present

## 2019-06-05 ENCOUNTER — Other Ambulatory Visit: Payer: Self-pay

## 2019-06-05 ENCOUNTER — Ambulatory Visit (INDEPENDENT_AMBULATORY_CARE_PROVIDER_SITE_OTHER): Payer: Medicare Other

## 2019-06-05 DIAGNOSIS — L501 Idiopathic urticaria: Secondary | ICD-10-CM

## 2019-07-03 ENCOUNTER — Ambulatory Visit: Payer: Self-pay

## 2019-07-10 DIAGNOSIS — L501 Idiopathic urticaria: Secondary | ICD-10-CM | POA: Diagnosis not present

## 2019-07-11 ENCOUNTER — Ambulatory Visit (INDEPENDENT_AMBULATORY_CARE_PROVIDER_SITE_OTHER): Payer: Medicare Other | Admitting: *Deleted

## 2019-07-11 ENCOUNTER — Other Ambulatory Visit: Payer: Self-pay

## 2019-07-11 DIAGNOSIS — L501 Idiopathic urticaria: Secondary | ICD-10-CM | POA: Diagnosis not present

## 2019-08-08 DIAGNOSIS — L501 Idiopathic urticaria: Secondary | ICD-10-CM | POA: Diagnosis not present

## 2019-08-11 ENCOUNTER — Ambulatory Visit (INDEPENDENT_AMBULATORY_CARE_PROVIDER_SITE_OTHER): Payer: Medicare Other

## 2019-08-11 ENCOUNTER — Other Ambulatory Visit: Payer: Self-pay

## 2019-08-11 DIAGNOSIS — L501 Idiopathic urticaria: Secondary | ICD-10-CM

## 2019-08-18 ENCOUNTER — Other Ambulatory Visit: Payer: Self-pay | Admitting: Cardiology

## 2019-08-28 ENCOUNTER — Ambulatory Visit: Payer: Medicare Other | Attending: Internal Medicine

## 2019-08-28 DIAGNOSIS — Z23 Encounter for immunization: Secondary | ICD-10-CM

## 2019-08-28 NOTE — Progress Notes (Signed)
   Covid-19 Vaccination Clinic  Name:  LEILENE ONUFER    MRN: [id number] DOB: March 25, 1941  08/28/2019  Ms. Hermon was observed post Covid-19 immunization for 15 minutes without incident. She was provided with Vaccine Information Sheet and instruction to access the V-Safe system.   Ms. Lewers was instructed to call 911 with any severe reactions post vaccine: Marland Kitchen Difficulty breathing  . Swelling of face and throat  . A fast heartbeat  . A bad rash all over body  . Dizziness and weakness   Immunizations Administered    Name Date Dose VIS Date Route   Pfizer COVID-19 Vaccine 08/28/2019  2:47 PM 0.3 mL 07/02/2018 Intramuscular   Manufacturer: [place]   Lot: [id number]   [place]: [id number]

## 2019-09-02 DIAGNOSIS — H401111 Primary open-angle glaucoma, right eye, mild stage: Secondary | ICD-10-CM | POA: Diagnosis not present

## 2019-09-02 DIAGNOSIS — H52203 Unspecified astigmatism, bilateral: Secondary | ICD-10-CM | POA: Diagnosis not present

## 2019-09-02 DIAGNOSIS — H401122 Primary open-angle glaucoma, left eye, moderate stage: Secondary | ICD-10-CM | POA: Diagnosis not present

## 2019-09-02 DIAGNOSIS — Z961 Presence of intraocular lens: Secondary | ICD-10-CM | POA: Diagnosis not present

## 2019-09-05 DIAGNOSIS — L501 Idiopathic urticaria: Secondary | ICD-10-CM | POA: Diagnosis not present

## 2019-09-08 ENCOUNTER — Other Ambulatory Visit: Payer: Self-pay

## 2019-09-08 ENCOUNTER — Ambulatory Visit (INDEPENDENT_AMBULATORY_CARE_PROVIDER_SITE_OTHER): Payer: Medicare Other

## 2019-09-08 DIAGNOSIS — L501 Idiopathic urticaria: Secondary | ICD-10-CM | POA: Diagnosis not present

## 2019-09-25 ENCOUNTER — Ambulatory Visit: Payer: Medicare Other | Attending: Internal Medicine

## 2019-09-25 DIAGNOSIS — Z23 Encounter for immunization: Secondary | ICD-10-CM

## 2019-09-25 NOTE — Progress Notes (Signed)
   Covid-19 Vaccination Clinic  Name:  Kimberly Crosby    MRN: [id number] DOB: May 10, 1940  09/25/2019  [name] was observed post Covid-19 immunization for 15 minutes without incident. She was provided with Vaccine Information Sheet and instruction to access the V-Safe system.   [name] was instructed to call 911 with any severe reactions post vaccine: Marland Kitchen Difficulty breathing  . Swelling of face and throat  . A fast heartbeat  . A bad rash all over body  . Dizziness and weakness   Immunizations Administered    Name Date Dose VIS Date Route   Pfizer COVID-19 Vaccine 09/25/2019 11:48 AM 0.3 mL 07/02/2018 Intramuscular   Manufacturer: [place]   Lot: [id number]   [place]: [id number]

## 2019-10-03 DIAGNOSIS — L501 Idiopathic urticaria: Secondary | ICD-10-CM | POA: Diagnosis not present

## 2019-10-07 ENCOUNTER — Other Ambulatory Visit: Payer: Self-pay

## 2019-10-07 ENCOUNTER — Ambulatory Visit (INDEPENDENT_AMBULATORY_CARE_PROVIDER_SITE_OTHER): Payer: Medicare Other

## 2019-10-07 DIAGNOSIS — L501 Idiopathic urticaria: Secondary | ICD-10-CM

## 2019-11-03 DIAGNOSIS — L501 Idiopathic urticaria: Secondary | ICD-10-CM

## 2019-11-04 ENCOUNTER — Other Ambulatory Visit: Payer: Self-pay

## 2019-11-04 ENCOUNTER — Ambulatory Visit (INDEPENDENT_AMBULATORY_CARE_PROVIDER_SITE_OTHER): Payer: Medicare Other

## 2019-11-04 DIAGNOSIS — L501 Idiopathic urticaria: Secondary | ICD-10-CM | POA: Diagnosis not present

## 2019-11-25 ENCOUNTER — Other Ambulatory Visit: Payer: Self-pay | Admitting: Obstetrics and Gynecology

## 2019-11-25 DIAGNOSIS — Z1231 Encounter for screening mammogram for malignant neoplasm of breast: Secondary | ICD-10-CM

## 2019-12-02 ENCOUNTER — Ambulatory Visit: Payer: Self-pay

## 2019-12-05 DIAGNOSIS — L501 Idiopathic urticaria: Secondary | ICD-10-CM | POA: Diagnosis not present

## 2019-12-08 ENCOUNTER — Other Ambulatory Visit: Payer: Self-pay

## 2019-12-08 ENCOUNTER — Ambulatory Visit (INDEPENDENT_AMBULATORY_CARE_PROVIDER_SITE_OTHER): Payer: Medicare Other

## 2019-12-08 DIAGNOSIS — L501 Idiopathic urticaria: Secondary | ICD-10-CM

## 2019-12-17 ENCOUNTER — Ambulatory Visit
Admission: RE | Admit: 2019-12-17 | Discharge: 2019-12-17 | Disposition: A | Discharge status: Home / Self Care | Payer: Medicare Other | Source: Ambulatory Visit | Attending: Obstetrics and Gynecology | Admitting: Obstetrics and Gynecology

## 2019-12-17 ENCOUNTER — Other Ambulatory Visit: Payer: Self-pay

## 2019-12-17 DIAGNOSIS — Z1231 Encounter for screening mammogram for malignant neoplasm of breast: Secondary | ICD-10-CM

## 2020-01-02 DIAGNOSIS — L501 Idiopathic urticaria: Secondary | ICD-10-CM | POA: Diagnosis not present

## 2020-01-05 ENCOUNTER — Ambulatory Visit (INDEPENDENT_AMBULATORY_CARE_PROVIDER_SITE_OTHER): Payer: Medicare Other

## 2020-01-05 ENCOUNTER — Other Ambulatory Visit: Payer: Self-pay

## 2020-01-05 DIAGNOSIS — L501 Idiopathic urticaria: Secondary | ICD-10-CM | POA: Diagnosis not present

## 2020-01-06 DIAGNOSIS — H401111 Primary open-angle glaucoma, right eye, mild stage: Secondary | ICD-10-CM | POA: Diagnosis not present

## 2020-01-06 DIAGNOSIS — H401122 Primary open-angle glaucoma, left eye, moderate stage: Secondary | ICD-10-CM | POA: Diagnosis not present

## 2020-01-14 ENCOUNTER — Other Ambulatory Visit: Payer: Self-pay

## 2020-01-14 ENCOUNTER — Ambulatory Visit (INDEPENDENT_AMBULATORY_CARE_PROVIDER_SITE_OTHER): Payer: Medicare Other | Admitting: Obstetrics and Gynecology

## 2020-01-14 ENCOUNTER — Encounter: Payer: Self-pay | Admitting: Obstetrics and Gynecology

## 2020-01-14 VITALS — BP 126/80 | Ht 62.0 in | Wt 264.0 lb

## 2020-01-14 DIAGNOSIS — Z01419 Encounter for gynecological examination (general) (routine) without abnormal findings: Secondary | ICD-10-CM | POA: Diagnosis not present

## 2020-01-14 DIAGNOSIS — M810 Age-related osteoporosis without current pathological fracture: Secondary | ICD-10-CM | POA: Diagnosis not present

## 2020-01-14 DIAGNOSIS — E559 Vitamin D deficiency, unspecified: Secondary | ICD-10-CM | POA: Diagnosis not present

## 2020-01-14 NOTE — Progress Notes (Signed)
Kimberly Crosby April 26, 1941 [id number]  SUBJECTIVE:  79 y.o. [id number] female here for a breast and pelvic exam. She has no gynecologic concerns.  She describes occasional left lower quadrant pains.  Reports normal bowel movements and no constipation.  No vaginal bleeding or cramping.  Current Outpatient Medications  Medication Sig Dispense Refill  . fexofenadine (ALLEGRA) 180 MG tablet Take 1 tablet (180 mg total) by mouth daily. 30 tablet 5  . latanoprost (XALATAN) 0.005 % ophthalmic solution at bedtime.     Marland Kitchen LORazepam (ATIVAN) 1 MG tablet as needed.     . Omega-3 Fatty Acids (FISH OIL PO) Take by mouth daily.     Marland Kitchen Propylene Glycol (SYSTANE BALANCE OP) Apply to eye.    . sacubitril-valsartan (ENTRESTO) 97-103 MG Take 1 tablet by mouth daily.     Marland Kitchen spironolactone-hydrochlorothiazide (ALDACTAZIDE) 25-25 MG tablet TAKE 1 TABLET BY MOUTH DAILY 90 tablet 2   Current Facility-Administered Medications  Medication Dose Route Frequency Provider Last Rate Last Admin  . omalizumab Arvid Right) injection 300 mg  300 mg Subcutaneous Q28 days Kennith Gain, MD   300 mg at 01/05/20 1136   Allergies: Patient has no known allergies.  No LMP recorded. Patient is postmenopausal.  Past medical history,surgical history, problem list, medications, allergies, family history and social history were all reviewed and documented as reviewed in the EPIC chart.  GYN ROS: no abnormal bleeding, pelvic pain or discharge, no breast pain or new or enlarging lumps on self exam.  No dysuria, urinary frequency, pain with urination, cloudy/malodorous urine.   OBJECTIVE:  BP 126/80 (BP Location: Right Arm, Patient Position: Sitting, Cuff Size: Large)   Ht 5\' 2"  (1.575 m)   Wt 264 lb (119.7 kg)   BMI 48.29 kg/m  The patient appears well, alert, oriented, in no distress.  BREAST EXAM: breasts appear normal, no suspicious masses, no skin or nipple changes or axillary nodes  PELVIC EXAM: VULVA: normal appearing  vulva with atrophic change, no masses, tenderness or lesions, VAGINA: normal appearing vagina with atrophic change, normal color and discharge, no lesions, CERVIX: normal appearing cervix without discharge or lesions, UTERUS and ADNEXA: uterus and adnexa are difficult to palpate due to body habitus, no palpable masses or tenderness  Chaperone: Aurora Mask (DNP student) present during the examination and performed the pelvic exam with me in attendance to confirm the exam findings   ASSESSMENT:  79 y.o. [id number] here for a breast and pelvic exam  PLAN:   1. Postmenopausal.  No vaginal bleeding.  No hot flashes or night sweats. 2. Pap smear 2017.  No significant history of abnormal Pap smears.  She is comfortable with not continuing screening based on age criteria following the current screening guidelines. 3.  History of left ovarian cyst and echogenic focus in the endometrium.  First noted on the 2012 ultrasound.  Follow-up ultrasound 5 years later 2017 showed avascular cystic area 30 mm on the left side with endometrial thickness 5.8 mm and a sonohysterogram attempt was not successful at that time so no sampling was done.  There was noted to be a 10 mm endometrial defect on follow-up ultrasound.  Her last pelvic ultrasound on 03/27/2017 showing a left adnexal cyst 37 mm with negative flow, endometrial thickness still 10 mm, stable.  CA-125 was normal.  She has had discussion with Dr. Loetta Rough regarding possibility of hysteroscopy D&C versus serial ultrasound versus surveillance for symptoms, and she opted for the latter.  We again discussed this today,  especially in light of her mild and intermittent left lower quadrant pains, and she would rather not have another ultrasound at this time, understanding the disclaimer potential of missed uterine/ovarian pathology including potential malignancies.  She continues to not have any vaginal bleeding.  I encouraged her to let us know if she is having worsening  symptoms on the left side and/or vaginal bleeding and in that case we will really push for having her to repeat the pelvic ultrasound. 4. Mammogram 12/2019.  Normal breast exam today.  She will continue with annual mammograms. 5. Colonoscopy 2017.  She will follow up at the interval recommended by her GI specialist.   6. DEXA 2019.  T score -3.4 distal third of radius.  Lowest T score -2.1 on other measurements stable from prior DEXA.  History of low vitamin D and taking vitamin D supplement infrequently, encouraged her to try to be more regular about this.  Check DEXA this year.  She will plan to schedule. 7. Health maintenance.  No labs today as she normally has these completed with her primary care provider.   Return annually or sooner, prn.  Joseph Pierini MD 01/14/20

## 2020-01-30 DIAGNOSIS — L501 Idiopathic urticaria: Secondary | ICD-10-CM | POA: Diagnosis not present

## 2020-02-02 ENCOUNTER — Other Ambulatory Visit: Payer: Self-pay

## 2020-02-02 ENCOUNTER — Ambulatory Visit (INDEPENDENT_AMBULATORY_CARE_PROVIDER_SITE_OTHER): Payer: Medicare Other

## 2020-02-02 DIAGNOSIS — L501 Idiopathic urticaria: Secondary | ICD-10-CM

## 2020-02-10 DIAGNOSIS — I110 Hypertensive heart disease with heart failure: Secondary | ICD-10-CM | POA: Diagnosis not present

## 2020-02-10 DIAGNOSIS — B351 Tinea unguium: Secondary | ICD-10-CM | POA: Diagnosis not present

## 2020-02-10 DIAGNOSIS — E6609 Other obesity due to excess calories: Secondary | ICD-10-CM | POA: Diagnosis not present

## 2020-02-10 DIAGNOSIS — M130 Polyarthritis, unspecified: Secondary | ICD-10-CM | POA: Diagnosis not present

## 2020-02-10 DIAGNOSIS — Z6841 Body Mass Index (BMI) 40.0 and over, adult: Secondary | ICD-10-CM | POA: Diagnosis not present

## 2020-02-10 DIAGNOSIS — L638 Other alopecia areata: Secondary | ICD-10-CM | POA: Diagnosis not present

## 2020-02-10 DIAGNOSIS — I10 Essential (primary) hypertension: Secondary | ICD-10-CM | POA: Diagnosis not present

## 2020-02-24 DIAGNOSIS — R351 Nocturia: Secondary | ICD-10-CM | POA: Diagnosis not present

## 2020-02-24 DIAGNOSIS — E669 Obesity, unspecified: Secondary | ICD-10-CM | POA: Diagnosis not present

## 2020-02-24 DIAGNOSIS — I10 Essential (primary) hypertension: Secondary | ICD-10-CM | POA: Diagnosis not present

## 2020-02-24 DIAGNOSIS — I5030 Unspecified diastolic (congestive) heart failure: Secondary | ICD-10-CM | POA: Diagnosis not present

## 2020-02-26 ENCOUNTER — Other Ambulatory Visit: Payer: Self-pay

## 2020-02-26 ENCOUNTER — Encounter: Payer: Self-pay | Admitting: Cardiology

## 2020-02-26 ENCOUNTER — Ambulatory Visit: Payer: Medicare Other | Admitting: Cardiology

## 2020-02-26 ENCOUNTER — Ambulatory Visit: Payer: Federal, State, Local not specified - PPO | Admitting: Cardiology

## 2020-02-26 VITALS — BP 110/71 | HR 75 | Ht 62.0 in | Wt 262.0 lb

## 2020-02-26 DIAGNOSIS — Z6841 Body Mass Index (BMI) 40.0 and over, adult: Secondary | ICD-10-CM | POA: Diagnosis not present

## 2020-02-26 DIAGNOSIS — I5032 Chronic diastolic (congestive) heart failure: Secondary | ICD-10-CM

## 2020-02-26 DIAGNOSIS — I10 Essential (primary) hypertension: Secondary | ICD-10-CM

## 2020-02-26 DIAGNOSIS — Z87891 Personal history of nicotine dependence: Secondary | ICD-10-CM | POA: Diagnosis not present

## 2020-02-26 MED ORDER — METOPROLOL SUCCINATE ER 25 MG PO TB24: 25.0000 mg | ORAL_TABLET | Freq: Every day | ORAL | 0 refills | Status: DC

## 2020-02-26 NOTE — Progress Notes (Signed)
Kimberly Crosby Date of Birth: [birth year] MRN: [id number] Primary Care Provider:Bland, Myra Rude, MD Former Cardiology Providers: Dr. Adrian Prows, Jeri Lager, APRN, FNP-C Primary Cardiologist: Rex Kras, DO, [facility] (established care 02/26/2020)  Date: 02/26/20 Last Visit: 02/26/2019  Chief Complaint  Patient presents with  . Congestive Heart Failure    HPI  Kimberly Crosby is a 79 y.o.  female who presents to the office with a chief complaint of " heart failure management." Patient's past medical history and cardiovascular risk factors include: Hypertension, obesity, chronic diastolic heart failure, Former smoker, advanced age, postmenopausal female.  Patient was formally under the care of [facility] for the management of heart failure preserved EF.  Patient is here for her yearly follow-up.  Heart failure with preserved EF: Patient states that she is doing well overall.  No recent hospitalizations or urgent care visits for congestive heart failure.  Patient states that she recently started to walk 3 miles 3 days a week as she was prior to the COVID-19 pandemic last year.  Recently she started noticing effort related dyspnea and the need to take frequent breaks to help alleviate her symptoms.  She does not have any exertional chest pain.  The shortness of breath usually resolves within 5 minutes of resting.  She does not have dyspnea on exertion with her daily activities its only present with overexertion.  Medications reconciled.  Patient states that Delene Loll was started by her PCP and is currently on 97/103 mg but she takes it once a day.  I educated her that this is supposed to be her twice daily dosing and to be confirmed the directions with her PCP.   Patient denies orthopnea, paroxysmal nocturnal dyspnea or lower extremity swelling.  She has lost 4 pounds since last office visit.  She recently had blood work with her PCP 2 weeks ago I do not have a copy of that to review with her during  today's encounter but will request records.  FUNCTIONAL STATUS: She walks 3 days a week each time walking about 3 mile.    ALLERGIES: No Known Allergies   MEDICATION LIST PRIOR TO VISIT: Current Outpatient Medications on File Prior to Visit  Medication Sig Dispense Refill  . fexofenadine (ALLEGRA) 180 MG tablet Take 1 tablet (180 mg total) by mouth daily. 30 tablet 5  . latanoprost (XALATAN) 0.005 % ophthalmic solution at bedtime.     Marland Kitchen omalizumab Arvid Right) 150 MG/ML prefilled syringe Inject 150 mg into the skin every 28 (twenty-eight) days.    . Omega-3 Fatty Acids (FISH OIL PO) Take by mouth daily.     Marland Kitchen Propylene Glycol (SYSTANE BALANCE OP) Apply to eye.    . sacubitril-valsartan (ENTRESTO) 97-103 MG Take 1 tablet by mouth daily.     Marland Kitchen spironolactone-hydrochlorothiazide (ALDACTAZIDE) 25-25 MG tablet TAKE 1 TABLET BY MOUTH DAILY 90 tablet 2  . citalopram (CELEXA) 10 MG tablet Take 10 mg by mouth daily.     Current Facility-Administered Medications on File Prior to Visit  Medication Dose Route Frequency Provider Last Rate Last Admin  . omalizumab Arvid Right) injection 300 mg  300 mg Subcutaneous Q28 days Kennith Gain, MD   300 mg at 02/02/20 1149    PAST MEDICAL HISTORY: Past Medical History:  Diagnosis Date  . Asthma   . Breast cyst   . CHF (congestive heart failure) ([place])   . Edema   . Hypertension   . Osteoporosis 11/2017   T score -3.4 distal third radius  PAST SURGICAL HISTORY: Past Surgical History:  Procedure Laterality Date  . BREAST EXCISIONAL BIOPSY Left   . BREAST SURGERY     biopsy-cyst    FAMILY HISTORY: The patient's family history includes Breast cancer (age of onset: 45) in her mother; Diabetes in her brother; Heart disease (age of onset: 77) in her father.   SOCIAL HISTORY:  The patient  reports that she quit smoking about 26 years ago. Her smoking use included cigarettes. She has a 0.75 pack-year smoking history. She has never used  smokeless tobacco. She reports current alcohol use. She reports that she does not use drugs.  Review of Systems  Constitutional: Positive for malaise/fatigue. Negative for chills and fever.  HENT: Negative for hoarse voice and nosebleeds.        Hard of hearing   Eyes: Negative for discharge, double vision and pain.  Cardiovascular: Positive for dyspnea on exertion. Negative for chest pain, claudication, leg swelling, near-syncope, orthopnea, palpitations, paroxysmal nocturnal dyspnea and syncope.  Respiratory: Negative for hemoptysis and shortness of breath.   Musculoskeletal: Negative for muscle cramps and myalgias.  Gastrointestinal: Negative for abdominal pain, constipation, diarrhea, hematemesis, hematochezia, melena, nausea and vomiting.  Neurological: Negative for dizziness and light-headedness.    PHYSICAL EXAM: Vitals with BMI 02/26/2020 01/14/2020 02/26/2019  Height 5\' 2"  5\' 2"  5\' 2"   Weight 262 lbs 264 lbs 266 lbs 11 oz  BMI 47.91 42.68 34.19  Systolic 622 297 989  Diastolic 71 80 70  Pulse 75 - 91    CONSTITUTIONAL: Well-developed and well-nourished. No acute distress.  SKIN: Skin is warm and dry. No rash noted. No cyanosis. No pallor. No jaundice HEAD: Normocephalic and atraumatic.  EYES: No scleral icterus MOUTH/THROAT: Moist oral membranes.  NECK: No JVD present. No thyromegaly noted. No carotid bruits  LYMPHATIC: No visible cervical adenopathy.  CHEST Normal respiratory effort. No intercostal retractions  LUNGS: Clear to auscultation bilaterally. No stridor. No wheezes. No rales.  CARDIOVASCULAR: Regular rate and rhythm, positive S1-S2, no murmurs rubs or gallops appreciated. ABDOMINAL: Obese, soft, nontender, nondistended, positive bowel sounds all 4 quadrants. No apparent ascites.  EXTREMITIES: No peripheral edema  HEMATOLOGIC: No significant bruising NEUROLOGIC: Oriented to person, place, and time. Nonfocal. Normal muscle tone.  PSYCHIATRIC: Normal mood and  affect. Normal behavior. Cooperative  CARDIAC DATABASE: EKG: 02/26/2020: Sinus  Rhythm, 71bpm, without underlying ischemia or injury pattern.   Echocardiogram: 02/13/2019: Hyperdynamic LV systolic function with visual EF 65-70%. Left ventricle cavity is normal in size. Left ventricle regional wall motion findings: No wall motion abnormalities. Moderate concentric hypertrophy of the left ventricle. Doppler evidence of grade II (pseudonormal) diastolic dysfunction. Diastolic dysfunction findings suggests elevated LA/LV end diastolic pressure. Left atrial cavity is mildly dilated at 4 cm. Mild tricuspid regurgitation. PA systolic pressure upper normal at 30 mm Hg with RA pressure estimated at  3 mm Hg. RVSP measures 30 mmHg. Insignificant pericardial effusion. Compared to the study done on 09/27/2017, no significant change.   Stress Testing:  Lexiscan myoview stress test 07/27/2017: Stress and rest SPECT images demonstrate homogeneous tracer distribution throughout the myocardium. Gated SPECT imaging reveals normal myocardial thickening and wall motion. The left ventricular ejection fraction was normal (69%). Low risk study.  Heart Catheterization: None  LABORATORY DATA: CBC Latest Ref Rng & Units 06/30/2015  WBC 4.0 - 10.5 K/uL 8.8  Hemoglobin 12.0 - 15.0 g/dL 14.4  Hematocrit 36 - 46 % 43.5  Platelets 150 - 400 K/uL 261    CMP Latest Ref  Rng & Units 06/30/2015  Glucose 65 - 99 mg/dL 110(H)  BUN 6 - 20 mg/dL 12  Creatinine 0.44 - 1.00 mg/dL 0.98  Sodium 135 - 145 mmol/L 142  Potassium 3.5 - 5.1 mmol/L 3.8  Chloride 101 - 111 mmol/L 108  CO2 22 - 32 mmol/L 25  Calcium 8.9 - 10.3 mg/dL 10.0  Total Protein 6.5 - 8.1 g/dL 7.8  Total Bilirubin 0.3 - 1.2 mg/dL 0.5  Alkaline Phos 38 - 126 U/L 77  AST 15 - 41 U/L 20  ALT 14 - 54 U/L 15    Lipid Panel  No results found for: CHOL, TRIG, HDL, CHOLHDL, VLDL, LDLCALC, LDLDIRECT, LABVLDL  No results found for: HGBA1C No components  found for: NTPROBNP No results found for: TSH  Cardiac Panel (last 3 results) No results for input(s): CKTOTAL, CKMB, TROPONINIHS, RELINDX in the last 72 hours.  IMPRESSION:    ICD-10-CM   1. Chronic heart failure with preserved ejection fraction (HFpEF) (HCC)  I50.32 EKG 12-Lead    metoprolol succinate (TOPROL XL) 25 MG 24 hr tablet  2. Essential hypertension  I10   3. Former smoker  Z87.891   4. Class 3 severe obesity due to excess calories with serious comorbidity and body mass index (BMI) of 45.0 to 49.9 in adult [facility])  E66.01    Z68.42      RECOMMENDATIONS: Kimberly Crosby is a 79 y.o. female whose past medical history and cardiovascular risk factors include: Hypertension, obesity, chronic diastolic heart failure, Former smoker, advanced age, postmenopausal female.  Chronic heart failure with preserved EF, stage C, NYHA class II:  Medications reconciled.  I have asked her to reach out to her PCP who prescribes her Delene Loll and to verify the dosing interval and directions.  She is currently taking it once a day; however, it should be in twice daily dosing.  Request labs are performed through PCP to use for review.  We will add Toprol-XL 25 mg p.o. daily.  Most recent echocardiogram results reviewed and noted above for further reference.  Patient has lost weight since last office visit and appears euvolemic on examination.  She recently had a stress test in 2019 results noted above.  We will continue current medical therapy for now and reevaluate her dyspnea on exertion in 6 months or sooner if needed.  Dyspnea on exertion: See above  Benign essential hypertension:  Office blood pressure is very well controlled.  Medications reconciled.  Low-salt diet recommended.  Strict I's and O's and daily weights.  Former smoker: Educated on the importance of continued smoking cessation.  FINAL MEDICATION LIST END OF ENCOUNTER: Meds ordered this encounter  Medications  .  metoprolol succinate (TOPROL XL) 25 MG 24 hr tablet    Sig: Take 1 tablet (25 mg total) by mouth daily.    Dispense:  90 tablet    Refill:  0     Current Outpatient Medications:  .  fexofenadine (ALLEGRA) 180 MG tablet, Take 1 tablet (180 mg total) by mouth daily., Disp: 30 tablet, Rfl: 5 .  latanoprost (XALATAN) 0.005 % ophthalmic solution, at bedtime. , Disp: , Rfl:  .  omalizumab (XOLAIR) 150 MG/ML prefilled syringe, Inject 150 mg into the skin every 28 (twenty-eight) days., Disp: , Rfl:  .  Omega-3 Fatty Acids (FISH OIL PO), Take by mouth daily. , Disp: , Rfl:  .  Propylene Glycol (SYSTANE BALANCE OP), Apply to eye., Disp: , Rfl:  .  sacubitril-valsartan (ENTRESTO) 97-103 MG, Take  1 tablet by mouth daily. , Disp: , Rfl:  .  spironolactone-hydrochlorothiazide (ALDACTAZIDE) 25-25 MG tablet, TAKE 1 TABLET BY MOUTH DAILY, Disp: 90 tablet, Rfl: 2 .  citalopram (CELEXA) 10 MG tablet, Take 10 mg by mouth daily., Disp: , Rfl:  .  metoprolol succinate (TOPROL XL) 25 MG 24 hr tablet, Take 1 tablet (25 mg total) by mouth daily., Disp: 90 tablet, Rfl: 0  Current Facility-Administered Medications:  .  omalizumab Arvid Right) injection 300 mg, 300 mg, Subcutaneous, Q28 days, Kennith Gain, MD, 300 mg at 02/02/20 1149  Orders Placed This Encounter  Procedures  . EKG 12-Lead   --Continue cardiac medications as reconciled in final medication list. --Return in about 6 months (around 08/26/2020) for heart failure management.. Or sooner if needed. --Continue follow-up with your primary care physician regarding the management of your other chronic comorbid conditions.  Patient's questions and concerns were addressed to her satisfaction. She voices understanding of the instructions provided during this encounter.   This note was created using a voice recognition software as a result there may be grammatical errors inadvertently enclosed that do not reflect the nature of this encounter. Every  attempt is made to correct such errors.  Rex Kras, [place], [facility]  Pager: [phone] Office: [phone]

## 2020-02-27 DIAGNOSIS — L501 Idiopathic urticaria: Secondary | ICD-10-CM | POA: Diagnosis not present

## 2020-03-01 ENCOUNTER — Other Ambulatory Visit: Payer: Self-pay

## 2020-03-01 ENCOUNTER — Ambulatory Visit (INDEPENDENT_AMBULATORY_CARE_PROVIDER_SITE_OTHER): Payer: Medicare Other

## 2020-03-01 DIAGNOSIS — L501 Idiopathic urticaria: Secondary | ICD-10-CM | POA: Diagnosis not present

## 2020-03-16 ENCOUNTER — Other Ambulatory Visit: Payer: Self-pay | Admitting: Obstetrics and Gynecology

## 2020-03-16 ENCOUNTER — Other Ambulatory Visit: Payer: Self-pay

## 2020-03-16 ENCOUNTER — Ambulatory Visit (INDEPENDENT_AMBULATORY_CARE_PROVIDER_SITE_OTHER): Payer: Medicare Other

## 2020-03-16 DIAGNOSIS — M810 Age-related osteoporosis without current pathological fracture: Secondary | ICD-10-CM | POA: Diagnosis not present

## 2020-03-16 DIAGNOSIS — Z780 Asymptomatic menopausal state: Secondary | ICD-10-CM | POA: Diagnosis not present

## 2020-03-16 DIAGNOSIS — Z01419 Encounter for gynecological examination (general) (routine) without abnormal findings: Secondary | ICD-10-CM

## 2020-03-17 DIAGNOSIS — I509 Heart failure, unspecified: Secondary | ICD-10-CM | POA: Diagnosis not present

## 2020-03-17 DIAGNOSIS — I10 Essential (primary) hypertension: Secondary | ICD-10-CM | POA: Diagnosis not present

## 2020-03-17 DIAGNOSIS — M130 Polyarthritis, unspecified: Secondary | ICD-10-CM | POA: Diagnosis not present

## 2020-03-28 DIAGNOSIS — L501 Idiopathic urticaria: Secondary | ICD-10-CM | POA: Diagnosis not present

## 2020-03-29 ENCOUNTER — Ambulatory Visit (INDEPENDENT_AMBULATORY_CARE_PROVIDER_SITE_OTHER): Payer: Medicare Other

## 2020-03-29 ENCOUNTER — Other Ambulatory Visit: Payer: Self-pay

## 2020-03-29 DIAGNOSIS — L501 Idiopathic urticaria: Secondary | ICD-10-CM | POA: Diagnosis not present

## 2020-04-12 ENCOUNTER — Ambulatory Visit: Payer: Medicare Other | Attending: Internal Medicine

## 2020-04-12 DIAGNOSIS — Z23 Encounter for immunization: Secondary | ICD-10-CM

## 2020-04-12 NOTE — Progress Notes (Signed)
   Covid-19 Vaccination Clinic  Name:  Kimberly Crosby    MRN: [id number] DOB: 05-30-1940  04/12/2020  Ms. [name] was observed post Covid-19 immunization for 15 minutes without incident. She was provided with Vaccine Information Sheet and instruction to access the V-Safe system.   Ms. [name] was instructed to call 911 with any severe reactions post vaccine: Marland Kitchen Difficulty breathing  . Swelling of face and throat  . A fast heartbeat  . A bad rash all over body  . Dizziness and weakness   Immunizations Administered    Name Date Dose VIS Date Route   Pfizer COVID-19 Vaccine 04/12/2020  1:33 PM 0.3 mL 02/25/2020 Intramuscular   Manufacturer: [place]   Lot: [id number]   [place]: [id number]

## 2020-04-19 DIAGNOSIS — M130 Polyarthritis, unspecified: Secondary | ICD-10-CM | POA: Diagnosis not present

## 2020-04-19 DIAGNOSIS — E669 Obesity, unspecified: Secondary | ICD-10-CM | POA: Diagnosis not present

## 2020-04-19 DIAGNOSIS — I110 Hypertensive heart disease with heart failure: Secondary | ICD-10-CM | POA: Diagnosis not present

## 2020-04-25 DIAGNOSIS — L501 Idiopathic urticaria: Secondary | ICD-10-CM | POA: Diagnosis not present

## 2020-04-26 ENCOUNTER — Ambulatory Visit (INDEPENDENT_AMBULATORY_CARE_PROVIDER_SITE_OTHER): Payer: Medicare Other

## 2020-04-26 ENCOUNTER — Other Ambulatory Visit: Payer: Self-pay

## 2020-04-26 DIAGNOSIS — L501 Idiopathic urticaria: Secondary | ICD-10-CM | POA: Diagnosis not present

## 2020-05-24 ENCOUNTER — Ambulatory Visit: Payer: Self-pay

## 2020-05-27 DIAGNOSIS — L501 Idiopathic urticaria: Secondary | ICD-10-CM

## 2020-05-28 ENCOUNTER — Other Ambulatory Visit: Payer: Self-pay

## 2020-05-28 ENCOUNTER — Ambulatory Visit (INDEPENDENT_AMBULATORY_CARE_PROVIDER_SITE_OTHER): Payer: Medicare Other | Admitting: *Deleted

## 2020-05-28 DIAGNOSIS — L501 Idiopathic urticaria: Secondary | ICD-10-CM

## 2020-05-29 IMAGING — MG DIGITAL SCREENING BILATERAL MAMMOGRAM WITH TOMO AND CAD
6 of 12 series · 6 of 36 positions shown · non-contrast
Comparison: Previous exam(s).

ACR Breast Density Category a: The breast tissue is almost entirely
fatty.

CLINICAL DATA: Screening.

EXAM:
DIGITAL SCREENING BILATERAL MAMMOGRAM WITH TOMO AND CAD

[L MLO synth-2D (1 of 2)]
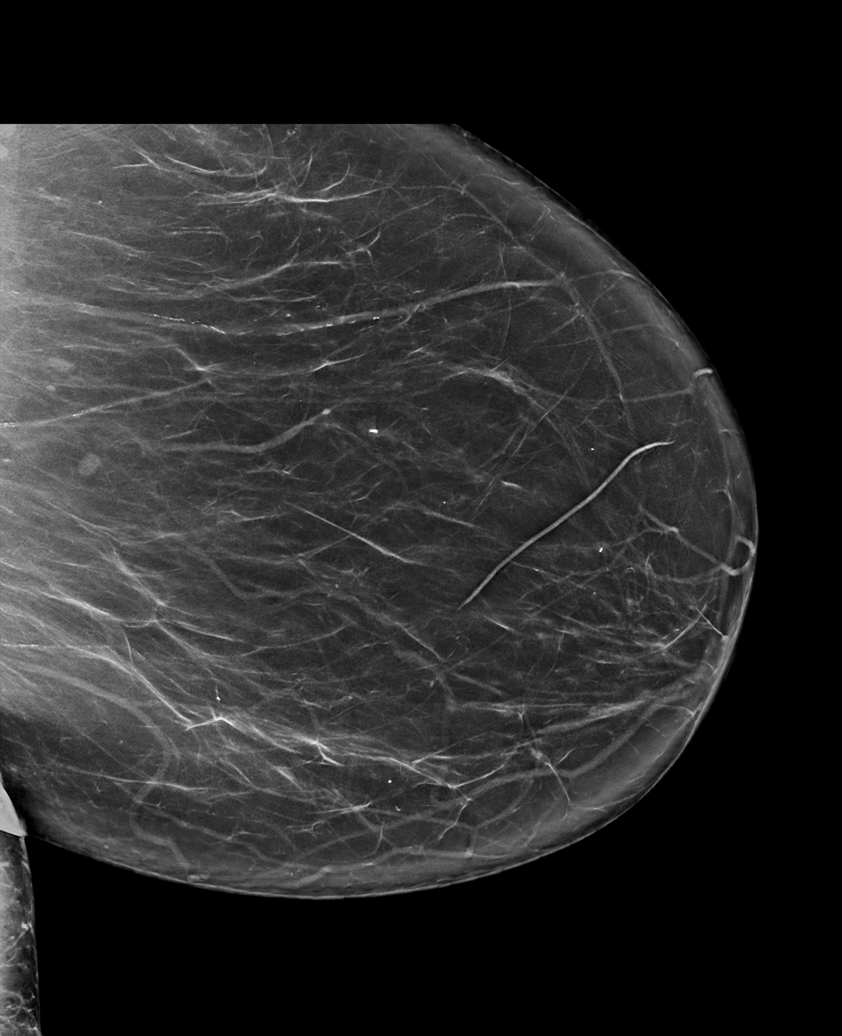

[R CC synth-2D (1 of 2)]
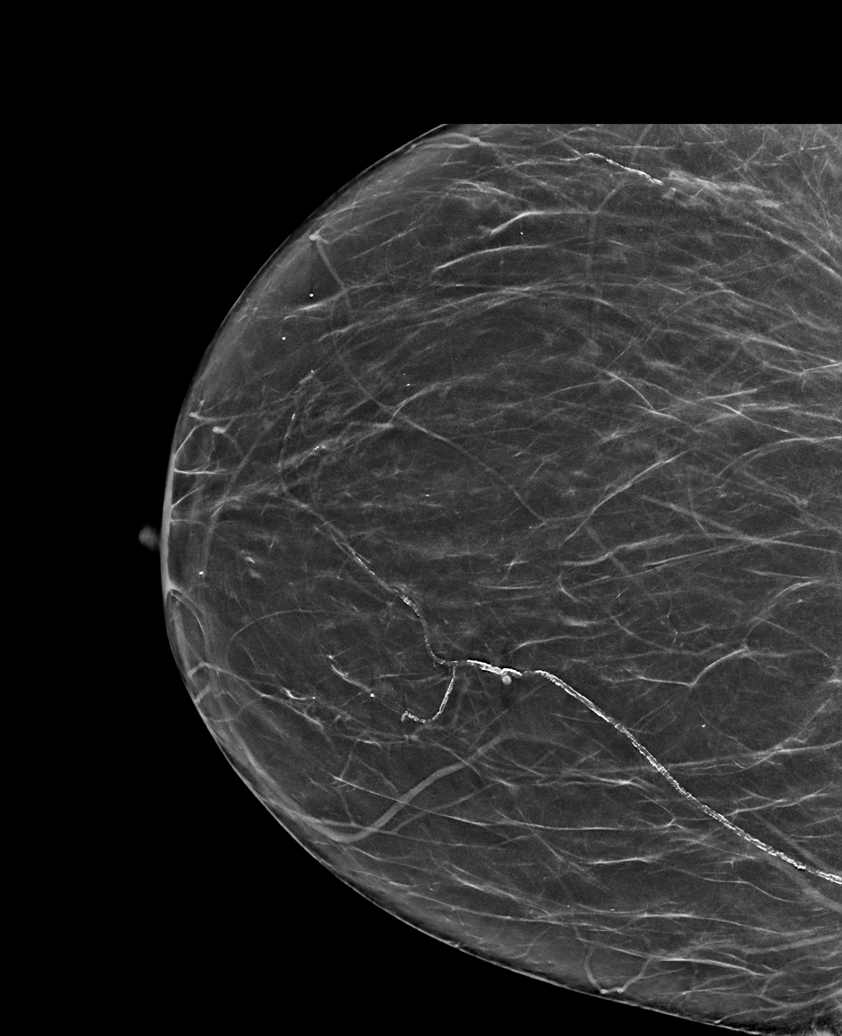

[L CC synth-2D]
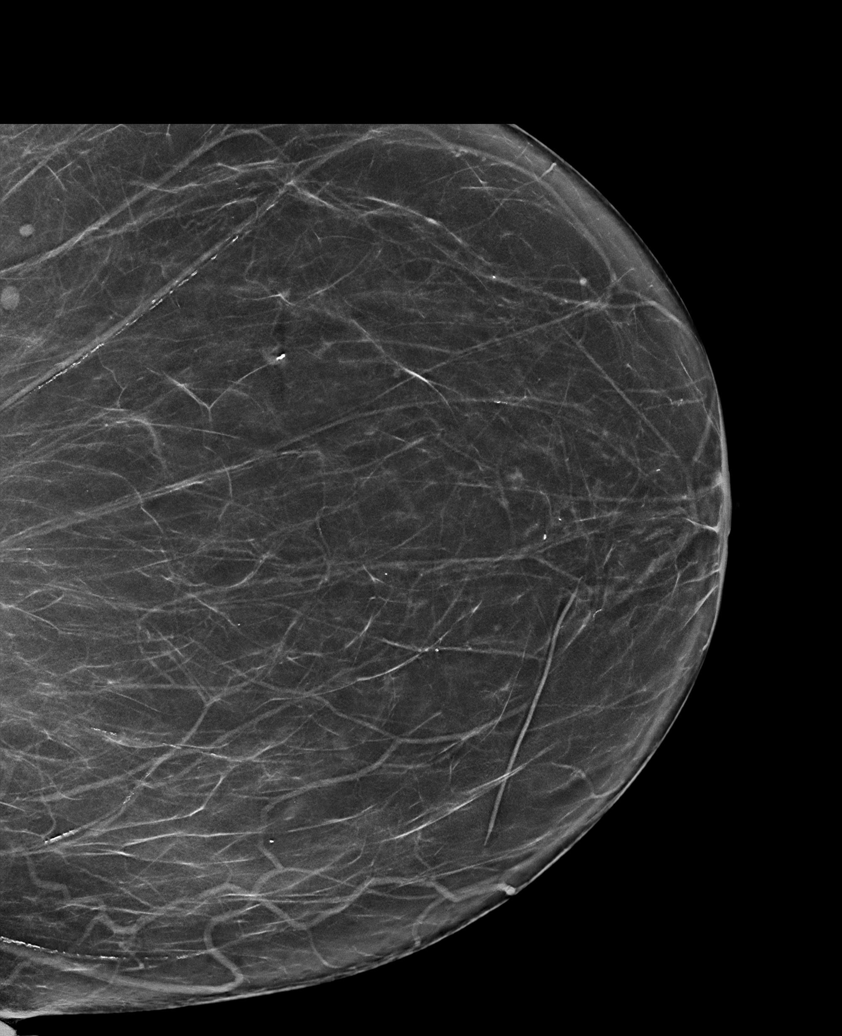

[R CC synth-2D (2 of 2)]
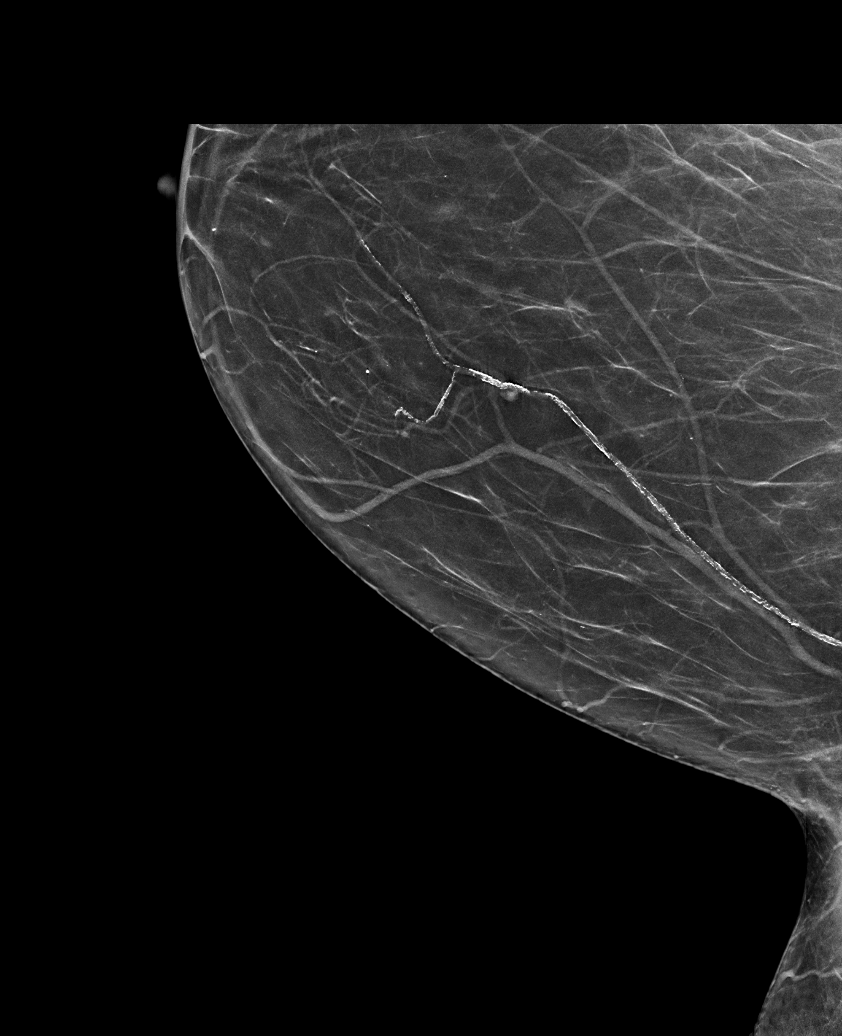

[L MLO synth-2D (2 of 2)]
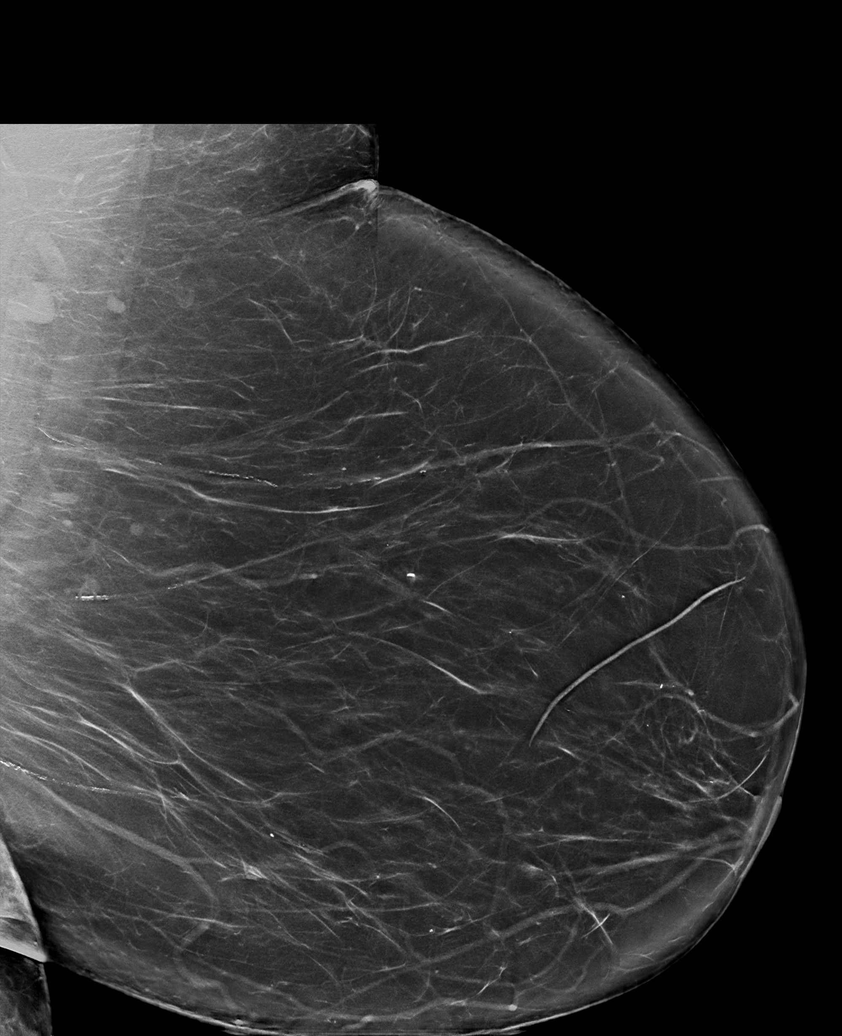

[R MLO synth-2D]
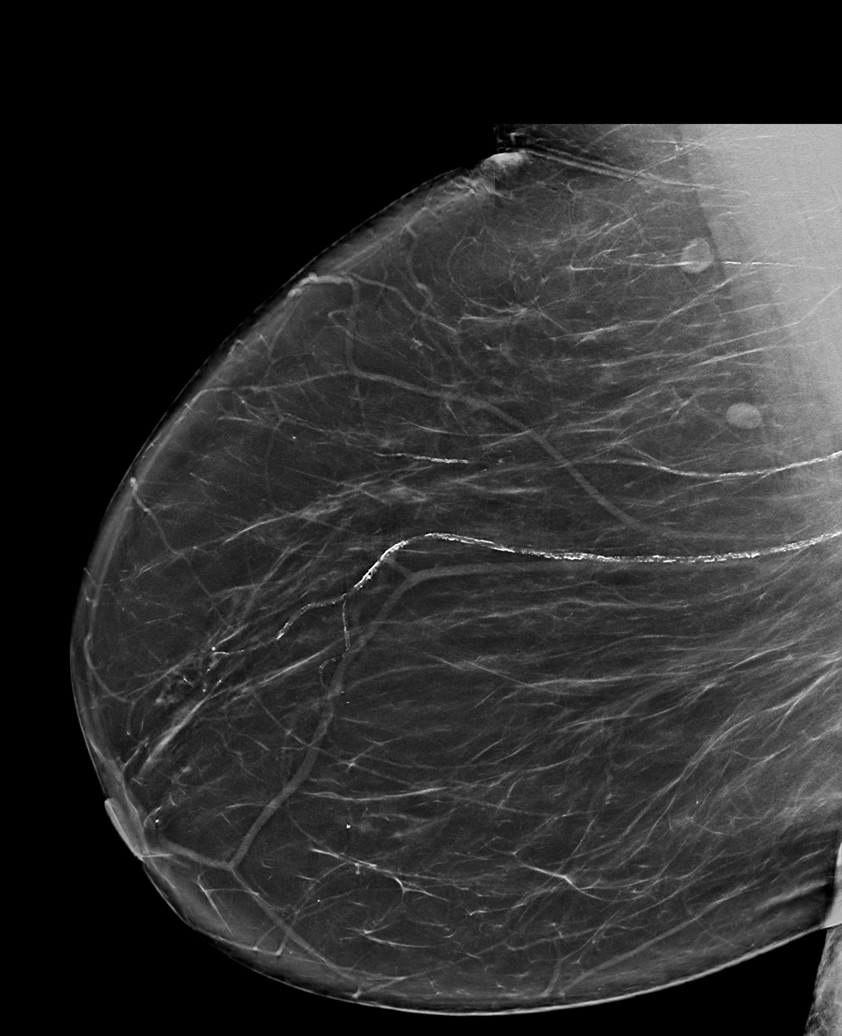

[6 of 36 positions shown; findings below may reference images not displayed]

FINDINGS: There are no findings suspicious for malignancy. Images were
processed with CAD.
IMPRESSION: No mammographic evidence of malignancy. A result letter of this
screening mammogram will be mailed directly to the patient.

RECOMMENDATION:
Screening mammogram in one year. (Code:[id number])

BI-RADS CATEGORY  1: Negative.

## 2020-06-01 DIAGNOSIS — R21 Rash and other nonspecific skin eruption: Secondary | ICD-10-CM | POA: Diagnosis not present

## 2020-06-01 DIAGNOSIS — D225 Melanocytic nevi of trunk: Secondary | ICD-10-CM | POA: Diagnosis not present

## 2020-06-01 DIAGNOSIS — L821 Other seborrheic keratosis: Secondary | ICD-10-CM | POA: Diagnosis not present

## 2020-06-01 DIAGNOSIS — L280 Lichen simplex chronicus: Secondary | ICD-10-CM | POA: Diagnosis not present

## 2020-06-01 DIAGNOSIS — D485 Neoplasm of uncertain behavior of skin: Secondary | ICD-10-CM | POA: Diagnosis not present

## 2020-06-04 DIAGNOSIS — L308 Other specified dermatitis: Secondary | ICD-10-CM | POA: Diagnosis not present

## 2020-06-10 ENCOUNTER — Telehealth: Payer: Self-pay

## 2020-06-10 NOTE — Telephone Encounter (Signed)
[facility] dermatology called regarding mutual patient she had a biopsy done in office biopsy came back positive for psoriasis and they want patient to try a trial off her metoprolol please advise   Office: [facility] [phone]

## 2020-06-15 ENCOUNTER — Telehealth: Payer: Self-pay

## 2020-06-15 DIAGNOSIS — L400 Psoriasis vulgaris: Secondary | ICD-10-CM | POA: Diagnosis not present

## 2020-06-15 NOTE — Telephone Encounter (Signed)
We addressed this today right.

## 2020-06-15 NOTE — Telephone Encounter (Signed)
Yes. I spoke with MA at [facility].

## 2020-06-24 DIAGNOSIS — L501 Idiopathic urticaria: Secondary | ICD-10-CM | POA: Diagnosis not present

## 2020-06-25 ENCOUNTER — Other Ambulatory Visit: Payer: Self-pay

## 2020-06-25 ENCOUNTER — Ambulatory Visit (INDEPENDENT_AMBULATORY_CARE_PROVIDER_SITE_OTHER): Payer: Medicare Other | Admitting: *Deleted

## 2020-06-25 DIAGNOSIS — L501 Idiopathic urticaria: Secondary | ICD-10-CM

## 2020-06-25 NOTE — Telephone Encounter (Signed)
error 

## 2020-07-22 DIAGNOSIS — L501 Idiopathic urticaria: Secondary | ICD-10-CM | POA: Diagnosis not present

## 2020-07-23 ENCOUNTER — Ambulatory Visit (INDEPENDENT_AMBULATORY_CARE_PROVIDER_SITE_OTHER): Payer: Medicare Other | Admitting: *Deleted

## 2020-07-23 ENCOUNTER — Other Ambulatory Visit: Payer: Self-pay

## 2020-07-23 DIAGNOSIS — L501 Idiopathic urticaria: Secondary | ICD-10-CM

## 2020-07-30 DIAGNOSIS — L905 Scar conditions and fibrosis of skin: Secondary | ICD-10-CM | POA: Diagnosis not present

## 2020-07-30 DIAGNOSIS — L400 Psoriasis vulgaris: Secondary | ICD-10-CM | POA: Diagnosis not present

## 2020-07-31 ENCOUNTER — Other Ambulatory Visit: Payer: Self-pay | Admitting: Cardiology

## 2020-08-18 DIAGNOSIS — L501 Idiopathic urticaria: Secondary | ICD-10-CM | POA: Diagnosis not present

## 2020-08-19 ENCOUNTER — Ambulatory Visit (INDEPENDENT_AMBULATORY_CARE_PROVIDER_SITE_OTHER): Payer: Medicare Other | Admitting: *Deleted

## 2020-08-19 ENCOUNTER — Other Ambulatory Visit: Payer: Self-pay

## 2020-08-19 DIAGNOSIS — L501 Idiopathic urticaria: Secondary | ICD-10-CM | POA: Diagnosis not present

## 2020-08-26 ENCOUNTER — Other Ambulatory Visit: Payer: Self-pay

## 2020-08-26 ENCOUNTER — Encounter: Payer: Self-pay | Admitting: Cardiology

## 2020-08-26 ENCOUNTER — Ambulatory Visit: Payer: Medicare Other | Admitting: Cardiology

## 2020-08-26 VITALS — BP 129/63 | HR 88 | Temp 97.3°F | Resp 16 | Ht 62.0 in | Wt 256.2 lb

## 2020-08-26 DIAGNOSIS — I10 Essential (primary) hypertension: Secondary | ICD-10-CM

## 2020-08-26 DIAGNOSIS — R0600 Dyspnea, unspecified: Secondary | ICD-10-CM

## 2020-08-26 DIAGNOSIS — Z87891 Personal history of nicotine dependence: Secondary | ICD-10-CM

## 2020-08-26 DIAGNOSIS — R0609 Other forms of dyspnea: Secondary | ICD-10-CM | POA: Diagnosis not present

## 2020-08-26 DIAGNOSIS — I5032 Chronic diastolic (congestive) heart failure: Secondary | ICD-10-CM

## 2020-08-26 DIAGNOSIS — Z6841 Body Mass Index (BMI) 40.0 and over, adult: Secondary | ICD-10-CM | POA: Diagnosis not present

## 2020-08-26 NOTE — Progress Notes (Signed)
Kimberly Crosby Date of Birth: 05-04-1941 MRN: [id number] Primary Care Provider:Bland, Myra Rude, MD Former Cardiology Providers: Dr. Adrian Prows, Jeri Lager, APRN, FNP-C Primary Cardiologist: Rex Kras, DO, [facility] (established care 02/26/2020)  Date: 08/26/20 Last Visit: 02/26/2019  Chief Complaint  Patient presents with  . Chronic heart failure with preserved ejection fraction   . Follow-up    HPI  Kimberly Crosby is a 80 y.o.  female who presents to the office with a chief complaint of " 90-month follow-up appointment for congestive heart failure." Patient's past medical history and cardiovascular risk factors include: Hypertension, obesity, chronic diastolic heart failure, Former smoker, advanced age, postmenopausal female.  Patient was formally under the care of [facility] for the management of heart failure preserved EF.  Patient is here for her yearly follow-up.  Heart failure with preserved EF: Patient states that she is doing well overall.  No recent hospitalizations or urgent care visits for congestive heart failure.  Patient denies lower extremity swelling, orthopnea, paroxysmal nocturnal dyspnea.  However, since last office visit clinically patient appears to be quite dyspneic with minimal conversation.  In addition, she is short of breath with doing basic house chores and is no longer able to walk 3 miles 3 times a week due to effort related dyspnea.  Patient denies any recent surgery or prolonged immobilization.  Review of systems are positive for taking frequent breaks, being tired and fatigued, shortness of breath, and weight loss.  With regards to uptitrating guideline directed medical therapy she was started on Toprol-XL at the last office visit but she stopped using it as she thought it caused her hives but later found out that that was not the cause of her hives after seeing a dermatologist.  FUNCTIONAL STATUS: She walks 3 days a week each time walking about 3 mile.     ALLERGIES: No Known Allergies   MEDICATION LIST PRIOR TO VISIT: Current Outpatient Medications on File Prior to Visit  Medication Sig Dispense Refill  . betamethasone dipropionate (DIPROLENE) 0.05 % ointment Apply topically.    . citalopram (CELEXA) 10 MG tablet Take 10 mg by mouth daily.    . fexofenadine (ALLEGRA) 180 MG tablet Take 1 tablet (180 mg total) by mouth daily. 30 tablet 5  . Fluocinolone Acetonide Scalp 0.01 % OIL APPLY TOPICALLY TO THE SCALP EVERY NIGHT    . latanoprost (XALATAN) 0.005 % ophthalmic solution at bedtime.     Marland Kitchen omalizumab Arvid Right) 150 MG/ML prefilled syringe Inject 150 mg into the skin every 28 (twenty-eight) days.    . Omega-3 Fatty Acids (FISH OIL PO) Take by mouth daily.     Marland Kitchen Propylene Glycol (SYSTANE BALANCE OP) Apply to eye.    . sacubitril-valsartan (ENTRESTO) 97-103 MG Take 1 tablet by mouth daily.     Marland Kitchen spironolactone-hydrochlorothiazide (ALDACTAZIDE) 25-25 MG tablet TAKE 1 TABLET BY MOUTH DAILY 90 tablet 2  . triamcinolone (KENALOG) 0.025 % ointment APPLY A SMALL AMOUNT TOPICALLY TO RASH ON SKIN EVERY DAY UNTIL RESOLVED     Current Facility-Administered Medications on File Prior to Visit  Medication Dose Route Frequency Provider Last Rate Last Admin  . omalizumab Arvid Right) injection 300 mg  300 mg Subcutaneous Q28 days Kennith Gain, MD   300 mg at 08/19/20 1114    PAST MEDICAL HISTORY: Past Medical History:  Diagnosis Date  . Asthma   . Breast cyst   . CHF (congestive heart failure) ([place])   . Edema   . Hypertension   .  Osteoporosis 11/2017   T score -3.4 distal third radius    PAST SURGICAL HISTORY: Past Surgical History:  Procedure Laterality Date  . BREAST EXCISIONAL BIOPSY Left   . BREAST SURGERY     biopsy-cyst    FAMILY HISTORY: The patient's family history includes Breast cancer (age of onset: 2) in her mother; Diabetes in her brother; Heart disease (age of onset: 76) in her father.   SOCIAL HISTORY:  The  patient  reports that she quit smoking about 26 years ago. Her smoking use included cigarettes. She has a 0.75 pack-year smoking history. She has never used smokeless tobacco. She reports current alcohol use. She reports that she does not use drugs.  Review of Systems  Constitutional: Positive for malaise/fatigue and weight loss. Negative for chills and fever.  HENT: Negative for hoarse voice and nosebleeds.        Hard of hearing   Eyes: Negative for discharge, double vision and pain.  Cardiovascular: Positive for dyspnea on exertion. Negative for chest pain, claudication, leg swelling, near-syncope, orthopnea, palpitations, paroxysmal nocturnal dyspnea and syncope.  Respiratory: Positive for shortness of breath. Negative for hemoptysis.   Musculoskeletal: Negative for muscle cramps and myalgias.  Gastrointestinal: Negative for abdominal pain, constipation, diarrhea, hematemesis, hematochezia, melena, nausea and vomiting.  Neurological: Negative for dizziness and light-headedness.    PHYSICAL EXAM: Vitals with BMI 08/26/2020 02/26/2020 01/14/2020  Height 5\' 2"  5\' 2"  5\' 2"   Weight 256 lbs 3 oz 262 lbs 264 lbs  BMI 46.85 41.96 22.29  Systolic 798 921 194  Diastolic 63 71 80  Pulse 88 75 -    CONSTITUTIONAL: Well-developed and well-nourished. No acute distress.  SKIN: Skin is warm and dry. No rash noted. No cyanosis. No pallor. No jaundice HEAD: Normocephalic and atraumatic.  EYES: No scleral icterus MOUTH/THROAT: Moist oral membranes.  NECK: No JVD present. No thyromegaly noted. No carotid bruits  LYMPHATIC: No visible cervical adenopathy.  CHEST Normal respiratory effort. No intercostal retractions  LUNGS: Clear to auscultation bilaterally. No stridor. No wheezes. No rales.  CARDIOVASCULAR: Regular rate and rhythm, positive S1-S2, no murmurs rubs or gallops appreciated. ABDOMINAL: Obese, soft, nontender, nondistended, positive bowel sounds all 4 quadrants. No apparent ascites.   EXTREMITIES: No peripheral edema  HEMATOLOGIC: No significant bruising NEUROLOGIC: Oriented to person, place, and time. Nonfocal. Normal muscle tone.  PSYCHIATRIC: Normal mood and affect. Normal behavior. Cooperative  CARDIAC DATABASE: EKG: 08/26/2020: Sinus  Rhythm, 80bpm, without underlying ischemia or injury pattern.   Echocardiogram: 02/13/2019: Hyperdynamic LV systolic function with visual EF 65-70%. Left ventricle cavity is normal in size. Left ventricle regional wall motion findings: No wall motion abnormalities. Moderate concentric hypertrophy of the left ventricle. Doppler evidence of grade II (pseudonormal) diastolic dysfunction. Diastolic dysfunction findings suggests elevated LA/LV end diastolic pressure. Left atrial cavity is mildly dilated at 4 cm. Mild tricuspid regurgitation. PA systolic pressure upper normal at 30 mm Hg with RA pressure estimated at  3 mm Hg. RVSP measures 30 mmHg. Insignificant pericardial effusion. Compared to the study done on 09/27/2017, no significant change.   Stress Testing:  Lexiscan myoview stress test 07/27/2017: Stress and rest SPECT images demonstrate homogeneous tracer distribution throughout the myocardium. Gated SPECT imaging reveals normal myocardial thickening and wall motion. The left ventricular ejection fraction was normal (69%). Low risk study.  Heart Catheterization: None  LABORATORY DATA: CBC Latest Ref Rng & Units 06/30/2015  WBC 4.0 - 10.5 K/uL 8.8  Hemoglobin 12.0 - 15.0 g/dL 14.4  Hematocrit 36.0 -  46.0 % 43.5  Platelets 150 - 400 K/uL 261    CMP Latest Ref Rng & Units 08/26/2020 06/30/2015  Glucose 65 - 99 mg/dL 110(H) 110(H)  BUN 8 - 27 mg/dL 16 12  Creatinine 0.57 - 1.00 mg/dL 1.04(H) 0.98  Sodium 134 - 144 mmol/L 141 142  Potassium 3.5 - 5.2 mmol/L 4.3 3.8  Chloride 96 - 106 mmol/L 106 108  CO2 20 - 29 mmol/L 19(L) 25  Calcium 8.7 - 10.3 mg/dL 9.6 10.0  Total Protein 6.5 - 8.1 g/dL - 7.8  Total Bilirubin 0.3 -  1.2 mg/dL - 0.5  Alkaline Phos 38 - 126 U/L - 77  AST 15 - 41 U/L - 20  ALT 14 - 54 U/L - 15    Lipid Panel  No results found for: CHOL, TRIG, HDL, CHOLHDL, VLDL, LDLCALC, LDLDIRECT, LABVLDL  No results found for: HGBA1C No components found for: NTPROBNP No results found for: TSH  Cardiac Panel (last 3 results) No results for input(s): CKTOTAL, CKMB, TROPONINIHS, RELINDX in the last 72 hours.  IMPRESSION:    ICD-10-CM   1. Dyspnea on exertion  Q00.86 Basic metabolic panel    Pro b natriuretic peptide (BNP)    Magnesium    D-dimer, quantitative    PCV ECHOCARDIOGRAM COMPLETE    PCV MYOCARDIAL PERFUSION WITH LEXISCAN  2. Chronic heart failure with preserved ejection fraction (HFpEF) (HCC)  I50.32 EKG 76-[id number]    Basic metabolic panel    Pro b natriuretic peptide (BNP)    Magnesium    D-dimer, quantitative    PCV ECHOCARDIOGRAM COMPLETE    PCV MYOCARDIAL PERFUSION WITH LEXISCAN  3. Former smoker  Z87.891   4. Essential hypertension  I10   5. Class 3 severe obesity due to excess calories with serious comorbidity and body mass index (BMI) of 45.0 to 49.9 in adult [facility])  E66.01    Z68.42      RECOMMENDATIONS: Kimberly Crosby is a 80 y.o. female whose past medical history and cardiovascular risk factors include: Hypertension, obesity, chronic diastolic heart failure, Former smoker, advanced age, postmenopausal female.  Dyspnea on exertion:  Patient is profoundly dyspneic on examination and concern for possible pulmonary embolism versus acute exacerbation of CHF.  Based on clinical findings patient is not volume overloaded, no JVP, weight loss, and compliant with her GDMT.  Therefore acute exacerbation of CHF is low.  We will check an echocardiogram and stress test for restratification due to change in symptoms and her last ischemic evaluation was in 2019.  Check BMP, BNP, D-dimer.  Patient is made aware that if the D-dimers are positive will recommend either VQ scan or CT PE  protocol depending on kidney function.  Patient is instructed to have her labs done today.  Chronic heart failure with preserved EF, stage C, NYHA class II:  Medications reconciled.  Started on Toprol-XL 25 mg p.o. daily, patient stopped secondary to hives.  Later when evaluated by dermatology she was informed that the rash was not medication induced.  Patient is willing to restart Toprol-XL.  However due to her dyspnea will hold off for now.  Benign essential hypertension:  Office blood pressure is well controlled.  Medications reconciled.  Low-salt diet recommended.  Strict I's and O's and daily weights.  Former smoker: Educated on the importance of continued smoking cessation.  FINAL MEDICATION LIST END OF ENCOUNTER: No orders of the defined types were placed in this encounter.    Current Outpatient Medications:  .  betamethasone dipropionate (DIPROLENE) 0.05 % ointment, Apply topically., Disp: , Rfl:  .  citalopram (CELEXA) 10 MG tablet, Take 10 mg by mouth daily., Disp: , Rfl:  .  fexofenadine (ALLEGRA) 180 MG tablet, Take 1 tablet (180 mg total) by mouth daily., Disp: 30 tablet, Rfl: 5 .  Fluocinolone Acetonide Scalp 0.01 % OIL, APPLY TOPICALLY TO THE SCALP EVERY NIGHT, Disp: , Rfl:  .  latanoprost (XALATAN) 0.005 % ophthalmic solution, at bedtime. , Disp: , Rfl:  .  omalizumab (XOLAIR) 150 MG/ML prefilled syringe, Inject 150 mg into the skin every 28 (twenty-eight) days., Disp: , Rfl:  .  Omega-3 Fatty Acids (FISH OIL PO), Take by mouth daily. , Disp: , Rfl:  .  Propylene Glycol (SYSTANE BALANCE OP), Apply to eye., Disp: , Rfl:  .  sacubitril-valsartan (ENTRESTO) 97-103 MG, Take 1 tablet by mouth daily. , Disp: , Rfl:  .  spironolactone-hydrochlorothiazide (ALDACTAZIDE) 25-25 MG tablet, TAKE 1 TABLET BY MOUTH DAILY, Disp: 90 tablet, Rfl: 2 .  triamcinolone (KENALOG) 0.025 % ointment, APPLY A SMALL AMOUNT TOPICALLY TO RASH ON SKIN EVERY DAY UNTIL RESOLVED, Disp: , Rfl:    Current Facility-Administered Medications:  .  omalizumab (XOLAIR) injection 300 mg, 300 mg, Subcutaneous, Q28 days, Kennith Gain, MD, 300 mg at 08/19/20 1114  Orders Placed This Encounter  Procedures  . Basic metabolic panel  . Pro b natriuretic peptide (BNP)  . Magnesium  . D-dimer, quantitative  . PCV MYOCARDIAL PERFUSION WITH LEXISCAN  . EKG 12-Lead  . PCV ECHOCARDIOGRAM COMPLETE   --Continue cardiac medications as reconciled in final medication list. --Return in about 4 weeks (around 09/23/2020) for Reevaluation of, Dyspnea, Review test results. Or sooner if needed. --Continue follow-up with your primary care physician regarding the management of your other chronic comorbid conditions.  Patient's questions and concerns were addressed to her satisfaction. She voices understanding of the instructions provided during this encounter.   This note was created using a voice recognition software as a result there may be grammatical errors inadvertently enclosed that do not reflect the nature of this encounter. Every attempt is made to correct such errors.  Rex Kras, [place], [facility]  Pager: [phone] Office: [phone]

## 2020-08-27 ENCOUNTER — Inpatient Hospital Stay (HOSPITAL_COMMUNITY)
Admission: EM | Admit: 2020-08-27 | Discharge: 2020-08-29 | DRG: 176 | Disposition: A | Discharge status: Home / Self Care | Payer: Medicare Other | Attending: Internal Medicine | Admitting: Internal Medicine

## 2020-08-27 ENCOUNTER — Ambulatory Visit
Admission: RE | Admit: 2020-08-27 | Discharge: 2020-08-27 | Disposition: A | Discharge status: Home / Self Care | Payer: Medicare Other | Source: Ambulatory Visit | Attending: Cardiology | Admitting: Cardiology

## 2020-08-27 ENCOUNTER — Other Ambulatory Visit: Payer: Self-pay

## 2020-08-27 ENCOUNTER — Other Ambulatory Visit: Payer: Self-pay | Admitting: Cardiology

## 2020-08-27 ENCOUNTER — Encounter (HOSPITAL_COMMUNITY): Payer: Self-pay

## 2020-08-27 DIAGNOSIS — I110 Hypertensive heart disease with heart failure: Secondary | ICD-10-CM | POA: Diagnosis not present

## 2020-08-27 DIAGNOSIS — J449 Chronic obstructive pulmonary disease, unspecified: Secondary | ICD-10-CM | POA: Diagnosis present

## 2020-08-27 DIAGNOSIS — I2699 Other pulmonary embolism without acute cor pulmonale: Secondary | ICD-10-CM | POA: Diagnosis not present

## 2020-08-27 DIAGNOSIS — I2694 Multiple subsegmental pulmonary emboli without acute cor pulmonale: Secondary | ICD-10-CM | POA: Diagnosis not present

## 2020-08-27 DIAGNOSIS — R0600 Dyspnea, unspecified: Secondary | ICD-10-CM

## 2020-08-27 DIAGNOSIS — Z6841 Body Mass Index (BMI) 40.0 and over, adult: Secondary | ICD-10-CM

## 2020-08-27 DIAGNOSIS — E872 Acidosis: Secondary | ICD-10-CM | POA: Diagnosis present

## 2020-08-27 DIAGNOSIS — I517 Cardiomegaly: Secondary | ICD-10-CM | POA: Diagnosis not present

## 2020-08-27 DIAGNOSIS — R0609 Other forms of dyspnea: Secondary | ICD-10-CM

## 2020-08-27 DIAGNOSIS — I5032 Chronic diastolic (congestive) heart failure: Secondary | ICD-10-CM | POA: Diagnosis not present

## 2020-08-27 DIAGNOSIS — I700 Atherosclerosis of aorta: Secondary | ICD-10-CM | POA: Diagnosis not present

## 2020-08-27 DIAGNOSIS — E785 Hyperlipidemia, unspecified: Secondary | ICD-10-CM | POA: Diagnosis present

## 2020-08-27 DIAGNOSIS — R7989 Other specified abnormal findings of blood chemistry: Secondary | ICD-10-CM | POA: Diagnosis present

## 2020-08-27 DIAGNOSIS — K449 Diaphragmatic hernia without obstruction or gangrene: Secondary | ICD-10-CM | POA: Diagnosis not present

## 2020-08-27 DIAGNOSIS — Z79899 Other long term (current) drug therapy: Secondary | ICD-10-CM

## 2020-08-27 DIAGNOSIS — Z8249 Family history of ischemic heart disease and other diseases of the circulatory system: Secondary | ICD-10-CM

## 2020-08-27 DIAGNOSIS — M810 Age-related osteoporosis without current pathological fracture: Secondary | ICD-10-CM | POA: Diagnosis present

## 2020-08-27 DIAGNOSIS — Z87891 Personal history of nicotine dependence: Secondary | ICD-10-CM

## 2020-08-27 LAB — BASIC METABOLIC PANEL
BUN/Creatinine Ratio: 15 (ref 12–28)
BUN: 16 mg/dL (ref 8–27)
CO2: 19 mmol/L — ABNORMAL LOW (ref 20–29)
Calcium: 9.6 mg/dL (ref 8.7–10.3)
Chloride: 106 mmol/L (ref 96–106)
Creatinine, Ser: 1.04 mg/dL — ABNORMAL HIGH (ref 0.57–1.00)
Glucose: 110 mg/dL — ABNORMAL HIGH (ref 65–99)
Potassium: 4.3 mmol/L (ref 3.5–5.2)
Sodium: 141 mmol/L (ref 134–144)
eGFR: 55 mL/min/{1.73_m2} — ABNORMAL LOW (ref >59)

## 2020-08-27 LAB — CBC WITH DIFFERENTIAL/PLATELET
Abs Immature Granulocytes: 0.05 10*3/uL (ref 0.00–0.07)
Basophils Absolute: 0.1 10*3/uL (ref 0.0–0.1)
Basophils Relative: 1 %
Eosinophils Absolute: 0.1 10*3/uL (ref 0.0–0.5)
Eosinophils Relative: 1 %
HCT: 40.4 % (ref 36.0–46.0)
Hemoglobin: 13 g/dL (ref 12.0–15.0)
Immature Granulocytes: 1 %
Lymphocytes Relative: 25 %
Lymphs Abs: 2.3 10*3/uL (ref 0.7–4.0)
MCH: 29.6 pg (ref 26.0–34.0)
MCHC: 32.2 g/dL (ref 30.0–36.0)
MCV: 92 fL (ref 80.0–100.0)
Monocytes Absolute: 0.6 10*3/uL (ref 0.1–1.0)
Monocytes Relative: 7 %
Neutro Abs: 6 10*3/uL (ref 1.7–7.7)
Neutrophils Relative %: 65 %
Platelets: 229 10*3/uL (ref 150–400)
RBC: 4.39 MIL/uL (ref 3.87–5.11)
RDW: 14.1 % (ref 11.5–15.5)
WBC: 9.1 10*3/uL (ref 4.0–10.5)
nRBC: 0 % (ref 0.0–0.2)

## 2020-08-27 LAB — PRO B NATRIURETIC PEPTIDE: NT-Pro BNP: 143 pg/mL (ref 0–738)

## 2020-08-27 LAB — TROPONIN I (HIGH SENSITIVITY)
Troponin I (High Sensitivity): 5 ng/L (ref <18)
Troponin I (High Sensitivity): 4 ng/L (ref <18)

## 2020-08-27 LAB — APTT: aPTT: 31 seconds (ref 24–36)

## 2020-08-27 LAB — MAGNESIUM: Magnesium: 1.9 mg/dL (ref 1.6–2.3)

## 2020-08-27 LAB — D-DIMER, QUANTITATIVE: D-DIMER: 4.01 mg/L FEU — ABNORMAL HIGH (ref 0.00–0.49)

## 2020-08-27 MED ORDER — SACUBITRIL-VALSARTAN 97-103 MG PO TABS
1.0000 | ORAL_TABLET | Freq: Every day | ORAL | Status: DC
  Administered 2020-08-28 – 2020-08-29 (×2): 1 via ORAL
  Filled 2020-08-27 (×3): qty 1

## 2020-08-27 MED ORDER — ACETAMINOPHEN 650 MG RE SUPP: 650.0000 mg | Freq: Four times a day (QID) | RECTAL | Status: DC | PRN

## 2020-08-27 MED ORDER — SPIRONOLACTONE-HCTZ 25-25 MG PO TABS
1.0000 | ORAL_TABLET | Freq: Every day | ORAL | Status: DC
  Filled 2020-08-27: qty 1

## 2020-08-27 MED ORDER — HEPARIN BOLUS VIA INFUSION
4500.0000 [IU] | Freq: Once | INTRAVENOUS | Status: AC
  Administered 2020-08-27: 4500 [IU] via INTRAVENOUS
  Filled 2020-08-27: qty 4500

## 2020-08-27 MED ORDER — HEPARIN (PORCINE) 25000 UT/250ML-% IV SOLN
1000.0000 [IU]/h | INTRAVENOUS | Status: DC
  Administered 2020-08-27: 1300 [IU]/h via INTRAVENOUS
  Administered 2020-08-28: 1100 [IU]/h via INTRAVENOUS
  Filled 2020-08-27 (×2): qty 250

## 2020-08-27 MED ORDER — CITALOPRAM HYDROBROMIDE 10 MG PO TABS
10.0000 mg | ORAL_TABLET | Freq: Every day | ORAL | Status: DC
  Administered 2020-08-28 – 2020-08-29 (×2): 10 mg via ORAL
  Filled 2020-08-27 (×3): qty 1

## 2020-08-27 MED ORDER — IOPAMIDOL (ISOVUE-370) INJECTION 76%
75.0000 mL | Freq: Once | INTRAVENOUS | Status: AC | PRN
  Administered 2020-08-27: 75 mL via INTRAVENOUS

## 2020-08-27 MED ORDER — ACETAMINOPHEN 325 MG PO TABS: 650.0000 mg | ORAL_TABLET | Freq: Four times a day (QID) | ORAL | Status: DC | PRN

## 2020-08-27 MED ORDER — HYDROCHLOROTHIAZIDE 25 MG PO TABS
25.0000 mg | ORAL_TABLET | Freq: Every day | ORAL | Status: DC
  Administered 2020-08-27 – 2020-08-29 (×3): 25 mg via ORAL
  Filled 2020-08-27 (×3): qty 1

## 2020-08-27 MED ORDER — LATANOPROST 0.005 % OP SOLN
1.0000 [drp] | Freq: Every day | OPHTHALMIC | Status: DC
  Administered 2020-08-27 – 2020-08-28 (×2): 1 [drp] via OPHTHALMIC
  Filled 2020-08-27: qty 2.5

## 2020-08-27 MED ORDER — SPIRONOLACTONE 25 MG PO TABS
25.0000 mg | ORAL_TABLET | Freq: Every day | ORAL | Status: DC
  Administered 2020-08-27 – 2020-08-29 (×3): 25 mg via ORAL
  Filled 2020-08-27 (×3): qty 1

## 2020-08-27 MED ORDER — LORATADINE 10 MG PO TABS
10.0000 mg | ORAL_TABLET | Freq: Every day | ORAL | Status: DC
  Administered 2020-08-28 – 2020-08-29 (×2): 10 mg via ORAL
  Filled 2020-08-27 (×2): qty 1

## 2020-08-27 MED ORDER — TRIAMCINOLONE ACETONIDE 0.5 % EX CREA
TOPICAL_CREAM | Freq: Two times a day (BID) | CUTANEOUS | Status: DC
  Administered 2020-08-29: 1 via TOPICAL
  Filled 2020-08-27: qty 15

## 2020-08-27 NOTE — Progress Notes (Signed)
Patient has been made aware of CT scan and results she is to go today for her scan to [facility]

## 2020-08-27 NOTE — ED Provider Notes (Signed)
[facility] EMERGENCY DEPARTMENT Provider Note   CSN: [id number] Arrival date & time: 08/27/20  1332     History Chief Complaint  Patient presents with  . Shortness of Breath    Kimberly Crosby is a 80 y.o. female.  Patient is a 80 year old female with a history of CHF, hypertension, asthma who is presenting today from her doctor's office because she was evaluated for having shortness of breath.  She was seen yesterday and had lab work done which showed a normal BNP, BMP but an elevated D-dimer.  They called her this morning and had her go to radiology.  They called her later this afternoon and reported she needed to come to the hospital because she had a blood clots.  Patient reports over the last few weeks she had noticed worsening shortness of breath.  She was unable to even walk to the mailbox without sitting down and catching her breath.  She had noticed some intermittent sharp chest pains but nothing that were constant and she just thought it was related to GERD.  She has not noticed any lower extremity swelling or pain.  She has had no recent surgeries or immobilization.  No prior history of PE or DVT.  She does not take anticoagulation.  Never had any GI bleeding and does not have frequent falls.  The history is provided by the patient and medical records.  Shortness of Breath      Past Medical History:  Diagnosis Date  . Asthma   . Breast cyst   . CHF (congestive heart failure) ([place])   . Edema   . Hypertension   . Osteoporosis 11/2017   T score -3.4 distal third radius    Patient Active Problem List   Diagnosis Date Noted  . Unilateral primary osteoarthritis, right knee 03/13/2016  . Chronic idiopathic urticaria 01/07/2016  . Angioedema 01/07/2016  . Other allergic rhinitis 01/07/2016  . Hypertension   . Breast cyst   . Osteopenia     Past Surgical History:  Procedure Laterality Date  . BREAST EXCISIONAL BIOPSY Left   . BREAST SURGERY      biopsy-cyst     OB History    Gravida  4   Para  3   Term  3   Preterm      AB  1   Living  3     SAB      IAB      Ectopic      Multiple      Live Births              Family History  Problem Relation Age of Onset  . Breast cancer Mother 15  . Heart disease Father 108       MI  . Diabetes Brother     Social History   Tobacco Use  . Smoking status: Former Smoker    Packs/day: 0.25    Years: 3.00    Pack years: 0.75    Types: Cigarettes    Quit date: 11/27/1993    Years since quitting: 26.7  . Smokeless tobacco: Never Used  Vaping Use  . Vaping Use: Never used  Substance Use Topics  . Alcohol use: Yes    Alcohol/week: 0.0 standard drinks    Comment: wine occassionally  . Drug use: No    Home Medications Prior to Admission medications   Medication Sig Start Date End Date Taking? Authorizing Provider  betamethasone dipropionate (DIPROLENE) 0.05 %  ointment Apply topically. 06/19/20   [provider]  citalopram (CELEXA) 10 MG tablet Take 10 mg by mouth daily. 02/22/20   [provider]  fexofenadine (ALLEGRA) 180 MG tablet Take 1 tablet (180 mg total) by mouth daily. 10/19/16   Kennith Gain, MD  Fluocinolone Acetonide Scalp 0.01 % OIL APPLY TOPICALLY TO THE SCALP EVERY NIGHT 06/15/20   [provider]  latanoprost (XALATAN) 0.005 % ophthalmic solution at bedtime.  08/13/16   [provider]  omalizumab Arvid Right) 150 MG/ML prefilled syringe Inject 150 mg into the skin every 28 (twenty-eight) days.    [provider]  Omega-3 Fatty Acids (FISH OIL PO) Take by mouth daily.     [provider]  Propylene Glycol (SYSTANE BALANCE OP) Apply to eye.    [provider]  sacubitril-valsartan (ENTRESTO) 97-103 MG Take 1 tablet by mouth daily.     [provider]  spironolactone-hydrochlorothiazide (ALDACTAZIDE) 25-25 MG tablet TAKE 1 TABLET BY MOUTH DAILY 08/02/20   Tolia, Sunit, DO   triamcinolone (KENALOG) 0.025 % ointment APPLY A SMALL AMOUNT TOPICALLY TO RASH ON SKIN EVERY DAY UNTIL RESOLVED 08/11/20   [provider]    Allergies    Patient has no known allergies.  Review of Systems   Review of Systems  Respiratory: Positive for shortness of breath.   All other systems reviewed and are negative.   Physical Exam Updated Vital Signs BP 98/84 (BP Location: Left Arm)   Pulse 75   Temp 98.3 F (36.8 C) (Oral)   Resp 17   Ht 5\' 3"  (1.6 m)   Wt 116.1 kg   SpO2 100%   BMI 45.35 kg/m   Physical Exam Vitals and nursing note reviewed.  Constitutional:      General: She is not in acute distress.    Appearance: She is well-developed.  HENT:     Head: Normocephalic and atraumatic.  Eyes:     Pupils: Pupils are equal, round, and reactive to light.  Cardiovascular:     Rate and Rhythm: Normal rate and regular rhythm.     Heart sounds: Normal heart sounds. No murmur heard. No friction rub.  Pulmonary:     Effort: Pulmonary effort is normal.     Breath sounds: Normal breath sounds. No wheezing or rales.  Abdominal:     General: Bowel sounds are normal. There is no distension.     Palpations: Abdomen is soft.     Tenderness: There is no abdominal tenderness. There is no guarding or rebound.  Musculoskeletal:        General: No tenderness. Normal range of motion.     Right lower leg: No tenderness. No edema.     Left lower leg: No tenderness. No edema.     Comments: No edema  Skin:    General: Skin is warm and dry.     Findings: No rash.  Neurological:     Mental Status: She is alert and oriented to person, place, and time.     Cranial Nerves: No cranial nerve deficit.  Psychiatric:        Behavior: Behavior normal.      ED Results / Procedures / Treatments   Labs (all labs ordered are listed, but only abnormal results are displayed) Labs Reviewed  CBC WITH DIFFERENTIAL/PLATELET  APTT  TROPONIN I (HIGH SENSITIVITY)     EKG None  Radiology CT ANGIO CHEST PE W OR WO CONTRAST  Result Date: 08/27/2020 CLINICAL DATA:  80 year old female with history of dyspnea on exertion and positive D-dimer. Suspected pulmonary embolism. EXAM: CT ANGIOGRAPHY CHEST WITH CONTRAST TECHNIQUE: Multidetector CT imaging of the chest was performed using the standard protocol during bolus administration of intravenous contrast. Multiplanar CT image reconstructions and MIPs were obtained to evaluate the vascular anatomy. CONTRAST:  31mL ISOVUE-370 IOPAMIDOL (ISOVUE-370) INJECTION 76% COMPARISON:  No priors. FINDINGS: Cardiovascular: Large filling defect within the distal right main pulmonary artery extending in the lobar, segmental and subsegmental sized branches in the right lung, most evident in the right lower lobe. These appear predominantly nonocclusive at this time. Heart size is mildly enlarged. Right ventricular diameter is approximately 31 mm. Left ventricular diameter is approximately 36 mm. RV to LV ratio of 0.86. There is no significant pericardial fluid, thickening or pericardial calcification. Aortic atherosclerosis. No definite coronary artery calcifications. Mediastinum/Nodes: No pathologically enlarged mediastinal or hilar lymph nodes. Please note that accurate exclusion of hilar adenopathy is limited on noncontrast CT scans. Moderate sized hiatal hernia. No axillary lymphadenopathy. Lungs/Pleura: No acute consolidative airspace disease. No pleural effusions. No definite suspicious appearing pulmonary nodules or masses are noted on today's motion limited examination. Upper Abdomen: Aortic atherosclerosis. Musculoskeletal: There are no aggressive appearing lytic or blastic lesions noted in the visualized portions of the skeleton. Review of the MIP images confirms the above findings. IMPRESSION: 1. Large right-sided pulmonary embolism extending from the right main pulmonary artery into lobar, segmental and subsegmental sized branches  in the right lung. 2. Mild cardiomegaly. 3. Aortic atherosclerosis. Critical Value/emergent results were called by telephone at the time of interpretation on 08/27/2020 at 12:25 pm to provider Rex Kras, who verbally acknowledged these results. Aortic Atherosclerosis (ICD10-I70.0). Electronically Signed   By: Vinnie Langton M.D.   On: 08/27/2020 12:25    Procedures Procedures   Medications Ordered in ED Medications - No data to display  ED Course  I have reviewed the triage vital signs and the nursing notes.  Pertinent labs & imaging results that were available during my care of the patient were reviewed by me and considered in my medical decision making (see chart for details).    MDM Rules/Calculators/A&P                          Patient is a pleasant elderly female presenting today with 2 weeks or more of shortness of breath and outpatient CTA that showed a large right pulmonary embolism involving the main pulmonary artery into lobar, segmental and subsegmental branches in the right lung.  Patient is hemodynamically stable on exam.  Blood pressure is 103/53.  Satting 100% on room air.  No prior history of similar.  Patient did have a BMP yesterday that was within normal limits and had a negative BNP.  Troponin sent to evaluate for heart strain and CBC.  Patient given heparin IV and will plan to admit.  MDM Number of Diagnoses or Management Options   Amount and/or Complexity of Data Reviewed Clinical lab tests: ordered and reviewed Tests in the radiology section of CPT: reviewed Independent visualization of images, tracings, or specimens: yes   Final Clinical Impression(s) / ED Diagnoses Final diagnoses:  Other acute pulmonary embolism without acute cor pulmonale ([place])    Rx / DC Orders ED Discharge Orders    None       Blanchie Dessert, MD 08/27/20 1436

## 2020-08-27 NOTE — Progress Notes (Signed)
ANTICOAGULATION CONSULT NOTE - Initial Consult  Pharmacy Consult for heparin Indication: pulmonary embolus  No Known Allergies  Patient Measurements: Height: 5\' 3"  (160 cm) Weight: 116.1 kg (256 lb) IBW/kg (Calculated) : 52.4 Heparin Dosing Weight: 80.7kg  Vital Signs: Temp: 98.3 F (36.8 C) (04/22 1348) Temp Source: Oral (04/22 1348) BP: 98/84 (04/22 1348) Pulse Rate: 75 (04/22 1348)  Labs: Recent Labs    08/26/20 1342  CREATININE 1.04*    Estimated Creatinine Clearance: 53.9 mL/min (A) (by C-G formula based on SCr of 1.04 mg/dL (H)).   Medical History: Past Medical History:  Diagnosis Date  . Asthma   . Breast cyst   . CHF (congestive heart failure) ([place])   . Edema   . Hypertension   . Osteoporosis 11/2017   T score -3.4 distal third radius    Medications:  Infusions:  . heparin      Assessment: 53 yof presented to the ED with a new PE. CBC is pending but has been normal in the past. No AC PTA. No bleeding noted.   Goal of Therapy:  Heparin level 0.3-0.7 units/ml Monitor platelets by anticoagulation protocol: Yes   Plan:  Heparin bolus 4500 units IV x 1 Heparin gtt 1300 units/hr Check an 8 hr heparin level Daily heparin level and CBC  Amylynn Fano, Rande Lawman 08/27/2020,2:21 PM

## 2020-08-27 NOTE — H&P (Signed)
History and Physical    [name] Crosby:[id number] DOB: 1941/02/20 DOA: 08/27/2020  PCP: Lucianne Lei, MD (Confirm with patient/family/NH records and if not entered, this has to be entered at [facility] point of entry) Patient coming from: Home  I have personally briefly reviewed patient's old medical records in [place]  Chief Complaint: SOB  HPI: Kimberly Crosby is a 80 y.o. female with medical history significant of severe asthma, HTN, chronic diastolic CHF, HLD, morbid obesity, presented with worsening of shortness of breath.  Symptoms started 2 weeks ago, exertional and gradually getting worse, baseline used to be able to walk 3 miles without any breathing symptoms however over the last 2 weeks gradually exercise tolerance significant decreased.  Denies any chest pain, no cough no fever chills.  No leg pain or swelling, denied any recent long distance traveling, no change her medication list no family history of blood clotting problem several years.  No history of stroke or GI bleed.  Yesterday she went to see her cardiologist discussed about the breathing symptoms and CT angiogram today showed large right lung PE. ED Course: Tachypneic but no hypoxia, blood pressure stable, heparin drip started in ED.  Review of Systems: As per HPI otherwise 14 point review of systems negative.    Past Medical History:  Diagnosis Date  . Asthma   . Breast cyst   . CHF (congestive heart failure) ([place])   . Edema   . Hypertension   . Osteoporosis 11/2017   T score -3.4 distal third radius    Past Surgical History:  Procedure Laterality Date  . BREAST EXCISIONAL BIOPSY Left   . BREAST SURGERY     biopsy-cyst     reports that she quit smoking about 26 years ago. Her smoking use included cigarettes. She has a 0.75 pack-year smoking history. She has never used smokeless tobacco. She reports current alcohol use. She reports that she does not use drugs.  No Known Allergies  Family History   Problem Relation Age of Onset  . Breast cancer Mother 53  . Heart disease Father 49       MI  . Diabetes Brother      Prior to Admission medications   Medication Sig Start Date End Date Taking? Authorizing Provider  betamethasone dipropionate (DIPROLENE) 0.05 % ointment Apply topically. 06/19/20   [provider]  citalopram (CELEXA) 10 MG tablet Take 10 mg by mouth daily. 02/22/20   [provider]  fexofenadine (ALLEGRA) 180 MG tablet Take 1 tablet (180 mg total) by mouth daily. 10/19/16   Kennith Gain, MD  Fluocinolone Acetonide Scalp 0.01 % OIL APPLY TOPICALLY TO THE SCALP EVERY NIGHT 06/15/20   [provider]  latanoprost (XALATAN) 0.005 % ophthalmic solution at bedtime.  08/13/16   [provider]  omalizumab Arvid Right) 150 MG/ML prefilled syringe Inject 150 mg into the skin every 28 (twenty-eight) days.    [provider]  Omega-3 Fatty Acids (FISH OIL PO) Take by mouth daily.     [provider]  Propylene Glycol (SYSTANE BALANCE OP) Apply to eye.    [provider]  sacubitril-valsartan (ENTRESTO) 97-103 MG Take 1 tablet by mouth daily.     [provider]  spironolactone-hydrochlorothiazide (ALDACTAZIDE) 25-25 MG tablet TAKE 1 TABLET BY MOUTH DAILY 08/02/20   Tolia, Sunit, DO  triamcinolone (KENALOG) 0.025 % ointment APPLY A SMALL AMOUNT TOPICALLY TO RASH ON SKIN EVERY DAY UNTIL RESOLVED 08/11/20   [provider]  Physical Exam: Vitals:   08/27/20 1700 08/27/20 1715 08/27/20 1730 08/27/20 1745  BP: 125/60 122/71 126/64 (!) 152/72  Pulse: 63 71 67 75  Resp: 19 19 16  (!) 26  Temp:      TempSrc:      SpO2: 100% 100% 100% 100%  Weight:      Height:        Constitutional: NAD, calm, comfortable Vitals:   08/27/20 1700 08/27/20 1715 08/27/20 1730 08/27/20 1745  BP: 125/60 122/71 126/64 (!) 152/72  Pulse: 63 71 67 75  Resp: 19 19 16  (!) 26  Temp:      TempSrc:      SpO2: 100% 100%  100% 100%  Weight:      Height:       Eyes: PERRL, lids and conjunctivae normal ENMT: Mucous membranes are moist. Posterior pharynx clear of any exudate or lesions.Normal dentition.  Neck: normal, supple, no masses, no thyromegaly Respiratory: clear to auscultation bilaterally, no wheezing, no crackles. Increasing respiratory effort. No accessory muscle use.  Cardiovascular: Regular rate and rhythm, no murmurs / rubs / gallops. No extremity edema. 2+ pedal pulses. No carotid bruits.  Abdomen: no tenderness, no masses palpated. No hepatosplenomegaly. Bowel sounds positive.  Musculoskeletal: no clubbing / cyanosis. No joint deformity upper and lower extremities. Good ROM, no contractures. Normal muscle tone.  Skin: no rashes, lesions, ulcers. No induration Neurologic: CN 2-12 grossly intact. Sensation intact, DTR normal. Strength 5/5 in all 4.  Psychiatric: Normal judgment and insight. Alert and oriented x 3. Normal mood.     Labs on Admission: I have personally reviewed following labs and imaging studies  CBC: Recent Labs  Lab 08/27/20 1430  WBC 9.1  NEUTROABS 6.0  HGB 13.0  HCT 40.4  MCV 92.0  PLT 676   Basic Metabolic Panel: Recent Labs  Lab 08/26/20 1342  NA 141  K 4.3  CL 106  CO2 19*  GLUCOSE 110*  BUN 16  CREATININE 1.04*  CALCIUM 9.6  MG 1.9   GFR: Estimated Creatinine Clearance: 53.9 mL/min (A) (by C-G formula based on SCr of 1.04 mg/dL (H)). Liver Function Tests: No results for input(s): AST, ALT, ALKPHOS, BILITOT, PROT, ALBUMIN in the last 168 hours. No results for input(s): LIPASE, AMYLASE in the last 168 hours. No results for input(s): AMMONIA in the last 168 hours. Coagulation Profile: No results for input(s): INR, PROTIME in the last 168 hours. Cardiac Enzymes: No results for input(s): CKTOTAL, CKMB, CKMBINDEX, TROPONINI in the last 168 hours. BNP (last 3 results) Recent Labs    08/26/20 1342  PROBNP 143   HbA1C: No results for input(s):  HGBA1C in the last 72 hours. CBG: No results for input(s): GLUCAP in the last 168 hours. Lipid Profile: No results for input(s): CHOL, HDL, LDLCALC, TRIG, CHOLHDL, LDLDIRECT in the last 72 hours. Thyroid Function Tests: No results for input(s): TSH, T4TOTAL, FREET4, T3FREE, THYROIDAB in the last 72 hours. Anemia Panel: No results for input(s): VITAMINB12, FOLATE, FERRITIN, TIBC, IRON, RETICCTPCT in the last 72 hours. Urine analysis:    Component Value Date/Time   COLORURINE AMBER (A) 06/30/2015 1847   APPEARANCEUR CLOUDY (A) 06/30/2015 1847   LABSPEC 1.025 06/30/2015 1847   PHURINE 5.5 06/30/2015 1847   GLUCOSEU NEGATIVE 06/30/2015 1847   HGBUR NEGATIVE 06/30/2015 1847   BILIRUBINUR SMALL (A) 06/30/2015 1847   KETONESUR NEGATIVE 06/30/2015 1847   PROTEINUR NEGATIVE 06/30/2015 1847   NITRITE NEGATIVE 06/30/2015 1847   LEUKOCYTESUR NEGATIVE 06/30/2015 1847  Radiological Exams on Admission: CT ANGIO CHEST PE W OR WO CONTRAST  Result Date: 08/27/2020 CLINICAL DATA:  80 year old female with history of dyspnea on exertion and positive D-dimer. Suspected pulmonary embolism. EXAM: CT ANGIOGRAPHY CHEST WITH CONTRAST TECHNIQUE: Multidetector CT imaging of the chest was performed using the standard protocol during bolus administration of intravenous contrast. Multiplanar CT image reconstructions and MIPs were obtained to evaluate the vascular anatomy. CONTRAST:  12mL ISOVUE-370 IOPAMIDOL (ISOVUE-370) INJECTION 76% COMPARISON:  No priors. FINDINGS: Cardiovascular: Large filling defect within the distal right main pulmonary artery extending in the lobar, segmental and subsegmental sized branches in the right lung, most evident in the right lower lobe. These appear predominantly nonocclusive at this time. Heart size is mildly enlarged. Right ventricular diameter is approximately 31 mm. Left ventricular diameter is approximately 36 mm. RV to LV ratio of 0.86. There is no significant pericardial fluid,  thickening or pericardial calcification. Aortic atherosclerosis. No definite coronary artery calcifications. Mediastinum/Nodes: No pathologically enlarged mediastinal or hilar lymph nodes. Please note that accurate exclusion of hilar adenopathy is limited on noncontrast CT scans. Moderate sized hiatal hernia. No axillary lymphadenopathy. Lungs/Pleura: No acute consolidative airspace disease. No pleural effusions. No definite suspicious appearing pulmonary nodules or masses are noted on today's motion limited examination. Upper Abdomen: Aortic atherosclerosis. Musculoskeletal: There are no aggressive appearing lytic or blastic lesions noted in the visualized portions of the skeleton. Review of the MIP images confirms the above findings. IMPRESSION: 1. Large right-sided pulmonary embolism extending from the right main pulmonary artery into lobar, segmental and subsegmental sized branches in the right lung. 2. Mild cardiomegaly. 3. Aortic atherosclerosis. Critical Value/emergent results were called by telephone at the time of interpretation on 08/27/2020 at 12:25 pm to provider Rex Kras, who verbally acknowledged these results. Aortic Atherosclerosis (ICD10-I70.0). Electronically Signed   By: Vinnie Langton M.D.   On: 08/27/2020 12:25    EKG: Independently reviewed.  Sinus rhythm, right axis deviation  Assessment/Plan Active Problems:   Pulmonary emboli (HCC)  (please populate well all problems here in Problem List. (For example, if patient is on BP meds at home and you resume or decide to hold them, it is a problem that needs to be her. Same for CAD, COPD, HLD and so on)  Submassive PE, unprovoked -With worsening symptoms of dyspnea, plan to keep the patient overnight hospital for observation her oxygen levels and check ambulatory pulse ox tomorrow, once her symptoms stabilized and oxygen remains stable likely can switch to p.o. anticoagulation and discharged home within 24 hours. -Heparin drip  bridging for p.o. anticoagulation -Check echocardiogram, troponin level pending, check DVT study. -Expect at least 3 months of systemic undercollection and hypercoagulation work-up as outpatient.  Chronic diastolic CHF -Euvolemic and blood pressure fairly controlled, continue home BP meds and Entresto.  Severe asthma -No symptoms signs of acute exacerbation, outpatient Omalizumab  HTN -Stable  Morbid obesity -Calorie restrictions  DVT prophylaxis: Heparin drip Code Status: Full code Family Communication: None at bedside Disposition Plan: Expect less than 2 midnight hospital stay, extremities-home once oxygen levels stabilized and PE workup done. Consults called: None Admission status: Tele obs   Lequita Halt MD [facility] Pager [phone]  08/27/2020, 5:50 PM

## 2020-08-27 NOTE — ED Triage Notes (Signed)
Pt reports sob for the past 2 weeks, went to her PCP and had an outpatient CT done, showed a large pulmonary embolism. Res e.u at this time. Denies chest pain

## 2020-08-28 ENCOUNTER — Observation Stay (HOSPITAL_COMMUNITY): Payer: Medicare Other

## 2020-08-28 ENCOUNTER — Observation Stay (HOSPITAL_BASED_OUTPATIENT_CLINIC_OR_DEPARTMENT_OTHER): Payer: Medicare Other

## 2020-08-28 DIAGNOSIS — E785 Hyperlipidemia, unspecified: Secondary | ICD-10-CM | POA: Diagnosis present

## 2020-08-28 DIAGNOSIS — I2699 Other pulmonary embolism without acute cor pulmonale: Secondary | ICD-10-CM

## 2020-08-28 DIAGNOSIS — I5032 Chronic diastolic (congestive) heart failure: Secondary | ICD-10-CM

## 2020-08-28 DIAGNOSIS — J449 Chronic obstructive pulmonary disease, unspecified: Secondary | ICD-10-CM | POA: Diagnosis present

## 2020-08-28 DIAGNOSIS — R0602 Shortness of breath: Secondary | ICD-10-CM | POA: Diagnosis not present

## 2020-08-28 DIAGNOSIS — E872 Acidosis: Secondary | ICD-10-CM | POA: Diagnosis present

## 2020-08-28 DIAGNOSIS — Z6841 Body Mass Index (BMI) 40.0 and over, adult: Secondary | ICD-10-CM | POA: Diagnosis not present

## 2020-08-28 DIAGNOSIS — Z87891 Personal history of nicotine dependence: Secondary | ICD-10-CM | POA: Diagnosis not present

## 2020-08-28 DIAGNOSIS — I110 Hypertensive heart disease with heart failure: Secondary | ICD-10-CM | POA: Diagnosis present

## 2020-08-28 DIAGNOSIS — R7989 Other specified abnormal findings of blood chemistry: Secondary | ICD-10-CM | POA: Diagnosis present

## 2020-08-28 DIAGNOSIS — Z8249 Family history of ischemic heart disease and other diseases of the circulatory system: Secondary | ICD-10-CM | POA: Diagnosis not present

## 2020-08-28 DIAGNOSIS — M810 Age-related osteoporosis without current pathological fracture: Secondary | ICD-10-CM | POA: Diagnosis present

## 2020-08-28 DIAGNOSIS — I2694 Multiple subsegmental pulmonary emboli without acute cor pulmonale: Secondary | ICD-10-CM

## 2020-08-28 DIAGNOSIS — Z79899 Other long term (current) drug therapy: Secondary | ICD-10-CM | POA: Diagnosis not present

## 2020-08-28 LAB — BASIC METABOLIC PANEL
Anion gap: 11 (ref 5–15)
BUN: 12 mg/dL (ref 8–23)
CO2: 21 mmol/L — ABNORMAL LOW (ref 22–32)
Calcium: 9.4 mg/dL (ref 8.9–10.3)
Chloride: 105 mmol/L (ref 98–111)
Creatinine, Ser: 0.96 mg/dL (ref 0.44–1.00)
GFR, Estimated: >60 mL/min (ref >60)
Glucose, Bld: 94 mg/dL (ref 70–99)
Potassium: 4.1 mmol/L (ref 3.5–5.1)
Sodium: 137 mmol/L (ref 135–145)

## 2020-08-28 LAB — HEPARIN LEVEL (UNFRACTIONATED)
Heparin Unfractionated: 0.54 IU/mL (ref 0.30–0.70)
Heparin Unfractionated: 0.82 IU/mL — ABNORMAL HIGH (ref 0.30–0.70)
Heparin Unfractionated: 0.85 IU/mL — ABNORMAL HIGH (ref 0.30–0.70)

## 2020-08-28 LAB — CBC
HCT: 36.8 % (ref 36.0–46.0)
Hemoglobin: 12 g/dL (ref 12.0–15.0)
MCH: 29.8 pg (ref 26.0–34.0)
MCHC: 32.6 g/dL (ref 30.0–36.0)
MCV: 91.3 fL (ref 80.0–100.0)
Platelets: 244 10*3/uL (ref 150–400)
RBC: 4.03 MIL/uL (ref 3.87–5.11)
RDW: 14.1 % (ref 11.5–15.5)
WBC: 9.8 10*3/uL (ref 4.0–10.5)
nRBC: 0 % (ref 0.0–0.2)

## 2020-08-28 LAB — ECHOCARDIOGRAM COMPLETE
Area-P 1/2: 3.17 cm2
Height: 63 in
S' Lateral: 2.2 cm
Weight: 4096 oz

## 2020-08-28 NOTE — Progress Notes (Signed)
ANTICOAGULATION CONSULT NOTE  Pharmacy Consult for Heparin Indication: pulmonary embolus  No Known Allergies  Patient Measurements: Height: 5\' 3"  (160 cm) Weight: 116.1 kg (256 lb) IBW/kg (Calculated) : 52.4 Heparin Dosing Weight: 80.7kg  Vital Signs: Temp: 98.3 F (36.8 C) (04/22 2140) Temp Source: Oral (04/22 2140) BP: 130/50 (04/22 2140) Pulse Rate: 70 (04/22 2140)  Labs: Recent Labs    08/26/20 1342 08/27/20 1430 08/27/20 1705 08/28/20 0014  HGB  --  13.0  --   --   HCT  --  40.4  --   --   PLT  --  229  --   --   APTT  --  31  --   --   HEPARINUNFRC  --   --   --  0.82*  CREATININE 1.04*  --   --   --   TROPONINIHS  --  5 4  --     Estimated Creatinine Clearance: 53.9 mL/min (A) (by C-G formula based on SCr of 1.04 mg/dL (H)).   Medical History: Past Medical History:  Diagnosis Date  . Asthma   . Breast cyst   . CHF (congestive heart failure) ([place])   . Edema   . Hypertension   . Osteoporosis 11/2017   T score -3.4 distal third radius    Medications:  Infusions:  . heparin 1,300 Units/hr (08/27/20 2311)    Assessment: 73 yof presented to the ED with a new PE. CBC is pending but has been normal in the past. No AC PTA. No bleeding noted.   4/23 AM update:  Heparin level elevated  Goal of Therapy:  Heparin level 0.3-0.7 units/ml Monitor platelets by anticoagulation protocol: Yes   Plan:  Dec heparin to 1100 units/hr Check an 8 hr heparin level Daily heparin level and CBC  Narda Bonds, PharmD, BCPS Clinical Pharmacist Phone: [phone]

## 2020-08-28 NOTE — Progress Notes (Signed)
  Echocardiogram 2D Echocardiogram has been performed.  Kimberly Crosby 08/28/2020, 9:47 AM

## 2020-08-28 NOTE — Progress Notes (Signed)
PROGRESS NOTE    Kimberly Crosby  ZOX:[id number] DOB: 1941-04-13 DOA: 08/27/2020 PCP: Lucianne Lei, MD   Chief Complaint  Patient presents with  . Shortness of Breath   Brief Narrative: 80 year old female with severe asthma history, hypertension, chronic diastolic CHF, hyperlipidemia, morbid obesity presented with shortness of breath onset 2 weeks ago with exertional symptoms and gradually worsening, no chest pain cough fever chills or leg swelling or travel history.  At baseline able to walk 3 miles.  She went to see her cardiologist due to shortness of breath CT angio showed large right PE and patient was  Sent to hospital for admission and started on Heparin drip.  Subjective: Seen this morning.  Sitting on the edge of the bed.  On room air Reports she is feeling some better.  She is hard of hearing Overnight afebrile doing well on room air blood pressure on higher side stable troponin -> 5 and 4 and BNP 43.   Assessment & Plan:  Acute submassive PE, unprovoked, symptomatic with worsening shortness of breath dyspnea x2 weeks.  Admitted on heparin drip we will continue the same.  Follow-up echocardiogram, duplex of the leg.  BNP and troponin levels are reassuring.  Hemodynamically stable.  Will need to follow-up with her primary PCP/cardiology regarding duration of anticoagulation would expect at least 3 to 6 months.  Hopefully start Xarelto tomorrow. I discussed Coumadin versus DOAC-risk/benefits/alternatives and she prefers Xarelto  Chronic diastolic CHF euvolemic continue home Entresto Aldactone HCTZ Net IO Since Admission: 241.75 mL [08/28/20 0830]  Filed Weights   08/27/20 1348  Weight: 116.1 kg   Severe asthma not in exacerbation on outpatient omalizumab Mild metabolic acidosis bicarb 19, repeat BMP today Hypertension blood pressure controlled on Aldactone HCTZ Entresto Morbid obesity with BMI 45: Will benefit with PCP follow-up weight loss strategy as outpatient   Diet Order             Diet Heart Room service appropriate? Yes; Fluid consistency: Thin  Diet effective now               Patient's Body mass index is 45.35 kg/m. DVT prophylaxis: Heparin Code Status:   Code Status: Full Code  Family Communication: plan of care discussed with patient at bedside.  Status is: admitted as Observation Remains hospitalized for ongoing management of submassive PE needing further work-up and medical stabilization and conversion to oral therapy.  Dispo: The patient is from: Home              Anticipated d/c is to: Home              Patient currently is not medically stable to d/c.   Difficult to place patient No  Unresulted Labs (From admission, onward)          Start     Ordered   08/28/20 1000  Heparin level (unfractionated)  Once-Timed,   TIMED       Question:  Specimen collection method  Answer:  Lab=Lab collect   08/28/20 0111   08/28/20 0500  CBC  Daily,   R      08/27/20 1422          Medications reviewed:  Scheduled Meds: . citalopram  10 mg Oral Daily  . spironolactone  25 mg Oral Daily   And  . hydrochlorothiazide  25 mg Oral Daily  . latanoprost  1 drop Both Eyes QHS  . loratadine  10 mg Oral Daily  . sacubitril-valsartan  1  tablet Oral Daily  . triamcinolone cream   Topical BID   Continuous Infusions: . heparin 1,100 Units/hr (08/28/20 0617)    Consultants:see note  Procedures:see note  Antimicrobials: Anti-infectives (From admission, onward)   None     Culture/Microbiology    Component Value Date/Time   SDES URINE, CLEAN CATCH 06/30/2015 1847   SPECREQUEST NONE 06/30/2015 1847   CULT MULTIPLE SPECIES PRESENT, SUGGEST RECOLLECTION 06/30/2015 1847   REPTSTATUS 07/01/2015 FINAL 06/30/2015 1847    Other culture-see note  Objective: Vitals: Today's Vitals   08/27/20 1850 08/27/20 2005 08/27/20 2140 08/28/20 0546  BP: 125/86  (!) 130/50 (!) 150/67  Pulse: 69  70 75  Resp: 18  17 19   Temp: 98.6 F (37 C)  98.3 F (36.8 C)  98.7 F (37.1 C)  TempSrc:   Oral Oral  SpO2: 100%  100% 98%  Weight:      Height:      PainSc:  0-No pain      Intake/Output Summary (Last 24 hours) at 08/28/2020 0830 Last data filed at 08/28/2020 4196 Gross per 24 hour  Intake 241.75 ml  Output --  Net 241.75 ml   Filed Weights   08/27/20 1348  Weight: 116.1 kg   Weight change:   Intake/Output from previous day: 04/22 0701 - 04/23 0700 In: 241.8 [I.V.:241.8] Out: -  Intake/Output this shift: No intake/output data recorded. Filed Weights   08/27/20 1348  Weight: 116.1 kg    Examination: General exam: AAOx3, obese, mildly hard ofhearing ,NAD, weak appearing. HEENT:Oral mucosa moist, Ear/Nose WNL grossly,dentition normal. Respiratory system: bilaterally diminished,no use of accessory muscle, non tender. Cardiovascular system: S1 & S2 +, regular, No JVD. Gastrointestinal system: Abdomen soft, NT,ND, BS+. Nervous System:Alert, awake, moving extremities and grossly nonfocal Extremities: Non pitting edema, distal peripheral pulses palpable.  Skin: No rashes,no icterus. MSK: Normal muscle bulk,tone, power  Data Reviewed: I have personally reviewed following labs and imaging studies CBC: Recent Labs  Lab 08/27/20 1430 08/28/20 0117  WBC 9.1 9.8  NEUTROABS 6.0  --   HGB 13.0 12.0  HCT 40.4 36.8  MCV 92.0 91.3  PLT 229 222   Basic Metabolic Panel: Recent Labs  Lab 08/26/20 1342  NA 141  K 4.3  CL 106  CO2 19*  GLUCOSE 110*  BUN 16  CREATININE 1.04*  CALCIUM 9.6  MG 1.9   GFR: Estimated Creatinine Clearance: 53.9 mL/min (A) (by C-G formula based on SCr of 1.04 mg/dL (H)). Liver Function Tests: No results for input(s): AST, ALT, ALKPHOS, BILITOT, PROT, ALBUMIN in the last 168 hours. No results for input(s): LIPASE, AMYLASE in the last 168 hours. No results for input(s): AMMONIA in the last 168 hours. Coagulation Profile: No results for input(s): INR, PROTIME in the last 168 hours. Cardiac Enzymes: No  results for input(s): CKTOTAL, CKMB, CKMBINDEX, TROPONINI in the last 168 hours. BNP (last 3 results) Recent Labs    08/26/20 1342  PROBNP 143   HbA1C: No results for input(s): HGBA1C in the last 72 hours. CBG: No results for input(s): GLUCAP in the last 168 hours. Lipid Profile: No results for input(s): CHOL, HDL, LDLCALC, TRIG, CHOLHDL, LDLDIRECT in the last 72 hours. Thyroid Function Tests: No results for input(s): TSH, T4TOTAL, FREET4, T3FREE, THYROIDAB in the last 72 hours. Anemia Panel: No results for input(s): VITAMINB12, FOLATE, FERRITIN, TIBC, IRON, RETICCTPCT in the last 72 hours. Sepsis Labs: No results for input(s): PROCALCITON, LATICACIDVEN in the last 168 hours.  No results found  for this or any previous visit (from the past 240 hour(s)).   Radiology Studies: CT ANGIO CHEST PE W OR WO CONTRAST  Result Date: 08/27/2020 CLINICAL DATA:  80 year old female with history of dyspnea on exertion and positive D-dimer. Suspected pulmonary embolism. EXAM: CT ANGIOGRAPHY CHEST WITH CONTRAST TECHNIQUE: Multidetector CT imaging of the chest was performed using the standard protocol during bolus administration of intravenous contrast. Multiplanar CT image reconstructions and MIPs were obtained to evaluate the vascular anatomy. CONTRAST:  46mL ISOVUE-370 IOPAMIDOL (ISOVUE-370) INJECTION 76% COMPARISON:  No priors. FINDINGS: Cardiovascular: Large filling defect within the distal right main pulmonary artery extending in the lobar, segmental and subsegmental sized branches in the right lung, most evident in the right lower lobe. These appear predominantly nonocclusive at this time. Heart size is mildly enlarged. Right ventricular diameter is approximately 31 mm. Left ventricular diameter is approximately 36 mm. RV to LV ratio of 0.86. There is no significant pericardial fluid, thickening or pericardial calcification. Aortic atherosclerosis. No definite coronary artery calcifications.  Mediastinum/Nodes: No pathologically enlarged mediastinal or hilar lymph nodes. Please note that accurate exclusion of hilar adenopathy is limited on noncontrast CT scans. Moderate sized hiatal hernia. No axillary lymphadenopathy. Lungs/Pleura: No acute consolidative airspace disease. No pleural effusions. No definite suspicious appearing pulmonary nodules or masses are noted on today's motion limited examination. Upper Abdomen: Aortic atherosclerosis. Musculoskeletal: There are no aggressive appearing lytic or blastic lesions noted in the visualized portions of the skeleton. Review of the MIP images confirms the above findings. IMPRESSION: 1. Large right-sided pulmonary embolism extending from the right main pulmonary artery into lobar, segmental and subsegmental sized branches in the right lung. 2. Mild cardiomegaly. 3. Aortic atherosclerosis. Critical Value/emergent results were called by telephone at the time of interpretation on 08/27/2020 at 12:25 pm to provider Rex Kras, who verbally acknowledged these results. Aortic Atherosclerosis (ICD10-I70.0). Electronically Signed   By: Vinnie Langton M.D.   On: 08/27/2020 12:25     LOS: 0 days   Antonieta Pert, MD [facility]  08/28/2020, 8:30 AM

## 2020-08-28 NOTE — Progress Notes (Signed)
ANTICOAGULATION CONSULT NOTE  Pharmacy Consult for Heparin Indication: pulmonary embolus  No Known Allergies  Patient Measurements: Height: 5\' 3"  (160 cm) Weight: 116.1 kg (256 lb) IBW/kg (Calculated) : 52.4 Heparin Dosing Weight: 80.7kg  Vital Signs: Temp: 98.7 F (37.1 C) (04/23 0546) Temp Source: Oral (04/23 0546) BP: 110/97 (04/23 1045) Pulse Rate: 75 (04/23 0546)  Labs: Recent Labs    08/26/20 1342 08/27/20 1430 08/27/20 1705 08/28/20 0014 08/28/20 0117 08/28/20 1022 08/28/20 1026  HGB  --  13.0  --   --  12.0  --   --   HCT  --  40.4  --   --  36.8  --   --   PLT  --  229  --   --  244  --   --   APTT  --  31  --   --   --   --   --   HEPARINUNFRC  --   --   --  0.82*  --   --  0.85*  CREATININE 1.04*  --   --   --   --  0.96  --   TROPONINIHS  --  5 4  --   --   --   --     Estimated Creatinine Clearance: 58.4 mL/min (by C-G formula based on SCr of 0.96 mg/dL).   Medical History: Past Medical History:  Diagnosis Date  . Asthma   . Breast cyst   . CHF (congestive heart failure) ([place])   . Edema   . Hypertension   . Osteoporosis 11/2017   T score -3.4 distal third radius    Medications:  Infusions:  . heparin 1,100 Units/hr (08/28/20 0617)    Assessment: 89 yof presented to the ED with a new PE. CBC is pending but has been normal in the past. No AC PTA. No bleeding noted. Heparin level remains elevated at 0.85 after dose reduction. Lab appears to be drawn correctly.   Goal of Therapy:  Heparin level 0.3-0.7 units/ml Monitor platelets by anticoagulation protocol: Yes   Plan:  Dec heparin to 1000 units/hr Check an 8 hr heparin level Daily heparin level and [place], PharmD, [facility] Resident [phone] 08/28/2020 11:21 AM

## 2020-08-28 NOTE — Plan of Care (Signed)

## 2020-08-28 NOTE — Progress Notes (Signed)
VASCULAR LAB    Bilateral lower extremity venous duplex has been performed.  See CV proc for preliminary results.   Lumen Brinlee, RVT 08/28/2020, 9:59 AM

## 2020-08-28 NOTE — Progress Notes (Signed)
ANTICOAGULATION CONSULT NOTE  Pharmacy Consult for Heparin Indication: pulmonary embolus  No Known Allergies  Patient Measurements: Height: 5\' 3"  (160 cm) Weight: 116.1 kg (256 lb) IBW/kg (Calculated) : 52.4 Heparin Dosing Weight: 80.7kg  Vital Signs: Temp: 97.9 F (36.6 C) (04/23 1950) Temp Source: Oral (04/23 1950) BP: 132/85 (04/23 1950) Pulse Rate: 82 (04/23 1950)  Labs: Recent Labs    08/26/20 1342 08/27/20 1430 08/27/20 1705 08/28/20 0014 08/28/20 0117 08/28/20 1022 08/28/20 1026 08/28/20 2219  HGB  --  13.0  --   --  12.0  --   --   --   HCT  --  40.4  --   --  36.8  --   --   --   PLT  --  229  --   --  244  --   --   --   APTT  --  31  --   --   --   --   --   --   HEPARINUNFRC  --   --   --  0.82*  --   --  0.85* 0.54  CREATININE 1.04*  --   --   --   --  0.96  --   --   TROPONINIHS  --  5 4  --   --   --   --   --     Estimated Creatinine Clearance: 58.4 mL/min (by C-G formula based on SCr of 0.96 mg/dL).   Medical History: Past Medical History:  Diagnosis Date  . Asthma   . Breast cyst   . CHF (congestive heart failure) ([place])   . Edema   . Hypertension   . Osteoporosis 11/2017   T score -3.4 distal third radius    Medications:  Infusions:  . heparin 1,000 Units/hr (08/28/20 1525)    Assessment: 58 yof presented to the ED with a new PE. CBC is pending but has been normal in the past. No AC PTA. No bleeding noted.    4/23 PM update:  Heparin level therapeutic after rate decrease  Goal of Therapy:  Heparin level 0.3-0.7 units/ml Monitor platelets by anticoagulation protocol: Yes   Plan:  Cont heparin at 1000 units/hr Confirmatory heparin level with AM labs Daily heparin level and CBC  Narda Bonds, PharmD, BCPS Clinical Pharmacist Phone: [phone]

## 2020-08-29 DIAGNOSIS — I2699 Other pulmonary embolism without acute cor pulmonale: Secondary | ICD-10-CM | POA: Diagnosis not present

## 2020-08-29 LAB — CBC
HCT: 35.7 % — ABNORMAL LOW (ref 36.0–46.0)
Hemoglobin: 11.7 g/dL — ABNORMAL LOW (ref 12.0–15.0)
MCH: 29.6 pg (ref 26.0–34.0)
MCHC: 32.8 g/dL (ref 30.0–36.0)
MCV: 90.4 fL (ref 80.0–100.0)
Platelets: 236 10*3/uL (ref 150–400)
RBC: 3.95 MIL/uL (ref 3.87–5.11)
RDW: 13.8 % (ref 11.5–15.5)
WBC: 9.2 10*3/uL (ref 4.0–10.5)
nRBC: 0 % (ref 0.0–0.2)

## 2020-08-29 LAB — BASIC METABOLIC PANEL
Anion gap: 8 (ref 5–15)
BUN: 22 mg/dL (ref 8–23)
CO2: 22 mmol/L (ref 22–32)
Calcium: 9 mg/dL (ref 8.9–10.3)
Chloride: 105 mmol/L (ref 98–111)
Creatinine, Ser: 0.97 mg/dL (ref 0.44–1.00)
GFR, Estimated: 59 mL/min — ABNORMAL LOW (ref >60)
Glucose, Bld: 85 mg/dL (ref 70–99)
Potassium: 4.4 mmol/L (ref 3.5–5.1)
Sodium: 135 mmol/L (ref 135–145)

## 2020-08-29 LAB — HEPARIN LEVEL (UNFRACTIONATED): Heparin Unfractionated: 0.48 IU/mL (ref 0.30–0.70)

## 2020-08-29 MED ORDER — RIVAROXABAN 20 MG PO TABS: 20.0000 mg | ORAL_TABLET | Freq: Every day | ORAL | Status: DC

## 2020-08-29 MED ORDER — RIVAROXABAN (XARELTO) VTE STARTER PACK (15 & 20 MG): ORAL_TABLET | ORAL | 0 refills | Status: DC

## 2020-08-29 MED ORDER — RIVAROXABAN 15 MG PO TABS
15.0000 mg | ORAL_TABLET | Freq: Two times a day (BID) | ORAL | Status: DC
  Administered 2020-08-29: 15 mg via ORAL
  Filled 2020-08-29: qty 1

## 2020-08-29 NOTE — Evaluation (Signed)
Physical Therapy Evaluation Patient Details Name: Kimberly Crosby MRN: [id number] DOB: [birth year] Today's Date: 08/29/2020   History of Present Illness  Pt is a 80 y.o. F who presents with shortness of breath x 2 weeks. Found to have acute submassive PE. Admitted and started on Heparin drip. Significant PMH: asthma, HTN, chronic diastolic CHF, HLD, morbid obesity.  Clinical Impression  Pt admitted with above. Prior to admission, pt lives alone and is active; she reports walking 3 miles/day. Pt presents with decreased functional mobility secondary to decreased cardiopulmonary endurance. Pt ambulating x 200 feet with no assistive device or physical assist; HR 90-141 bpm, SpO2 96-98% on RA. Education provided regarding activity recommendations and progression. Pt plans to discharge home with her daughter for supervision initially. No further acute PT needs. Thank you for this consult.     Follow Up Recommendations No PT follow up    Equipment Recommendations  None recommended by PT    Recommendations for Other Services       Precautions / Restrictions Precautions Precautions: Other (comment) Precaution Comments: watch HR Restrictions Weight Bearing Restrictions: No      Mobility  Bed Mobility               General bed mobility comments: Standing in room upon arrival    Transfers Overall transfer level: Independent                  Ambulation/Gait Ambulation/Gait assistance: Independent Gait Distance (Feet): 200 Feet Assistive device: None Gait Pattern/deviations: WFL(Within Functional Limits)     General Gait Details: Cues for activity pacing; pt taking one standing rest break. No gross instability noted  Stairs            Wheelchair Mobility    Modified Rankin (Stroke Patients Only)       Balance Overall balance assessment: No apparent balance deficits (not formally assessed)                                            Pertinent Vitals/Pain Pain Assessment: No/denies pain    Home Living Family/patient expects to be discharged to:: Private residence Living Arrangements: Children (daughter) Available Help at Discharge: Family Type of Home: House Home Access: Stairs to enter   Technical brewer of Steps: 3 (1-2 from sidewalk then step up from the porch) Home Layout: Able to live on main level with bedroom/bathroom   Additional Comments: Plans to d/c home with daughter    Prior Function Level of Independence: Independent         Comments: Walks 3 miles/day     Hand Dominance        Extremity/Trunk Assessment   Upper Extremity Assessment Upper Extremity Assessment: Overall WFL for tasks assessed    Lower Extremity Assessment Lower Extremity Assessment: Overall WFL for tasks assessed       Communication   Communication: HOH  Cognition Arousal/Alertness: Awake/alert Behavior During Therapy: WFL for tasks assessed/performed Overall Cognitive Status: Within Functional Limits for tasks assessed                                        General Comments      Exercises     Assessment/Plan    PT Assessment Patent does not need any further PT services  PT Problem List         PT Treatment Interventions      PT Goals (Current goals can be found in the Care Plan section)  Acute Rehab PT Goals Patient Stated Goal: return home PT Goal Formulation: All assessment and education complete, DC therapy    Frequency     Barriers to discharge        Co-evaluation               AM-PAC PT "6 Clicks" Mobility  Outcome Measure Help needed turning from your back to your side while in a flat bed without using bedrails?: None Help needed moving from lying on your back to sitting on the side of a flat bed without using bedrails?: None Help needed moving to and from a bed to a chair (including a wheelchair)?: None Help needed standing up from a chair using  your arms (e.g., wheelchair or bedside chair)?: None Help needed to walk in hospital room?: None Help needed climbing 3-5 steps with a railing? : A Little 6 Click Score: 23    End of Session   Activity Tolerance: Patient tolerated treatment well Patient left: in bed;with call bell/phone within reach Nurse Communication: Mobility status PT Visit Diagnosis: Difficulty in walking, not elsewhere classified (R26.2)    Time: 0938-1829 PT Time Calculation (min) (ACUTE ONLY): 10 min   Charges:   PT Evaluation $PT Eval Moderate Complexity: 1 Mod          Wyona Almas, PT, DPT Acute Rehabilitation Services Pager [phone] Office [phone]   Deno Etienne 08/29/2020, 11:42 AM

## 2020-08-29 NOTE — Discharge Summary (Signed)
Physician Discharge Summary  Kimberly Crosby [id number] DOB: [birth year] DOA: 08/27/2020  PCP: Lucianne Lei, MD  Admit date: 08/27/2020 Discharge date: 08/29/2020  Admitted From: home Disposition:  home  Recommendations for Outpatient Follow-up:  1. Follow up with PCP in 1-2 weeks 2. Please obtain BMP/CBC in one week   Home Health:no  Equipment/Devices: non  Discharge Condition: Stable Code Status:   Code Status: Full Code Diet recommendation:  Diet Order            Diet - low sodium heart healthy           Diet Heart Room service appropriate? Yes; Fluid consistency: Thin  Diet effective now                  Brief/Interim Summary: 80 year old female with severe asthma history, hypertension, chronic diastolic CHF, hyperlipidemia, morbid obesity presented with shortness of breath onset 2 weeks ago with exertional symptoms and gradually worsening, no chest pain cough fever chills or leg swelling or travel history.  At baseline able to walk 3 miles.  She went to see her cardiologist due to shortness of breath CT angio showed large right PE and patient was  Sent to hospital for admission and started on Heparin drip. Patient did well with hospitalization no need for oxygen no more shortness of breath with ambulation.  Transitioned from heparin to Xarelto after discussing risk benefits and alternatives. She will follow-up with PCP and hemorrhoid oncology if needed.  Discharge Diagnoses:  Acute submassive PE by CT: symptomatic with worsening shortness of breath dyspnea x2 weeks.  Patient did well with hospitalization no need for oxygen no more shortness of breath with ambulation.  Transitioned from heparin to Xarelto after discussing risk benefits and alternatives.  No DVT on duplex.  Echocardiogram with normal EF, grade 1 DD and normal RV systolic function but mildly enlarged RV size as below   "Left ventricular ejection fraction, by estimation, is 65 to 70%. The  left ventricle has  normal function. The left ventricle has no regional  wall motion abnormalities. There is mild left ventricular hypertrophy.  Left ventricular diastolic parameters  are consistent with Grade I diastolic dysfunction (impaired relaxation).  2. Right ventricular systolic function is normal. The right ventricular  size is mildly enlarged. There is mildly elevated pulmonary artery  systolic pressure. The estimated right ventricular systolic pressure is  Q000111Q mmHg. " At this time is DOING WELL  and stable for discharge home.  Chronic diastolic CHF euvolemic continue home Entresto Aldactone HCTZ Net IO Since Admission: 241.75 mL [08/29/20 1008]  Filed Weights   08/27/20 1348  Weight: 116.1 kg   Severe asthma not in exacerbation, continue on outpatient omalizumab Mild metabolic acidosis bicarb improved to 22.   Hypertension blood pressure stable on Aldactone HCTZ Entresto Morbid obesity with BMI 45: Will benefit with PCP follow-up weight loss strategy as outpatient  Consults:  none  Subjective: Alert awake oriented resting comfortably on room air.  He has been ambulating to the hallway no more shortness of breath.  No chest pain.  Feels okay for discharge home today. Discharge Exam: Vitals:   08/28/20 1950 08/29/20 0533  BP: 132/85 127/63  Pulse: 82 60  Resp: 18 15  Temp: 97.9 F (36.6 C) 98 F (36.7 C)  SpO2: 100% 99%   General: Pt is alert, awake, not in acute distress Cardiovascular: RRR, S1/S2 +, no rubs, no gallops Respiratory: CTA bilaterally, no wheezing, no rhonchi Abdominal: Soft, NT, ND, bowel  sounds + Extremities: no edema, no cyanosis  Discharge Instructions  Discharge Instructions    Diet - low sodium heart healthy   Complete by: As directed    Discharge instructions   Complete by: As directed    Please call call MD or return to ER for similar or worsening recurring problem that brought you to hospital or if any fever,nausea/vomiting,abdominal pain,  uncontrolled pain, chest pain,  shortness of breath or any other alarming symptoms.  TAKE YOUR xarelto 15 mg bid for 21 DAYS (Please count  Day from 08/29/20 am) then 20 mg daily- you need to discuss with your primary care/hematology for duration of your anticoagulation.  Please follow-up your doctor as instructed in a week time and call the office for appointment.  Please avoid alcohol, smoking, or any other illicit substance and maintain healthy habits including taking your regular medications as prescribed.  You were cared for by a hospitalist during your hospital stay. If you have any questions about your discharge medications or the care you received while you were in the hospital after you are discharged, you can call the unit and ask to speak with the hospitalist on call if the hospitalist that took care of you is not available.  Once you are discharged, your primary care physician will handle any further medical issues. Please note that NO REFILLS for any discharge medications will be authorized once you are discharged, as it is imperative that you return to your primary care physician (or establish a relationship with a primary care physician if you do not have one) for your aftercare needs so that they can reassess your need for medications and monitor your lab values   Increase activity slowly   Complete by: As directed      Allergies as of 08/29/2020   No Known Allergies     Medication List    TAKE these medications   betamethasone dipropionate 0.05 % ointment Commonly known as: DIPROLENE Apply 1 application topically daily.   fexofenadine 180 MG tablet Commonly known as: ALLEGRA Take 1 tablet (180 mg total) by mouth daily. What changed:   when to take this  reasons to take this   FISH OIL PO Take 1 tablet by mouth daily.   latanoprost 0.005 % ophthalmic solution Commonly known as: XALATAN Place 1 drop into both eyes at bedtime.   omalizumab 150 MG/ML prefilled  syringe Commonly known as: XOLAIR Inject 150 mg into the skin every 28 (twenty-eight) days.   Rivaroxaban Stater Pack (15 mg and 20 mg) Commonly known as: XARELTO STARTER PACK Follow package directions: Take one 15mg  tablet by mouth twice a day. On day 22, switch to one 20mg  tablet once a day. Take with food.   sacubitril-valsartan 97-103 MG Commonly known as: ENTRESTO Take 1 tablet by mouth daily.   spironolactone-hydrochlorothiazide 25-25 MG tablet Commonly known as: ALDACTAZIDE TAKE 1 TABLET BY MOUTH DAILY   SYSTANE BALANCE OP Apply 1 drop to eye daily.   triamcinolone 0.025 % ointment Commonly known as: KENALOG Apply 1 application topically 2 (two) times daily.       Follow-up Information    Lucianne Lei, MD Follow up in 1 week(s).   Specialty: Family Medicine Contact information: [place], [place] [address] [place] [address] [phone]              No Known Allergies  The results of significant diagnostics from this hospitalization (including imaging, microbiology, ancillary and laboratory) are listed below for reference.  Microbiology: No results found for this or any previous visit (from the past 240 hour(s)).  Procedures/Studies: CT ANGIO CHEST PE W OR WO CONTRAST  Result Date: 08/27/2020 CLINICAL DATA:  80 year old female with history of dyspnea on exertion and positive D-dimer. Suspected pulmonary embolism. EXAM: CT ANGIOGRAPHY CHEST WITH CONTRAST TECHNIQUE: Multidetector CT imaging of the chest was performed using the standard protocol during bolus administration of intravenous contrast. Multiplanar CT image reconstructions and MIPs were obtained to evaluate the vascular anatomy. CONTRAST:  75mL ISOVUE-370 IOPAMIDOL (ISOVUE-370) INJECTION 76% COMPARISON:  No priors. FINDINGS: Cardiovascular: Large filling defect within the distal right main pulmonary artery extending in the lobar, segmental and subsegmental sized branches in the right lung, most evident in  the right lower lobe. These appear predominantly nonocclusive at this time. Heart size is mildly enlarged. Right ventricular diameter is approximately 31 mm. Left ventricular diameter is approximately 36 mm. RV to LV ratio of 0.86. There is no significant pericardial fluid, thickening or pericardial calcification. Aortic atherosclerosis. No definite coronary artery calcifications. Mediastinum/Nodes: No pathologically enlarged mediastinal or hilar lymph nodes. Please note that accurate exclusion of hilar adenopathy is limited on noncontrast CT scans. Moderate sized hiatal hernia. No axillary lymphadenopathy. Lungs/Pleura: No acute consolidative airspace disease. No pleural effusions. No definite suspicious appearing pulmonary nodules or masses are noted on today's motion limited examination. Upper Abdomen: Aortic atherosclerosis. Musculoskeletal: There are no aggressive appearing lytic or blastic lesions noted in the visualized portions of the skeleton. Review of the MIP images confirms the above findings. IMPRESSION: 1. Large right-sided pulmonary embolism extending from the right main pulmonary artery into lobar, segmental and subsegmental sized branches in the right lung. 2. Mild cardiomegaly. 3. Aortic atherosclerosis. Critical Value/emergent results were called by telephone at the time of interpretation on 08/27/2020 at 12:25 pm to provider Rex Kras, who verbally acknowledged these results. Aortic Atherosclerosis (ICD10-I70.0). Electronically Signed   By: Vinnie Langton M.D.   On: 08/27/2020 12:25   ECHOCARDIOGRAM COMPLETE  Result Date: 08/28/2020    ECHOCARDIOGRAM REPORT   Patient Name:   Kimberly Crosby Date of Exam: 08/28/2020 Medical Rec #:  [id number]      Height:       63.0 in Accession #:    [id number]     Weight:       256.0 lb Date of Birth:  Apr 24, 1941      BSA:          2.148 m Patient Age:    64 years       BP:           150/67 mmHg Patient Gender: F              HR:           65 bpm. Exam  Location:  Inpatient Procedure: 2D Echo Indications:    pulmonary embolus  History:        Patient has no prior history of Echocardiogram examinations.                 CHF; Risk Factors:Hypertension and Dyslipidemia.  Sonographer:    Johny Chess Referring Phys: [id number]  Sonographer Comments: Image acquisition challenging due to patient body habitus. IMPRESSIONS  1. Left ventricular ejection fraction, by estimation, is 65 to 70%. The left ventricle has normal function. The left ventricle has no regional wall motion abnormalities. There is mild left ventricular hypertrophy. Left ventricular diastolic parameters are consistent with Grade I diastolic dysfunction (impaired relaxation).  2. Right ventricular systolic function is normal. The right ventricular size is mildly enlarged. There is mildly elevated pulmonary artery systolic pressure. The estimated right ventricular systolic pressure is Q000111Q mmHg.  3. The mitral valve is normal in structure. No evidence of mitral valve regurgitation.  4. The aortic valve is tricuspid. Aortic valve regurgitation is not visualized. No aortic stenosis is present.  5. The inferior vena cava is normal in size with greater than 50% respiratory variability, suggesting right atrial pressure of 3 mmHg. FINDINGS  Left Ventricle: Left ventricular ejection fraction, by estimation, is 65 to 70%. The left ventricle has normal function. The left ventricle has no regional wall motion abnormalities. The left ventricular internal cavity size was normal in size. There is  mild left ventricular hypertrophy. Left ventricular diastolic parameters are consistent with Grade I diastolic dysfunction (impaired relaxation). Right Ventricle: The right ventricular size is mildly enlarged. No increase in right ventricular wall thickness. Right ventricular systolic function is normal. There is mildly elevated pulmonary artery systolic pressure. The tricuspid regurgitant velocity is 2.81 m/s,  and with an assumed right atrial pressure of 3 mmHg, the estimated right ventricular systolic pressure is Q000111Q mmHg. Left Atrium: Left atrial size was normal in size. Right Atrium: Right atrial size was normal in size. Pericardium: There is no evidence of pericardial effusion. Mitral Valve: The mitral valve is normal in structure. No evidence of mitral valve regurgitation. Tricuspid Valve: The tricuspid valve is normal in structure. Tricuspid valve regurgitation is trivial. Aortic Valve: The aortic valve is tricuspid. Aortic valve regurgitation is not visualized. No aortic stenosis is present. Pulmonic Valve: The pulmonic valve was not well visualized. Pulmonic valve regurgitation is not visualized. Aorta: The aortic root and ascending aorta are structurally normal, with no evidence of dilitation. Venous: The inferior vena cava is normal in size with greater than 50% respiratory variability, suggesting right atrial pressure of 3 mmHg. IAS/Shunts: The interatrial septum was not well visualized.  LEFT VENTRICLE PLAX 2D LVIDd:         4.10 cm  Diastology LVIDs:         2.20 cm  LV e' medial:    6.31 cm/s LV PW:         1.10 cm  LV E/e' medial:  9.5 LV IVS:        1.10 cm  LV e' lateral:   9.03 cm/s LVOT diam:     1.70 cm  LV E/e' lateral: 6.6 LV SV:         59 LV SV Index:   28 LVOT Area:     2.27 cm  RIGHT VENTRICLE             IVC RV S prime:     12.80 cm/s  IVC diam: 1.00 cm TAPSE (M-mode): 1.5 cm LEFT ATRIUM             Index       RIGHT ATRIUM           Index LA diam:        3.70 cm 1.72 cm/m  RA Area:     11.40 cm LA Vol (A2C):   34.5 ml 16.06 ml/m RA Volume:   24.80 ml  11.55 ml/m LA Vol (A4C):   34.0 ml 15.83 ml/m LA Biplane Vol: 34.6 ml 16.11 ml/m  AORTIC VALVE LVOT Vmax:   104.00 cm/s LVOT Vmean:  68.400 cm/s LVOT VTI:    0.262 m  AORTA Ao Root diam: 2.70  cm Ao Asc diam:  2.80 cm MITRAL VALVE                TRICUSPID VALVE MV Area (PHT): 3.17 cm     TR Peak grad:   31.6 mmHg MV Decel Time: 239 msec      TR Vmax:        281.00 cm/s MV E velocity: 60.00 cm/s MV A velocity: 102.00 cm/s  SHUNTS MV E/A ratio:  0.59         Systemic VTI:  0.26 m                             Systemic Diam: 1.70 cm Oswaldo Milian MD Electronically signed by Oswaldo Milian MD Signature Date/Time: 08/28/2020/11:41:59 AM    Final    VAS [place] LOWER EXTREMITY VENOUS (DVT)  Result Date: 08/28/2020  Lower Venous DVT Study Patient Name:  ALIXANDREA STANCATO  Date of Exam:   08/28/2020 Medical Rec #: [id number]       Accession #:    [id number] Date of Birth: 23-Oct-1940       Patient Gender: F Patient Age:   13Y Exam Location:  [facility] Procedure:      VAS [place] LOWER EXTREMITY VENOUS (DVT) Referring Phys: [id number] Lequita Halt --------------------------------------------------------------------------------  Indications: Pulmonary embolism.  Limitations: Body habitus. Comparison Study: No prior study on file Performing Technologist: Sharion Dove RVS  Examination Guidelines: A complete evaluation includes B-mode imaging, spectral Doppler, color Doppler, and power Doppler as needed of all accessible portions of each vessel. Bilateral testing is considered an integral part of a complete examination. Limited examinations for reoccurring indications may be performed as noted. The reflux portion of the exam is performed with the patient in reverse Trendelenburg.  +---------+---------------+---------+-----------+----------+--------------+ RIGHT    CompressibilityPhasicitySpontaneityPropertiesThrombus Aging +---------+---------------+---------+-----------+----------+--------------+ CFV      Full           Yes      Yes                                 +---------+---------------+---------+-----------+----------+--------------+ SFJ      Full                                                        +---------+---------------+---------+-----------+----------+--------------+ FV Prox  Full                                                         +---------+---------------+---------+-----------+----------+--------------+ FV Mid   Full                                                        +---------+---------------+---------+-----------+----------+--------------+ FV DistalFull                                                        +---------+---------------+---------+-----------+----------+--------------+  PFV      Full                                                        +---------+---------------+---------+-----------+----------+--------------+ POP      Full                                                        +---------+---------------+---------+-----------+----------+--------------+ PTV      Full                                                        +---------+---------------+---------+-----------+----------+--------------+ PERO     Full                                                        +---------+---------------+---------+-----------+----------+--------------+   +---------+---------------+---------+-----------+----------+--------------+ LEFT     CompressibilityPhasicitySpontaneityPropertiesThrombus Aging +---------+---------------+---------+-----------+----------+--------------+ CFV      Full           Yes      Yes                                 +---------+---------------+---------+-----------+----------+--------------+ SFJ      Full                                                        +---------+---------------+---------+-----------+----------+--------------+ FV Prox  Full                                                        +---------+---------------+---------+-----------+----------+--------------+ FV Mid   Full                                                        +---------+---------------+---------+-----------+----------+--------------+ FV DistalFull                                                         +---------+---------------+---------+-----------+----------+--------------+ PFV      Full                                                        +---------+---------------+---------+-----------+----------+--------------+  POP      Full           Yes      Yes                                 +---------+---------------+---------+-----------+----------+--------------+ PTV      Full                                                        +---------+---------------+---------+-----------+----------+--------------+ PERO     Full                                                        +---------+---------------+---------+-----------+----------+--------------+     Summary: RIGHT: - There is no evidence of deep vein thrombosis in the lower extremity.  LEFT: - There is no evidence of deep vein thrombosis in the lower extremity.  *See table(s) above for measurements and observations. Electronically signed by Jamelle Haring on 08/28/2020 at 10:51:32 AM.    Final     Labs: BNP (last 3 results) No results for input(s): BNP in the last 8760 hours. Basic Metabolic Panel: Recent Labs  Lab 08/26/20 1342 08/28/20 1022 08/28/20 2349  NA 141 137 135  K 4.3 4.1 4.4  CL 106 105 105  CO2 19* 21* 22  GLUCOSE 110* 94 85  BUN 16 12 22   CREATININE 1.04* 0.96 0.97  CALCIUM 9.6 9.4 9.0  MG 1.9  --   --    Liver Function Tests: No results for input(s): AST, ALT, ALKPHOS, BILITOT, PROT, ALBUMIN in the last 168 hours. No results for input(s): LIPASE, AMYLASE in the last 168 hours. No results for input(s): AMMONIA in the last 168 hours. CBC: Recent Labs  Lab 08/27/20 1430 08/28/20 0117 08/28/20 2349  WBC 9.1 9.8 9.2  NEUTROABS 6.0  --   --   HGB 13.0 12.0 11.7*  HCT 40.4 36.8 35.7*  MCV 92.0 91.3 90.4  PLT 229 244 236   Cardiac Enzymes: No results for input(s): CKTOTAL, CKMB, CKMBINDEX, TROPONINI in the last 168 hours. BNP: Invalid input(s): POCBNP CBG: No results for input(s): GLUCAP  in the last 168 hours. D-Dimer Recent Labs    08/26/20 1342  DDIMER 4.01*   Hgb A1c No results for input(s): HGBA1C in the last 72 hours. Lipid Profile No results for input(s): CHOL, HDL, LDLCALC, TRIG, CHOLHDL, LDLDIRECT in the last 72 hours. Thyroid function studies No results for input(s): TSH, T4TOTAL, T3FREE, THYROIDAB in the last 72 hours.  Invalid input(s): FREET3 Anemia work up No results for input(s): VITAMINB12, FOLATE, FERRITIN, TIBC, IRON, RETICCTPCT in the last 72 hours. Urinalysis    Component Value Date/Time   COLORURINE AMBER (A) 06/30/2015 1847   APPEARANCEUR CLOUDY (A) 06/30/2015 1847   LABSPEC 1.025 06/30/2015 1847   PHURINE 5.5 06/30/2015 1847   GLUCOSEU NEGATIVE 06/30/2015 1847   HGBUR NEGATIVE 06/30/2015 1847   BILIRUBINUR SMALL (A) 06/30/2015 1847   KETONESUR NEGATIVE 06/30/2015 1847   PROTEINUR NEGATIVE 06/30/2015 1847   NITRITE NEGATIVE 06/30/2015 1847   LEUKOCYTESUR NEGATIVE 06/30/2015 1847   Sepsis Labs Invalid input(s):  PROCALCITONIN,  WBC,  LACTICIDVEN Microbiology No results found for this or any previous visit (from the past 240 hour(s)).   Time coordinating discharge: 35 minutes  SIGNED: Antonieta Pert, MD  [facility] 08/29/2020, 10:08 AM  If 7PM-7AM, please contact night-coverage [email or web address]

## 2020-08-29 NOTE — TOC Progression Note (Signed)
Transition of Care [facility]) - Progression Note    Patient Details  Name: Kimberly Crosby MRN: [id number] Date of Birth: 1941-04-23  Transition of [facility]) CM/SW Contact  Angelita Ingles, RN Phone Number: [phone]  08/29/2020, 10:40 AM  Clinical Narrative:    [facility] consulted for patient with medication needs. MD requesting co pay info for Xarelto starter pack. CM called  [place]. Patients medication will cost $50. CM at bedside to discuss co pay with patient. Patient states that she is able to afford copay for med. CM has given patient 2 different copay assistance cards to present to the pharmacy for possible assistance. Patient advised to follow up with primary care physician if any questions or concerns  after discharge. No other needs noted at this time. TOC will sign off.         Expected Discharge Plan and Services           Expected Discharge Date: 08/29/20                                     Social Determinants of Health (SDOH) Interventions    Readmission Risk Interventions No flowsheet data found.

## 2020-08-29 NOTE — Progress Notes (Addendum)
[place] for Heparin to xarelto  Indication: pulmonary embolus  No Known Allergies  Patient Measurements: Height: 5\' 3"  (160 cm) Weight: 116.1 kg (256 lb) IBW/kg (Calculated) : 52.4 Heparin Dosing Weight: 80.7kg  Vital Signs: Temp: 98 F (36.7 C) (04/24 0533) Temp Source: Oral (04/24 0533) BP: 127/63 (04/24 0533) Pulse Rate: 60 (04/24 0533)  Labs: Recent Labs     0000 08/26/20 1342 08/27/20 1430 08/27/20 1705 08/28/20 0014 08/28/20 0117 08/28/20 1022 08/28/20 1026 08/28/20 2219 08/28/20 2349 08/29/20 0236  HGB   < >  --  13.0  --   --  12.0  --   --   --  11.7*  --   HCT  --   --  40.4  --   --  36.8  --   --   --  35.7*  --   PLT  --   --  229  --   --  244  --   --   --  236  --   APTT  --   --  31  --   --   --   --   --   --   --   --   HEPARINUNFRC  --   --   --   --    < >  --   --  0.85* 0.54  --  0.48  CREATININE  --  1.04*  --   --   --   --  0.96  --   --  0.97  --   TROPONINIHS  --   --  5 4  --   --   --   --   --   --   --    < > = values in this interval not displayed.    Estimated Creatinine Clearance: 57.8 mL/min (by C-G formula based on SCr of 0.97 mg/dL).   Medical History: Past Medical History:  Diagnosis Date  . Asthma   . Breast cyst   . CHF (congestive heart failure) ([place])   . Edema   . Hypertension   . Osteoporosis 11/2017   T score -3.4 distal third radius    Medications:  Infusions:  . heparin 1,000 Units/hr (08/28/20 1525)    Assessment: 42 yof presented to the ED with a new PE. CBC is pending but has been normal in the past. No AC PTA. No bleeding noted. Heparin level this AM is therapeutic at 0.48. Pharmacy has been consulted by MD to transition to oral AC rivaroxaban.   Goal of Therapy:  Heparin level 0.3-0.7 units/ml Monitor platelets by anticoagulation protocol: Yes   Plan:  Stop heparin gtt Start rivaroxaban 15 MG PO BID for 21 days followed by rivaroxaban 20 MG QD  Monitor  for s/sx of bleed  Monitor renal function   Cephus Slater, PharmD, [facility] Pharmacy Resident [phone] 08/29/2020 8:03 AM

## 2020-08-29 NOTE — Progress Notes (Signed)
Pt ordered to be d/c by provider,  Pt is alert and oriented X4 Pt has been educated on d/c instructions Pt acknowledged understanding

## 2020-09-02 DIAGNOSIS — J219 Acute bronchiolitis, unspecified: Secondary | ICD-10-CM | POA: Diagnosis not present

## 2020-09-02 DIAGNOSIS — I2690 Septic pulmonary embolism without acute cor pulmonale: Secondary | ICD-10-CM | POA: Diagnosis not present

## 2020-09-02 DIAGNOSIS — M130 Polyarthritis, unspecified: Secondary | ICD-10-CM | POA: Diagnosis not present

## 2020-09-02 DIAGNOSIS — I110 Hypertensive heart disease with heart failure: Secondary | ICD-10-CM | POA: Diagnosis not present

## 2020-09-02 DIAGNOSIS — I10 Essential (primary) hypertension: Secondary | ICD-10-CM | POA: Diagnosis not present

## 2020-09-07 DIAGNOSIS — I2690 Septic pulmonary embolism without acute cor pulmonale: Secondary | ICD-10-CM | POA: Diagnosis not present

## 2020-09-07 DIAGNOSIS — I5030 Unspecified diastolic (congestive) heart failure: Secondary | ICD-10-CM | POA: Diagnosis not present

## 2020-09-07 DIAGNOSIS — M130 Polyarthritis, unspecified: Secondary | ICD-10-CM | POA: Diagnosis not present

## 2020-09-07 DIAGNOSIS — J219 Acute bronchiolitis, unspecified: Secondary | ICD-10-CM | POA: Diagnosis not present

## 2020-09-07 DIAGNOSIS — I2699 Other pulmonary embolism without acute cor pulmonale: Secondary | ICD-10-CM | POA: Diagnosis not present

## 2020-09-07 DIAGNOSIS — I110 Hypertensive heart disease with heart failure: Secondary | ICD-10-CM | POA: Diagnosis not present

## 2020-09-07 DIAGNOSIS — J9801 Acute bronchospasm: Secondary | ICD-10-CM | POA: Diagnosis not present

## 2020-09-07 DIAGNOSIS — I10 Essential (primary) hypertension: Secondary | ICD-10-CM | POA: Diagnosis not present

## 2020-09-08 ENCOUNTER — Ambulatory Visit: Payer: Medicare Other

## 2020-09-08 ENCOUNTER — Other Ambulatory Visit: Payer: Self-pay

## 2020-09-08 ENCOUNTER — Ambulatory Visit: Payer: Federal, State, Local not specified - PPO

## 2020-09-08 DIAGNOSIS — I5032 Chronic diastolic (congestive) heart failure: Secondary | ICD-10-CM | POA: Diagnosis not present

## 2020-09-08 DIAGNOSIS — R0609 Other forms of dyspnea: Secondary | ICD-10-CM

## 2020-09-08 DIAGNOSIS — R0600 Dyspnea, unspecified: Secondary | ICD-10-CM

## 2020-09-15 DIAGNOSIS — L501 Idiopathic urticaria: Secondary | ICD-10-CM | POA: Diagnosis not present

## 2020-09-16 ENCOUNTER — Other Ambulatory Visit: Payer: Self-pay

## 2020-09-16 ENCOUNTER — Ambulatory Visit (INDEPENDENT_AMBULATORY_CARE_PROVIDER_SITE_OTHER): Payer: Medicare Other | Admitting: *Deleted

## 2020-09-16 DIAGNOSIS — L501 Idiopathic urticaria: Secondary | ICD-10-CM | POA: Diagnosis not present

## 2020-09-17 ENCOUNTER — Encounter: Payer: Self-pay | Admitting: Cardiology

## 2020-09-17 ENCOUNTER — Ambulatory Visit: Payer: Medicare Other | Admitting: Cardiology

## 2020-09-17 VITALS — BP 119/63 | HR 88 | Temp 97.6°F | Resp 17 | Ht 63.0 in | Wt 256.0 lb

## 2020-09-17 DIAGNOSIS — I2699 Other pulmonary embolism without acute cor pulmonale: Secondary | ICD-10-CM

## 2020-09-17 DIAGNOSIS — Z87891 Personal history of nicotine dependence: Secondary | ICD-10-CM

## 2020-09-17 DIAGNOSIS — I10 Essential (primary) hypertension: Secondary | ICD-10-CM | POA: Diagnosis not present

## 2020-09-17 DIAGNOSIS — I5032 Chronic diastolic (congestive) heart failure: Secondary | ICD-10-CM | POA: Diagnosis not present

## 2020-09-17 DIAGNOSIS — Z6841 Body Mass Index (BMI) 40.0 and over, adult: Secondary | ICD-10-CM | POA: Diagnosis not present

## 2020-09-17 DIAGNOSIS — R0609 Other forms of dyspnea: Secondary | ICD-10-CM

## 2020-09-17 MED ORDER — METOPROLOL SUCCINATE ER 25 MG PO TB24: 25.0000 mg | ORAL_TABLET | Freq: Every day | ORAL | 0 refills | Status: DC

## 2020-09-17 NOTE — Progress Notes (Signed)
Kimberly Crosby Date of Birth: Jan 14, 1941 MRN: [id number] Primary Care Provider:Bland, Myra Rude, MD Former Cardiology Providers: Dr. Adrian Prows, Jeri Lager, APRN, FNP-C Primary Cardiologist: Rex Kras, DO, [facility] (established care 02/26/2020)  Date: 09/17/20 Last Office Visit: 08/26/2020  Chief Complaint  Patient presents with  . Dyspnea on exertion  . Results    Lab   . Follow-up    4 week    HPI  Kimberly Crosby is a 80 y.o.  female who presents to the office with a chief complaint of " shortness of breath and after hospitalization." Patient's past medical history and cardiovascular risk factors include: Hypertension, obesity, chronic diastolic heart failure, Former smoker, advanced age, postmenopausal female.  Patient presented to the office last month for her annual follow-up visit for heart failure management.  However, at that time she was profoundly dyspneic even with minimal conversation.  On physical examination she noted to be overall euvolemic but I was suspicious for pulmonary embolism.  Patient had a D-dimer checked which was elevated and subsequently had a CT PE protocol which was positive for right-sided pulmonary embolism.  Results/impression as noted below.  Given her symptoms, hemodynamics, and the extensive nature of her pulmonary embolism she was referred to hospital for further evaluation and management.  Patient stayed in the hospital for several days received parenteral heparin and her shortness of breath improved.  She was transitioned to Xarelto and now presents for follow-up.  Patient states that since hospitalization her shortness of breath has improved significantly.  She is able to carry on a conversation and ambulate without any significant shortness of breath.  She does have this dry and hacking cough which is chronic and stable.   FUNCTIONAL STATUS: She walks 3 days a week each time walking about 3 mile.    ALLERGIES: No Known Allergies   MEDICATION  LIST PRIOR TO VISIT: Current Outpatient Medications on File Prior to Visit  Medication Sig Dispense Refill  . betamethasone dipropionate (DIPROLENE) 0.05 % ointment Apply 1 application topically daily.    . fexofenadine (ALLEGRA) 180 MG tablet Take 1 tablet (180 mg total) by mouth daily. (Patient taking differently: Take 180 mg by mouth as needed for allergies.) 30 tablet 5  . latanoprost (XALATAN) 0.005 % ophthalmic solution Place 1 drop into both eyes at bedtime.    Marland Kitchen omalizumab Arvid Right) 150 MG/ML prefilled syringe Inject 150 mg into the skin every 28 (twenty-eight) days.    . Omega-3 Fatty Acids (FISH OIL PO) Take 1 tablet by mouth daily.    Marland Kitchen Propylene Glycol (SYSTANE BALANCE OP) Apply 1 drop to eye daily.    Marland Kitchen RIVAROXABAN (XARELTO) VTE STARTER PACK (15 & 20 MG) Follow package directions: Take one 15mg  tablet by mouth twice a day. On day 22, switch to one 20mg  tablet once a day. Take with food. 51 each 0  . sacubitril-valsartan (ENTRESTO) 97-103 MG Take 1 tablet by mouth daily.     Marland Kitchen spironolactone-hydrochlorothiazide (ALDACTAZIDE) 25-25 MG tablet TAKE 1 TABLET BY MOUTH DAILY 90 tablet 2  . triamcinolone (KENALOG) 0.025 % ointment Apply 1 application topically 2 (two) times daily.     Current Facility-Administered Medications on File Prior to Visit  Medication Dose Route Frequency Provider Last Rate Last Admin  . omalizumab Arvid Right) injection 300 mg  300 mg Subcutaneous Q28 days Kennith Gain, MD   300 mg at 09/16/20 1115    PAST MEDICAL HISTORY: Past Medical History:  Diagnosis Date  . Asthma   .  Breast cyst   . CHF (congestive heart failure) ([place])   . Edema   . Hypertension   . Osteoporosis 11/2017   T score -3.4 distal third radius    PAST SURGICAL HISTORY: Past Surgical History:  Procedure Laterality Date  . BREAST EXCISIONAL BIOPSY Left   . BREAST SURGERY     biopsy-cyst    FAMILY HISTORY: The patient's family history includes Breast cancer (age of onset:  53) in her mother; Diabetes in her brother; Heart disease (age of onset: 14) in her father.   SOCIAL HISTORY:  The patient  reports that she quit smoking about 26 years ago. Her smoking use included cigarettes. She has a 0.75 pack-year smoking history. She has never used smokeless tobacco. She reports current alcohol use. She reports that she does not use drugs.  Review of Systems  Constitutional: Positive for malaise/fatigue and weight loss. Negative for chills and fever.  HENT: Negative for hoarse voice and nosebleeds.        Hard of hearing   Eyes: Negative for discharge, double vision and pain.  Cardiovascular: Negative for chest pain, claudication, dyspnea on exertion, leg swelling, near-syncope, orthopnea, palpitations, paroxysmal nocturnal dyspnea and syncope.  Respiratory: Negative for hemoptysis and shortness of breath.   Musculoskeletal: Negative for muscle cramps and myalgias.  Gastrointestinal: Negative for abdominal pain, constipation, diarrhea, hematemesis, hematochezia, melena, nausea and vomiting.  Neurological: Negative for dizziness and light-headedness.    PHYSICAL EXAM: Vitals with BMI 09/17/2020 08/29/2020 08/28/2020  Height 5\' 3"  - -  Weight 256 lbs - -  BMI 45.80 - -  Systolic 998 338 250  Diastolic 63 63 85  Pulse 88 60 82    CONSTITUTIONAL: Well-developed and well-nourished. No acute distress.  SKIN: Skin is warm and dry. No rash noted. No cyanosis. No pallor. No jaundice HEAD: Normocephalic and atraumatic.  EYES: No scleral icterus MOUTH/THROAT: Moist oral membranes.  NECK: No JVD present. No thyromegaly noted. No carotid bruits  LYMPHATIC: No visible cervical adenopathy.  CHEST Normal respiratory effort. No intercostal retractions  LUNGS: Clear to auscultation bilaterally. No stridor. No wheezes. No rales.  CARDIOVASCULAR: Regular rate and rhythm, positive S1-S2, no murmurs rubs or gallops appreciated. ABDOMINAL: Obese, soft, nontender, nondistended,  positive bowel sounds all 4 quadrants. No apparent ascites.  EXTREMITIES: No peripheral edema  HEMATOLOGIC: No significant bruising NEUROLOGIC: Oriented to person, place, and time. Nonfocal. Normal muscle tone.  PSYCHIATRIC: Normal mood and affect. Normal behavior. Cooperative  CTA PE Protocol  08/27/2020: 1. Large right-sided pulmonary embolism extending from the right main pulmonary artery into lobar, segmental and subsegmental sized branches in the right lung. 2. Mild cardiomegaly. 3. Aortic atherosclerosis.  CARDIAC DATABASE: EKG: 08/26/2020: Sinus  Rhythm, 80bpm, without underlying ischemia or injury pattern.  09/17/2020: Sinus  Rhythm, 80bpm, without underlying injury pattern.  Echocardiogram: 08/28/2020: 1. Left ventricular ejection fraction, by estimation, is 65 to 70%. The left ventricle has normal function. The left ventricle has no regional wall motion abnormalities. There is mild left ventricular hypertrophy. Left ventricular diastolic parameters  are consistent with Grade I diastolic dysfunction (impaired relaxation).  2. Right ventricular systolic function is normal. The right ventricular  size is mildly enlarged. There is mildly elevated pulmonary artery systolic pressure. The estimated right ventricular systolic pressure is  53.9 mmHg.  3. The mitral valve is normal in structure. No evidence of mitral valve regurgitation.  4. The aortic valve is tricuspid. Aortic valve regurgitation is not visualized. No aortic stenosis is present.  5. The inferior vena cava is normal in size with greater than 50% respiratory variability, suggesting right atrial pressure of 3 mmHg.  Stress Testing:  Lexiscan Sestamibi stress test 09/08/2020: 1 Day Rest/Stress Protocol. Stress EKG is non-diagnostic, as this is pharmacological stress test using Lexiscan. Normal myocardial perfusion without evidence of reversible ischemia or prior infarct. Left ventricular size normal. Left ventricular  wall thickness is preserved without regional wall motion abnormalities, calculated LVEF 55%. Compared to prior study 07/27/2017: No significant change.  Low risk study.   Heart Catheterization: None  LABORATORY DATA: CBC Latest Ref Rng & Units 08/28/2020 08/28/2020 08/27/2020  WBC 4.0 - 10.5 K/uL 9.2 9.8 9.1  Hemoglobin 12.0 - 15.0 g/dL 11.7(L) 12.0 13.0  Hematocrit 36.0 - 46.0 % 35.7(L) 36.8 40.4  Platelets 150 - 400 K/uL 236 244 229    CMP Latest Ref Rng & Units 08/28/2020 08/28/2020 08/26/2020  Glucose 70 - 99 mg/dL 85 94 110(H)  BUN 8 - 23 mg/dL 22 12 16   Creatinine 0.44 - 1.00 mg/dL 0.97 0.96 1.04(H)  Sodium 135 - 145 mmol/L 135 137 141  Potassium 3.5 - 5.1 mmol/L 4.4 4.1 4.3  Chloride 98 - 111 mmol/L 105 105 106  CO2 22 - 32 mmol/L 22 21(L) 19(L)  Calcium 8.9 - 10.3 mg/dL 9.0 9.4 9.6  Total Protein 6.5 - 8.1 g/dL - - -  Total Bilirubin 0.3 - 1.2 mg/dL - - -  Alkaline Phos 38 - 126 U/L - - -  AST 15 - 41 U/L - - -  ALT 14 - 54 U/L - - -    Lipid Panel  No results found for: CHOL, TRIG, HDL, CHOLHDL, VLDL, LDLCALC, LDLDIRECT, LABVLDL  No results found for: HGBA1C No components found for: NTPROBNP No results found for: TSH  Cardiac Panel (last 3 results) No results for input(s): CKTOTAL, CKMB, TROPONINIHS, RELINDX in the last 72 hours.  IMPRESSION:    ICD-10-CM   1. Dyspnea on exertion  R06.00   2. Acute PE right sided.   I26.99   3. Essential hypertension  I10 EKG 12-Lead  4. Chronic heart failure with preserved ejection fraction (HFpEF) (HCC)  I50.32 metoprolol succinate (TOPROL XL) 25 MG 24 hr tablet  5. Chronic diastolic (congestive) heart failure (HCC)  I50.32   6. Former smoker  Z87.891   79. Class 3 severe obesity due to excess calories with serious comorbidity and body mass index (BMI) of 45.0 to 49.9 in adult [facility])  E66.01    Z68.42      RECOMMENDATIONS: BELLA BRUMMET is a 80 y.o. female whose past medical history and cardiovascular risk factors include:  Hypertension, obesity, chronic diastolic heart failure, Former smoker, advanced age, postmenopausal female.  Dyspnea on exertion: Improved. Since last office visit patient was diagnosed with acute right-sided pulmonary embolism and was hospitalized for parenteral heparin and then transitioned to Xarelto. Patient remains euvolemic and not in overt congestive heart failure. Medications reconciled. Continue to monitor.  Chronic heart failure with preserved EF, stage C, NYHA class II:  Medications reconciled.  Started on Toprol-XL 25 mg p.o. daily, patient stopped secondary to hives.  Later when evaluated by dermatology she was informed that the rash was not medication induced.  Patient is willing to restart Toprol-XL.  Benign essential hypertension:  Office blood pressure is well controlled.  Medications reconciled.  Low-salt diet recommended.  Strict I's and O's and daily weights.  Former smoker: Educated on the importance of continued smoking cessation.  FINAL  MEDICATION LIST END OF ENCOUNTER: Meds ordered this encounter  Medications  . metoprolol succinate (TOPROL XL) 25 MG 24 hr tablet    Sig: Take 1 tablet (25 mg total) by mouth daily.    Dispense:  90 tablet    Refill:  0     Current Outpatient Medications:  .  betamethasone dipropionate (DIPROLENE) 0.05 % ointment, Apply 1 application topically daily., Disp: , Rfl:  .  fexofenadine (ALLEGRA) 180 MG tablet, Take 1 tablet (180 mg total) by mouth daily. (Patient taking differently: Take 180 mg by mouth as needed for allergies.), Disp: 30 tablet, Rfl: 5 .  latanoprost (XALATAN) 0.005 % ophthalmic solution, Place 1 drop into both eyes at bedtime., Disp: , Rfl:  .  metoprolol succinate (TOPROL XL) 25 MG 24 hr tablet, Take 1 tablet (25 mg total) by mouth daily., Disp: 90 tablet, Rfl: 0 .  omalizumab (XOLAIR) 150 MG/ML prefilled syringe, Inject 150 mg into the skin every 28 (twenty-eight) days., Disp: , Rfl:  .  Omega-3 Fatty  Acids (FISH OIL PO), Take 1 tablet by mouth daily., Disp: , Rfl:  .  Propylene Glycol (SYSTANE BALANCE OP), Apply 1 drop to eye daily., Disp: , Rfl:  .  RIVAROXABAN (XARELTO) VTE STARTER PACK (15 & 20 MG), Follow package directions: Take one 15mg  tablet by mouth twice a day. On day 22, switch to one 20mg  tablet once a day. Take with food., Disp: 51 each, Rfl: 0 .  sacubitril-valsartan (ENTRESTO) 97-103 MG, Take 1 tablet by mouth daily. , Disp: , Rfl:  .  spironolactone-hydrochlorothiazide (ALDACTAZIDE) 25-25 MG tablet, TAKE 1 TABLET BY MOUTH DAILY, Disp: 90 tablet, Rfl: 2 .  triamcinolone (KENALOG) 0.025 % ointment, Apply 1 application topically 2 (two) times daily., Disp: , Rfl:   Current Facility-Administered Medications:  .  omalizumab Arvid Right) injection 300 mg, 300 mg, Subcutaneous, Q28 days, Kennith Gain, MD, 300 mg at 09/16/20 1115  Orders Placed This Encounter  Procedures  . EKG 12-Lead   --Continue cardiac medications as reconciled in final medication list. --Return in about 6 months (around 03/20/2021) for Follow up, heart failure management.. Or sooner if needed. --Continue follow-up with your primary care physician regarding the management of your other chronic comorbid conditions.  Patient's questions and concerns were addressed to her satisfaction. She voices understanding of the instructions provided during this encounter.   This note was created using a voice recognition software as a result there may be grammatical errors inadvertently enclosed that do not reflect the nature of this encounter. Every attempt is made to correct such errors.  Rex Kras, [place]  Pager: [phone] Office: [phone]

## 2020-09-21 DIAGNOSIS — J9801 Acute bronchospasm: Secondary | ICD-10-CM | POA: Diagnosis not present

## 2020-09-21 DIAGNOSIS — I10 Essential (primary) hypertension: Secondary | ICD-10-CM | POA: Diagnosis not present

## 2020-09-21 DIAGNOSIS — I2699 Other pulmonary embolism without acute cor pulmonale: Secondary | ICD-10-CM | POA: Diagnosis not present

## 2020-10-05 DIAGNOSIS — I10 Essential (primary) hypertension: Secondary | ICD-10-CM | POA: Diagnosis not present

## 2020-10-05 DIAGNOSIS — J9801 Acute bronchospasm: Secondary | ICD-10-CM | POA: Diagnosis not present

## 2020-10-13 DIAGNOSIS — L501 Idiopathic urticaria: Secondary | ICD-10-CM | POA: Diagnosis not present

## 2020-10-14 ENCOUNTER — Ambulatory Visit (INDEPENDENT_AMBULATORY_CARE_PROVIDER_SITE_OTHER): Payer: Medicare Other | Admitting: *Deleted

## 2020-10-14 ENCOUNTER — Other Ambulatory Visit: Payer: Self-pay

## 2020-10-14 DIAGNOSIS — L501 Idiopathic urticaria: Secondary | ICD-10-CM | POA: Diagnosis not present

## 2020-10-28 ENCOUNTER — Other Ambulatory Visit: Payer: Self-pay | Admitting: Internal Medicine

## 2020-10-28 ENCOUNTER — Other Ambulatory Visit: Payer: Self-pay | Admitting: Obstetrics and Gynecology

## 2020-10-28 DIAGNOSIS — Z1231 Encounter for screening mammogram for malignant neoplasm of breast: Secondary | ICD-10-CM

## 2020-11-10 DIAGNOSIS — L501 Idiopathic urticaria: Secondary | ICD-10-CM | POA: Diagnosis not present

## 2020-11-11 ENCOUNTER — Other Ambulatory Visit: Payer: Self-pay

## 2020-11-11 ENCOUNTER — Ambulatory Visit (INDEPENDENT_AMBULATORY_CARE_PROVIDER_SITE_OTHER): Payer: Medicare Other | Admitting: *Deleted

## 2020-11-11 DIAGNOSIS — L501 Idiopathic urticaria: Secondary | ICD-10-CM

## 2020-11-16 DIAGNOSIS — R351 Nocturia: Secondary | ICD-10-CM | POA: Diagnosis not present

## 2020-11-16 DIAGNOSIS — I5030 Unspecified diastolic (congestive) heart failure: Secondary | ICD-10-CM | POA: Diagnosis not present

## 2020-11-16 DIAGNOSIS — E6609 Other obesity due to excess calories: Secondary | ICD-10-CM | POA: Diagnosis not present

## 2020-11-16 DIAGNOSIS — I110 Hypertensive heart disease with heart failure: Secondary | ICD-10-CM | POA: Diagnosis not present

## 2020-11-16 DIAGNOSIS — F5101 Primary insomnia: Secondary | ICD-10-CM | POA: Diagnosis not present

## 2020-11-16 DIAGNOSIS — Z72820 Sleep deprivation: Secondary | ICD-10-CM | POA: Diagnosis not present

## 2020-11-16 DIAGNOSIS — Z6841 Body Mass Index (BMI) 40.0 and over, adult: Secondary | ICD-10-CM | POA: Diagnosis not present

## 2020-11-24 DIAGNOSIS — H401122 Primary open-angle glaucoma, left eye, moderate stage: Secondary | ICD-10-CM | POA: Diagnosis not present

## 2020-12-08 DIAGNOSIS — L501 Idiopathic urticaria: Secondary | ICD-10-CM | POA: Diagnosis not present

## 2020-12-09 ENCOUNTER — Ambulatory Visit (INDEPENDENT_AMBULATORY_CARE_PROVIDER_SITE_OTHER): Payer: Medicare Other

## 2020-12-09 ENCOUNTER — Other Ambulatory Visit: Payer: Self-pay

## 2020-12-09 DIAGNOSIS — L501 Idiopathic urticaria: Secondary | ICD-10-CM | POA: Diagnosis not present

## 2020-12-19 DIAGNOSIS — G4733 Obstructive sleep apnea (adult) (pediatric): Secondary | ICD-10-CM | POA: Diagnosis not present

## 2020-12-21 ENCOUNTER — Other Ambulatory Visit: Payer: Self-pay

## 2020-12-21 ENCOUNTER — Ambulatory Visit
Admission: RE | Admit: 2020-12-21 | Discharge: 2020-12-21 | Disposition: A | Discharge status: Home / Self Care | Payer: Medicare Other | Source: Ambulatory Visit | Attending: Obstetrics and Gynecology | Admitting: Obstetrics and Gynecology

## 2020-12-21 DIAGNOSIS — Z1231 Encounter for screening mammogram for malignant neoplasm of breast: Secondary | ICD-10-CM

## 2020-12-24 ENCOUNTER — Other Ambulatory Visit: Payer: Self-pay | Admitting: Cardiology

## 2020-12-24 DIAGNOSIS — I5032 Chronic diastolic (congestive) heart failure: Secondary | ICD-10-CM

## 2021-01-05 DIAGNOSIS — L501 Idiopathic urticaria: Secondary | ICD-10-CM | POA: Diagnosis not present

## 2021-01-06 ENCOUNTER — Other Ambulatory Visit: Payer: Self-pay

## 2021-01-06 ENCOUNTER — Ambulatory Visit (INDEPENDENT_AMBULATORY_CARE_PROVIDER_SITE_OTHER): Payer: Medicare Other | Admitting: *Deleted

## 2021-01-06 DIAGNOSIS — L501 Idiopathic urticaria: Secondary | ICD-10-CM | POA: Diagnosis not present

## 2021-01-14 ENCOUNTER — Encounter: Payer: Federal, State, Local not specified - PPO | Admitting: Obstetrics and Gynecology

## 2021-01-14 ENCOUNTER — Ambulatory Visit: Payer: Federal, State, Local not specified - PPO | Admitting: Nurse Practitioner

## 2021-01-17 ENCOUNTER — Other Ambulatory Visit: Payer: Self-pay

## 2021-01-17 ENCOUNTER — Ambulatory Visit (INDEPENDENT_AMBULATORY_CARE_PROVIDER_SITE_OTHER): Payer: Medicare Other | Admitting: Obstetrics & Gynecology

## 2021-01-17 ENCOUNTER — Encounter: Payer: Self-pay | Admitting: Obstetrics & Gynecology

## 2021-01-17 VITALS — BP 122/78 | HR 76 | Resp 16 | Wt 256.0 lb

## 2021-01-17 DIAGNOSIS — Z780 Asymptomatic menopausal state: Secondary | ICD-10-CM | POA: Diagnosis not present

## 2021-01-17 DIAGNOSIS — R1032 Left lower quadrant pain: Secondary | ICD-10-CM | POA: Diagnosis not present

## 2021-01-17 DIAGNOSIS — Z01419 Encounter for gynecological examination (general) (routine) without abnormal findings: Secondary | ICD-10-CM | POA: Diagnosis not present

## 2021-01-17 DIAGNOSIS — M810 Age-related osteoporosis without current pathological fracture: Secondary | ICD-10-CM | POA: Diagnosis not present

## 2021-01-17 NOTE — Progress Notes (Signed)
Kimberly Crosby 09/22/1940 [id number]   History:    80 y.o. [id number]  RP:  Established patient presenting for annual gyn exam   HPI: Postmenopausal.  No vaginal bleeding.  No hot flashes or night sweats.  No PMB.  C/O mild intermittent LLQ pain, which have been present for many years.  History of left ovarian cyst and echogenic focus in the endometrium.  First noted on the 2012 ultrasound.  Follow-up ultrasound 5 years later 2017 showed avascular cystic area 30 mm on the left side with endometrial thickness 5.8 mm and a sonohysterogram attempt was not successful at that time so no sampling was done.  There was noted to be a 10 mm endometrial defect on follow-up ultrasound.  Her last pelvic ultrasound on 03/27/2017 showing a left adnexal cyst 37 mm with negative flow, endometrial thickness still 10 mm, stable.  CA-125 was normal.  She has had discussion with Dr. Loetta Crosby regarding possibility of hysteroscopy D&C versus serial ultrasound versus surveillance for symptoms, and she opted for the latter.  Pap smear Neg in 2017.  Breasts normal. Screening mammo Neg 12/2020.  [place] 2021.  BD 03/2020 stable with Osteoporosis only at the Lt Forearm.  Past medical history,surgical history, family history and social history were all reviewed and documented in the EPIC chart.  Gynecologic History No LMP recorded. Patient is postmenopausal.  Obstetric History OB History  Gravida Para Term Preterm AB Living  '4 3 3   1 3  '$ SAB IAB Ectopic Multiple Live Births               # Outcome Date GA Lbr Len/2nd Weight Sex Delivery Anes PTL Lv  4 AB           3 Term           2 Term           1 Term              ROS: A ROS was performed and pertinent positives and negatives are included in the history.  GENERAL: No fevers or chills. HEENT: No change in vision, no earache, sore throat or sinus congestion. NECK: No pain or stiffness. CARDIOVASCULAR: No chest pain or pressure. No palpitations. PULMONARY: No shortness  of breath, cough or wheeze. GASTROINTESTINAL: No abdominal pain, nausea, vomiting or diarrhea, melena or bright red blood per rectum. GENITOURINARY: No urinary frequency, urgency, hesitancy or dysuria. MUSCULOSKELETAL: No joint or muscle pain, no back pain, no recent trauma. DERMATOLOGIC: No rash, no itching, no lesions. ENDOCRINE: No polyuria, polydipsia, no heat or cold intolerance. No recent change in weight. HEMATOLOGICAL: No anemia or easy bruising or bleeding. NEUROLOGIC: No headache, seizures, numbness, tingling or weakness. PSYCHIATRIC: No depression, no loss of interest in normal activity or change in sleep pattern.     Exam:   BP 122/78   Pulse 76   Resp 16   Wt 256 lb (116.1 kg)   BMI 45.35 kg/m   Body mass index is 45.35 kg/m.  General appearance : Well developed well nourished female. No acute distress HEENT: Eyes: no retinal hemorrhage or exudates,  Neck supple, trachea midline, no carotid bruits, no thyroidmegaly Lungs: Clear to auscultation, no rhonchi or wheezes, or rib retractions  Heart: Regular rate and rhythm, no murmurs or gallops Breast:Examined in sitting and supine position were symmetrical in appearance, no palpable masses or tenderness,  no skin retraction, no nipple inversion, no nipple discharge, no skin discoloration, no axillary or  supraclavicular lymphadenopathy Abdomen: no palpable masses or tenderness, no rebound or guarding Extremities: no edema or skin discoloration or tenderness  Pelvic: Vulva: Normal             Vagina: No gross lesions or discharge  Cervix: No gross lesions or discharge  Uterus  AV, normal size, shape and consistency, non-tender and mobile  Adnexa  Without masses or tenderness  Anus: Normal   Assessment/Plan:  80 y.o. female for annual exam   1. Encounter for gynecological examination without abnormal finding Normal gynecologic exam in postmenopausal.  No indication to repeat a Pap test at this time.  Breast exam normal.   Screening mammogram I am was negative in August 2022.  Colonoscopy in 2021.  Body mass index 45.35.  2. Postmenopause Well on no hormone replacement therapy.  No postmenopausal bleeding.  3. Postmenopausal osteoporosis Bone density November 2021 was stable with osteoporosis only at the left forearm.  4. Intermittent left lower quadrant abdominal pain Continued intermittent left lower quadrant abdominal pain.  History of a small left ovarian cyst and thickening of the endometrium.  Decision to proceed with a pelvic ultrasound at follow-up. - US Transvaginal Non-OB; Future  Other orders - XARELTO 20 MG TABS tablet; Take 20 mg by mouth daily.   Kimberly Bruins MD, 11:17 AM 01/17/2021

## 2021-02-02 DIAGNOSIS — L501 Idiopathic urticaria: Secondary | ICD-10-CM | POA: Diagnosis not present

## 2021-02-03 ENCOUNTER — Other Ambulatory Visit: Payer: Self-pay

## 2021-02-03 ENCOUNTER — Ambulatory Visit (INDEPENDENT_AMBULATORY_CARE_PROVIDER_SITE_OTHER): Payer: Medicare Other

## 2021-02-03 DIAGNOSIS — L501 Idiopathic urticaria: Secondary | ICD-10-CM

## 2021-02-21 DIAGNOSIS — H401111 Primary open-angle glaucoma, right eye, mild stage: Secondary | ICD-10-CM | POA: Diagnosis not present

## 2021-02-21 DIAGNOSIS — H401122 Primary open-angle glaucoma, left eye, moderate stage: Secondary | ICD-10-CM | POA: Diagnosis not present

## 2021-02-24 ENCOUNTER — Other Ambulatory Visit: Payer: Medicare Other | Admitting: Obstetrics & Gynecology

## 2021-02-24 ENCOUNTER — Other Ambulatory Visit: Payer: Federal, State, Local not specified - PPO

## 2021-03-01 DIAGNOSIS — E6609 Other obesity due to excess calories: Secondary | ICD-10-CM | POA: Diagnosis not present

## 2021-03-01 DIAGNOSIS — E785 Hyperlipidemia, unspecified: Secondary | ICD-10-CM | POA: Diagnosis not present

## 2021-03-01 DIAGNOSIS — R7309 Other abnormal glucose: Secondary | ICD-10-CM | POA: Diagnosis not present

## 2021-03-01 DIAGNOSIS — I10 Essential (primary) hypertension: Secondary | ICD-10-CM | POA: Diagnosis not present

## 2021-03-01 DIAGNOSIS — I5030 Unspecified diastolic (congestive) heart failure: Secondary | ICD-10-CM | POA: Diagnosis not present

## 2021-03-02 DIAGNOSIS — L501 Idiopathic urticaria: Secondary | ICD-10-CM | POA: Diagnosis not present

## 2021-03-03 ENCOUNTER — Ambulatory Visit (INDEPENDENT_AMBULATORY_CARE_PROVIDER_SITE_OTHER): Payer: Medicare Other | Admitting: *Deleted

## 2021-03-03 ENCOUNTER — Other Ambulatory Visit: Payer: Self-pay

## 2021-03-03 DIAGNOSIS — I10 Essential (primary) hypertension: Secondary | ICD-10-CM | POA: Diagnosis not present

## 2021-03-03 DIAGNOSIS — L501 Idiopathic urticaria: Secondary | ICD-10-CM | POA: Diagnosis not present

## 2021-03-13 ENCOUNTER — Other Ambulatory Visit: Payer: Self-pay | Admitting: Cardiology

## 2021-03-13 DIAGNOSIS — I5032 Chronic diastolic (congestive) heart failure: Secondary | ICD-10-CM

## 2021-03-21 ENCOUNTER — Encounter: Payer: Self-pay | Admitting: Cardiology

## 2021-03-21 ENCOUNTER — Other Ambulatory Visit: Payer: Self-pay

## 2021-03-21 ENCOUNTER — Ambulatory Visit: Payer: Medicare Other | Admitting: Cardiology

## 2021-03-21 VITALS — BP 114/73 | HR 65 | Temp 98.0°F | Resp 16 | Ht 63.0 in | Wt 258.0 lb

## 2021-03-21 DIAGNOSIS — I10 Essential (primary) hypertension: Secondary | ICD-10-CM

## 2021-03-21 DIAGNOSIS — I5032 Chronic diastolic (congestive) heart failure: Secondary | ICD-10-CM

## 2021-03-21 DIAGNOSIS — Z86711 Personal history of pulmonary embolism: Secondary | ICD-10-CM

## 2021-03-21 NOTE — Progress Notes (Signed)
[name] Date of Birth: 1940/06/03 MRN: [id number] Primary Care Provider:Bland, Myra Rude, MD Former Cardiology Providers: Dr. Adrian Prows, Jeri Lager, APRN, FNP-C Primary Cardiologist: Rex Kras, DO, [facility] (established care 02/26/2020)  Date: 03/21/21 Last Office Visit: 09/17/2020   Chief Complaint  Patient presents with   Shortness of Breath   Congestive Heart Failure   Follow-up    HPI  TARAH BUBOLTZ is a 80 y.o.  female who presents to the office with a chief complaint of " 108-month follow-up for heart failure management." Patient's past medical history and cardiovascular risk factors include: Hx of Large right-sided pulmonary embolism extending from the right main pulmonary artery into lobar, segmental and subsegmental sized branches in the right lung (08/27/2020), aortic atherosclerosis, hypertension, obesity, chronic diastolic heart failure, Former smoker, advanced age, postmenopausal female.  Patient follows up 6 months for heart failure management.  During her prior office visits patient was noted to be extremely dyspneic with minimal conversation.  Work-up noted right-sided pulmonary embolism for which she was hospitalized and was started on IV heparin drip and later transitioned to Xarelto.  Patient states that her blood thinners are currently being managed by primary team.  I have asked her to discuss it further with her PCP with regards to the duration of oral anticoagulation as she is already completed 6 months of therapy and this being the first episode.  From a heart failure standpoint she is doing well since last office visit she denies any hospitalizations or urgent care visits.  She denies orthopnea, paroxysmal nocturnal dyspnea or lower extremity swelling.  She has gained approximately 2 pounds in the last office visit due to dietary indiscretion.  She is compliant on current medical therapy.  FUNCTIONAL STATUS: She walks 3 days a week each time walking about 3  mile.    ALLERGIES: No Known Allergies   MEDICATION LIST PRIOR TO VISIT: Current Outpatient Medications on File Prior to Visit  Medication Sig Dispense Refill   fexofenadine (ALLEGRA) 180 MG tablet Take 1 tablet (180 mg total) by mouth daily. (Patient taking differently: Take 180 mg by mouth as needed for allergies.) 30 tablet 5   latanoprost (XALATAN) 0.005 % ophthalmic solution Place 1 drop into both eyes at bedtime.     metoprolol succinate (TOPROL-XL) 25 MG 24 hr tablet TAKE 1 TABLET(25 MG) BY MOUTH DAILY 90 tablet 0   Omega-3 Fatty Acids (FISH OIL PO) Take 1 tablet by mouth daily.     Propylene Glycol (SYSTANE BALANCE OP) Apply 1 drop to eye daily.     sacubitril-valsartan (ENTRESTO) 97-103 MG Take 1 tablet by mouth daily.      spironolactone-hydrochlorothiazide (ALDACTAZIDE) 25-25 MG tablet TAKE 1 TABLET BY MOUTH DAILY 90 tablet 2   XARELTO 20 MG TABS tablet Take 20 mg by mouth daily.     Current Facility-Administered Medications on File Prior to Visit  Medication Dose Route Frequency Provider Last Rate Last Admin   omalizumab Arvid Right) injection 300 mg  300 mg Subcutaneous Q28 days Kennith Gain, MD   300 mg at 03/03/21 1107    PAST MEDICAL HISTORY: Past Medical History:  Diagnosis Date   Asthma    Breast cyst    CHF (congestive heart failure) ([place])    Edema    Hypertension    Osteoporosis 11/2017   T score -3.4 distal third radius   Pulmonary embolism (HCC)     PAST SURGICAL HISTORY: Past Surgical History:  Procedure Laterality Date   BREAST  EXCISIONAL BIOPSY Left    BREAST SURGERY     biopsy-cyst    FAMILY HISTORY: The patient's family history includes Breast cancer (age of onset: 50) in her mother; Diabetes in her brother; Heart disease (age of onset: 84) in her father.   SOCIAL HISTORY:  The patient  reports that she quit smoking about 27 years ago. Her smoking use included cigarettes. She has a 0.75 pack-year smoking history. She has never used  smokeless tobacco. She reports current alcohol use. She reports that she does not use drugs.  Review of Systems  Constitutional: Negative for chills, fever and malaise/fatigue.  HENT:  Negative for hoarse voice and nosebleeds.        Hard of hearing   Eyes:  Negative for discharge, double vision and pain.  Cardiovascular:  Negative for chest pain, claudication, dyspnea on exertion, leg swelling, near-syncope, orthopnea, palpitations, paroxysmal nocturnal dyspnea and syncope.  Respiratory:  Negative for hemoptysis and shortness of breath.   Musculoskeletal:  Negative for muscle cramps and myalgias.  Gastrointestinal:  Negative for abdominal pain, constipation, diarrhea, hematemesis, hematochezia, melena, nausea and vomiting.  Neurological:  Negative for dizziness and light-headedness.   PHYSICAL EXAM: Vitals with BMI 03/21/2021 01/17/2021 09/17/2020  Height 5\' 3"  (No Data) 5\' 3"   Weight 258 lbs 256 lbs 256 lbs  BMI 09.73 - 53.29  Systolic 924 268 341  Diastolic 73 78 63  Pulse 65 76 88    CONSTITUTIONAL: Well-developed and well-nourished. No acute distress.  SKIN: Skin is warm and dry. No rash noted. No cyanosis. No pallor. No jaundice HEAD: Normocephalic and atraumatic.  EYES: No scleral icterus MOUTH/THROAT: Moist oral membranes.  NECK: No JVD present. No thyromegaly noted. No carotid bruits  LYMPHATIC: No visible cervical adenopathy.  CHEST Normal respiratory effort. No intercostal retractions  LUNGS: Clear to auscultation bilaterally. No stridor. No wheezes. No rales.  CARDIOVASCULAR: Regular rate and rhythm, positive S1-S2, no murmurs rubs or gallops appreciated. ABDOMINAL: Obese, soft, nontender, nondistended, positive bowel sounds all 4 quadrants. No apparent ascites.  EXTREMITIES: No peripheral edema, warm to touch, palpable DP and PT pulses HEMATOLOGIC: No significant bruising NEUROLOGIC: Oriented to person, place, and time. Nonfocal. Normal muscle tone.  PSYCHIATRIC:  Normal mood and affect. Normal behavior. Cooperative  CTA PE Protocol  08/27/2020: 1. Large right-sided pulmonary embolism extending from the right main pulmonary artery into lobar, segmental and subsegmental sized branches in the right lung. 2. Mild cardiomegaly. 3. Aortic atherosclerosis.  CARDIAC DATABASE: EKG: 03/21/2021: NSR, 64 bpm, normal axis, without underlying injury pattern.    Echocardiogram: 08/28/2020:  1. Left ventricular ejection fraction, by estimation, is 65 to 70%. The left ventricle has normal function. The left ventricle has no regional wall motion abnormalities. There is mild left ventricular hypertrophy. Left ventricular diastolic parameters  are consistent with Grade I diastolic dysfunction (impaired relaxation).   2. Right ventricular systolic function is normal. The right ventricular  size is mildly enlarged. There is mildly elevated pulmonary artery systolic pressure. The estimated right ventricular systolic pressure is  96.2 mmHg.   3. The mitral valve is normal in structure. No evidence of mitral valve regurgitation.   4. The aortic valve is tricuspid. Aortic valve regurgitation is not visualized. No aortic stenosis is present.   5. The inferior vena cava is normal in size with greater than 50% respiratory variability, suggesting right atrial pressure of 3 mmHg.  Stress Testing:  Lexiscan Sestamibi stress test 09/08/2020: 1 Day Rest/Stress Protocol. Stress EKG is non-diagnostic,  as this is pharmacological stress test using Lexiscan. Normal myocardial perfusion without evidence of reversible ischemia or prior infarct. Left ventricular size normal. Left ventricular wall thickness is preserved without regional wall motion abnormalities, calculated LVEF 55%. Compared to prior study 07/27/2017: No significant change.  Low risk study.   Heart Catheterization: None  LABORATORY DATA: CBC Latest Ref Rng & Units 08/28/2020 08/28/2020 08/27/2020  WBC 4.0 - 10.5 K/uL  9.2 9.8 9.1  Hemoglobin 12.0 - 15.0 g/dL 11.7(L) 12.0 13.0  Hematocrit 36.0 - 46.0 % 35.7(L) 36.8 40.4  Platelets 150 - 400 K/uL 236 244 229    CMP Latest Ref Rng & Units 08/28/2020 08/28/2020 08/26/2020  Glucose 70 - 99 mg/dL 85 94 110(H)  BUN 8 - 23 mg/dL 22 12 16   Creatinine 0.44 - 1.00 mg/dL 0.97 0.96 1.04(H)  Sodium 135 - 145 mmol/L 135 137 141  Potassium 3.5 - 5.1 mmol/L 4.4 4.1 4.3  Chloride 98 - 111 mmol/L 105 105 106  CO2 22 - 32 mmol/L 22 21(L) 19(L)  Calcium 8.9 - 10.3 mg/dL 9.0 9.4 9.6  Total Protein 6.5 - 8.1 g/dL - - -  Total Bilirubin 0.3 - 1.2 mg/dL - - -  Alkaline Phos 38 - 126 U/L - - -  AST 15 - 41 U/L - - -  ALT 14 - 54 U/L - - -    Lipid Panel  No results found for: CHOL, TRIG, HDL, CHOLHDL, VLDL, LDLCALC, LDLDIRECT, LABVLDL  No results found for: HGBA1C No components found for: NTPROBNP No results found for: TSH  Cardiac Panel (last 3 results) No results for input(s): CKTOTAL, CKMB, TROPONINIHS, RELINDX in the last 72 hours.  IMPRESSION:    ICD-10-CM   1. Chronic heart failure with preserved ejection fraction (HFpEF) (HCC)  I50.32 EKG 12-Lead    2. Essential hypertension  I10     3. Hx of Large right-sided pulmonary embolism extending from the right main pulmonary artery into lobar, segmental and subsegmental sized branches in the right lung (08/27/2020)  Z86.711        RECOMMENDATIONS: ASHTAN LATON is a 80 y.o. female whose past medical history and cardiovascular risk factors include: Hx of Large right-sided pulmonary embolism extending from the right main pulmonary artery into lobar, segmental and subsegmental sized branches in the right lung (08/27/2020), aortic atherosclerosis, hypertension, obesity, chronic diastolic heart failure, Former smoker, advanced age, postmenopausal female.  Chronic heart failure with preserved ejection fraction (HFpEF) ([place]) Euvolemic. Stage B, NYHA class II Medications reconciled. Educated on the importance of a  low-salt diet, daily weights, increasing physical activity as tolerated with a goal of 30 minutes a day 5 days a week. Weight has remained relatively stable, gained 2 pounds over the last 6 months due to dietary indiscretion. Plan echocardiogram to reevaluate LVEF and RV function given her history of PE.  Essential hypertension Within acceptable range. Medications reconciled. Monitor for now  Hx of Large right-sided pulmonary embolism extending from the right main pulmonary artery into lobar, segmental and subsegmental sized branches in the right lung (08/27/2020) Diagnosed with pulmonary embolism in April 2022. Patient states that this is her first episode of pulmonary embolism. Continues to be on Xarelto. Patient states that the oral anticoagulation is currently being managed by primary team.  Will defer the duration of oral anticoagulation to either primary team and or if needed may consider hematology evaluation.  FINAL MEDICATION LIST END OF ENCOUNTER: No orders of the defined types were placed in this  encounter.    Current Outpatient Medications:    fexofenadine (ALLEGRA) 180 MG tablet, Take 1 tablet (180 mg total) by mouth daily. (Patient taking differently: Take 180 mg by mouth as needed for allergies.), Disp: 30 tablet, Rfl: 5   latanoprost (XALATAN) 0.005 % ophthalmic solution, Place 1 drop into both eyes at bedtime., Disp: , Rfl:    metoprolol succinate (TOPROL-XL) 25 MG 24 hr tablet, TAKE 1 TABLET(25 MG) BY MOUTH DAILY, Disp: 90 tablet, Rfl: 0   Omega-3 Fatty Acids (FISH OIL PO), Take 1 tablet by mouth daily., Disp: , Rfl:    Propylene Glycol (SYSTANE BALANCE OP), Apply 1 drop to eye daily., Disp: , Rfl:    sacubitril-valsartan (ENTRESTO) 97-103 MG, Take 1 tablet by mouth daily. , Disp: , Rfl:    spironolactone-hydrochlorothiazide (ALDACTAZIDE) 25-25 MG tablet, TAKE 1 TABLET BY MOUTH DAILY, Disp: 90 tablet, Rfl: 2   XARELTO 20 MG TABS tablet, Take 20 mg by mouth daily., Disp: ,  Rfl:   Current Facility-Administered Medications:    omalizumab Arvid Right) injection 300 mg, 300 mg, Subcutaneous, Q28 days, Kennith Gain, MD, 300 mg at 03/03/21 1107  Orders Placed This Encounter  Procedures   EKG 12-Lead   --Continue cardiac medications as reconciled in final medication list. --Return in about 6 months (around 09/18/2021) for Follow up, heart failure management.. Or sooner if needed. --Continue follow-up with your primary care physician regarding the management of your other chronic comorbid conditions.  Patient's questions and concerns were addressed to her satisfaction. She voices understanding of the instructions provided during this encounter.   This note was created using a voice recognition software as a result there may be grammatical errors inadvertently enclosed that do not reflect the nature of this encounter. Every attempt is made to correct such errors.  Rex Kras, [place], [facility]  Pager: [phone] Office: [phone]

## 2021-03-29 DIAGNOSIS — L501 Idiopathic urticaria: Secondary | ICD-10-CM | POA: Diagnosis not present

## 2021-03-30 ENCOUNTER — Other Ambulatory Visit: Payer: Self-pay

## 2021-03-30 ENCOUNTER — Ambulatory Visit (INDEPENDENT_AMBULATORY_CARE_PROVIDER_SITE_OTHER): Payer: Medicare Other

## 2021-03-30 DIAGNOSIS — L501 Idiopathic urticaria: Secondary | ICD-10-CM | POA: Diagnosis not present

## 2021-04-26 DIAGNOSIS — L501 Idiopathic urticaria: Secondary | ICD-10-CM | POA: Diagnosis not present

## 2021-04-27 ENCOUNTER — Ambulatory Visit (INDEPENDENT_AMBULATORY_CARE_PROVIDER_SITE_OTHER): Payer: Medicare Other

## 2021-04-27 ENCOUNTER — Other Ambulatory Visit: Payer: Self-pay

## 2021-04-27 DIAGNOSIS — L501 Idiopathic urticaria: Secondary | ICD-10-CM | POA: Diagnosis not present

## 2021-05-16 ENCOUNTER — Other Ambulatory Visit: Payer: Self-pay | Admitting: Cardiology

## 2021-05-24 DIAGNOSIS — L501 Idiopathic urticaria: Secondary | ICD-10-CM | POA: Diagnosis not present

## 2021-05-25 ENCOUNTER — Ambulatory Visit (INDEPENDENT_AMBULATORY_CARE_PROVIDER_SITE_OTHER): Payer: Medicare Other

## 2021-05-25 ENCOUNTER — Other Ambulatory Visit: Payer: Self-pay

## 2021-05-25 DIAGNOSIS — L501 Idiopathic urticaria: Secondary | ICD-10-CM | POA: Diagnosis not present

## 2021-06-19 ENCOUNTER — Other Ambulatory Visit: Payer: Self-pay | Admitting: Cardiology

## 2021-06-19 DIAGNOSIS — I5032 Chronic diastolic (congestive) heart failure: Secondary | ICD-10-CM

## 2021-06-23 ENCOUNTER — Ambulatory Visit (INDEPENDENT_AMBULATORY_CARE_PROVIDER_SITE_OTHER): Payer: Medicare Other

## 2021-06-23 ENCOUNTER — Other Ambulatory Visit: Payer: Self-pay

## 2021-06-23 DIAGNOSIS — L501 Idiopathic urticaria: Secondary | ICD-10-CM

## 2021-07-20 DIAGNOSIS — L501 Idiopathic urticaria: Secondary | ICD-10-CM | POA: Diagnosis not present

## 2021-07-21 ENCOUNTER — Ambulatory Visit (INDEPENDENT_AMBULATORY_CARE_PROVIDER_SITE_OTHER): Payer: Medicare Other

## 2021-07-21 ENCOUNTER — Other Ambulatory Visit: Payer: Self-pay

## 2021-07-21 DIAGNOSIS — L501 Idiopathic urticaria: Secondary | ICD-10-CM

## 2021-08-09 DIAGNOSIS — I2699 Other pulmonary embolism without acute cor pulmonale: Secondary | ICD-10-CM | POA: Diagnosis not present

## 2021-08-09 DIAGNOSIS — F5101 Primary insomnia: Secondary | ICD-10-CM | POA: Diagnosis not present

## 2021-08-09 DIAGNOSIS — M130 Polyarthritis, unspecified: Secondary | ICD-10-CM | POA: Diagnosis not present

## 2021-08-09 DIAGNOSIS — I110 Hypertensive heart disease with heart failure: Secondary | ICD-10-CM | POA: Diagnosis not present

## 2021-08-17 DIAGNOSIS — L501 Idiopathic urticaria: Secondary | ICD-10-CM | POA: Diagnosis not present

## 2021-08-18 ENCOUNTER — Ambulatory Visit (INDEPENDENT_AMBULATORY_CARE_PROVIDER_SITE_OTHER): Payer: Medicare Other

## 2021-08-18 DIAGNOSIS — L501 Idiopathic urticaria: Secondary | ICD-10-CM

## 2021-09-08 DIAGNOSIS — I110 Hypertensive heart disease with heart failure: Secondary | ICD-10-CM | POA: Diagnosis not present

## 2021-09-08 DIAGNOSIS — Z0000 Encounter for general adult medical examination without abnormal findings: Secondary | ICD-10-CM | POA: Diagnosis not present

## 2021-09-08 DIAGNOSIS — F064 Anxiety disorder due to known physiological condition: Secondary | ICD-10-CM | POA: Diagnosis not present

## 2021-09-08 DIAGNOSIS — M130 Polyarthritis, unspecified: Secondary | ICD-10-CM | POA: Diagnosis not present

## 2021-09-14 DIAGNOSIS — L501 Idiopathic urticaria: Secondary | ICD-10-CM | POA: Diagnosis not present

## 2021-09-15 ENCOUNTER — Ambulatory Visit (INDEPENDENT_AMBULATORY_CARE_PROVIDER_SITE_OTHER): Payer: Medicare Other

## 2021-09-15 DIAGNOSIS — L501 Idiopathic urticaria: Secondary | ICD-10-CM

## 2021-09-19 ENCOUNTER — Encounter: Payer: Self-pay | Admitting: Cardiology

## 2021-09-19 ENCOUNTER — Ambulatory Visit: Payer: Medicare Other | Admitting: Cardiology

## 2021-09-19 VITALS — BP 109/58 | HR 69 | Temp 97.7°F | Resp 16 | Ht 63.0 in | Wt 257.0 lb

## 2021-09-19 DIAGNOSIS — I5032 Chronic diastolic (congestive) heart failure: Secondary | ICD-10-CM | POA: Diagnosis not present

## 2021-09-19 DIAGNOSIS — I10 Essential (primary) hypertension: Secondary | ICD-10-CM | POA: Diagnosis not present

## 2021-09-19 DIAGNOSIS — Z86711 Personal history of pulmonary embolism: Secondary | ICD-10-CM

## 2021-09-19 DIAGNOSIS — Z6841 Body Mass Index (BMI) 40.0 and over, adult: Secondary | ICD-10-CM | POA: Diagnosis not present

## 2021-09-19 MED ORDER — SPIRONOLACTONE 25 MG PO TABS: 25.0000 mg | ORAL_TABLET | Freq: Every morning | ORAL | 0 refills | Status: DC

## 2021-09-19 NOTE — Progress Notes (Signed)
? ?Kimberly Crosby ?Date of Birth: 03/01/1941 ?MRN: [id number] ?Primary Care Provider:Bland, Myra Rude, MD ?Former Cardiology Providers: Dr. Adrian Prows, Jeri Lager, APRN, FNP-C ?Primary Cardiologist: Rex Kras, DO, [facility] (established care 02/26/2020) ? ?Date: 09/19/21 ?Last Office Visit: March 21, 2021 ? ?Chief Complaint  ?Patient presents with  ? heart failure management  ? Follow-up  ? ? ?HPI  ?Kimberly Crosby is a 81 y.o.  female whose past medical history and cardiovascular risk factors include: Hx of Large right-sided pulmonary embolism extending from the right main pulmonary artery into lobar, segmental and subsegmental sized branches in the right lung (08/27/2020), aortic atherosclerosis, hypertension, obesity, chronic diastolic heart failure, Former smoker, advanced age, postmenopausal female. ? ?Patient presents today for 39-monthfollow-up visit for heart failure management.  Since last office visit patient is doing well from a cardiac standpoint.  Shortness of breath is chronic and stable.  She denies angina pectoris, orthopnea, paroxysmal nocturnal dyspnea or lower extremity swelling.  No hospitalizations for cardiovascular symptoms. ? ?Review of medications notes that she is still on Xarelto given her pulmonary embolism back in April 2022.  At the last office visit she was recommended to follow-up with primary for further recommendations with regards to anticoagulation.  Patient states that this is still pending.  She does not endorse any evidence of bleeding.  I reemphasized the importance of this. ? ?FUNCTIONAL STATUS: She walks 3 days a week each time walking about 3 mile.   ? ?ALLERGIES: ?No Known Allergies ? ? ?MEDICATION LIST PRIOR TO VISIT: ?Current Outpatient Medications on File Prior to Visit  ?Medication Sig Dispense Refill  ? fexofenadine (ALLEGRA) 180 MG tablet Take 1 tablet (180 mg total) by mouth daily. (Patient taking differently: Take 180 mg by mouth as needed for allergies.) 30 tablet 5   ? latanoprost (XALATAN) 0.005 % ophthalmic solution Place 1 drop into both eyes at bedtime.    ? metoprolol succinate (TOPROL-XL) 25 MG 24 hr tablet TAKE 1 TABLET(25 MG) BY MOUTH DAILY 90 tablet 0  ? Omega-3 Fatty Acids (FISH OIL PO) Take 1 tablet by mouth daily.    ? Propylene Glycol (SYSTANE BALANCE OP) Apply 1 drop to eye daily.    ? sacubitril-valsartan (ENTRESTO) 97-103 MG Take 1 tablet by mouth daily.     ? XARELTO 20 MG TABS tablet Take 20 mg by mouth daily.    ? ?Current Facility-Administered Medications on File Prior to Visit  ?Medication Dose Route Frequency Provider Last Rate Last Admin  ? omalizumab (Arvid Right injection 300 mg  300 mg Subcutaneous Q28 days PKennith Gain MD   300 mg at 08/18/21 1103  ? ? ?PAST MEDICAL HISTORY: ?Past Medical History:  ?Diagnosis Date  ? Asthma   ? Breast cyst   ? [place]estive [place]   ? Edema   ? Hypertension   ? Osteoporosis 11/2017  ? T score -3.4 distal third radius  ? [place]   ? ? ?PAST SURGICAL HISTORY: ?Past Surgical History:  ?Procedure Laterality Date  ? BREAST EXCISIONAL BIOPSY Left   ? BREAST SURGERY    ? biopsy-cyst  ? ? ?FAMILY HISTORY: ?The patient's family history includes Breast cancer (age of onset: 762 in her mother; Diabetes in her brother; Heart disease (age of onset: 831 in her father. ?  ?SOCIAL HISTORY:  ?The patient  reports that she quit smoking about 27 years ago. Her smoking use included cigarettes. She has a 0.75 pack-year smoking history. She has never  used smokeless tobacco. She reports current alcohol use. She reports that she does not use drugs. ? ?Review of Systems  ?Cardiovascular:  Negative for chest pain, claudication, dyspnea on exertion, leg swelling, near-syncope, orthopnea, palpitations, paroxysmal nocturnal dyspnea and syncope.  ?Respiratory:  Positive for shortness of breath (Stable).   ? ?PHYSICAL EXAM: ? ?  09/19/2021  ? 11:42 AM 09/19/2021  ? 11:40 AM 03/21/2021  ? 11:42 AM  ?Vitals  with BMI  ?Height  '5\' 3"'$  '5\' 3"'$   ?Weight  257 lbs 258 lbs  ?BMI  45.54 45.71  ?Systolic 427 88 062  ?Diastolic 58 57 73  ?Pulse 69 72 65  ? ? ?CONSTITUTIONAL: Well-developed and well-nourished. No acute distress.  ?SKIN: Skin is warm and dry. No rash noted. No cyanosis. No pallor. No jaundice ?HEAD: Normocephalic and atraumatic.  ?EYES: No scleral icterus ?MOUTH/THROAT: Moist oral membranes.  ?NECK: No JVD present. No thyromegaly noted. No carotid bruits  ?CHEST Normal respiratory effort. No intercostal retractions  ?LUNGS: Clear to auscultation bilaterally. No stridor. No wheezes. No rales.  ?CARDIOVASCULAR: Regular rate and rhythm, positive S1-S2, no murmurs rubs or gallops appreciated. ?ABDOMINAL: Obese, soft, nontender, nondistended, positive bowel sounds all 4 quadrants. No apparent ascites.  ?EXTREMITIES: No peripheral edema, warm to touch, palpable DP and PT pulses ?HEMATOLOGIC: No significant bruising ?NEUROLOGIC: Oriented to person, place, and time. Nonfocal. Normal muscle tone.  ?PSYCHIATRIC: Normal mood and affect. Normal behavior. Cooperative ? ?CTA PE Protocol  ?08/27/2020: ?1. Large right-sided pulmonary embolism extending from the right main pulmonary artery into lobar, segmental and subsegmental sized branches in the right lung. ?2. Mild cardiomegaly. ?3. Aortic atherosclerosis. ? ?CARDIAC DATABASE: ?EKG: ?09/19/2021: NSR, 69 bpm, normal axis, without underlying ischemia injury pattern. ? ?Echocardiogram: ?08/28/2020: ? 1. Left ventricular ejection fraction, by estimation, is 65 to 70%. The left ventricle has normal function. The left ventricle has no regional wall motion abnormalities. There is mild left ventricular hypertrophy. Left ventricular diastolic parameters  ?are consistent with Grade I diastolic dysfunction (impaired relaxation).  ? 2. Right ventricular systolic function is normal. The right ventricular  size is mildly enlarged. There is mildly elevated pulmonary artery systolic pressure.  The estimated right ventricular systolic pressure is  37.6 mmHg.  ? 3. The mitral valve is normal in structure. No evidence of mitral valve regurgitation.  ? 4. The aortic valve is tricuspid. Aortic valve regurgitation is not visualized. No aortic stenosis is present.  ? 5. The inferior vena cava is normal in size with greater than 50% respiratory variability, suggesting right atrial pressure of 3 mmHg. ? ?Stress Testing:  ?Lexiscan Sestamibi stress test 09/08/2020: ?1 Day Rest/Stress Protocol. ?Stress EKG is non-diagnostic, as this is pharmacological stress test using Lexiscan. ?Normal myocardial perfusion without evidence of reversible ischemia or prior infarct. ?Left ventricular size normal. ?Left ventricular wall thickness is preserved without regional wall motion abnormalities, calculated LVEF 55%. ?Compared to prior study 07/27/2017: No significant change.  ?Low risk study.  ? ?Heart Catheterization: ?None ? ?LABORATORY DATA: ? ?  Latest Ref Rng & Units 08/28/2020  ? 11:49 PM 08/28/2020  ?  1:17 AM 08/27/2020  ?  2:30 PM  ?CBC  ?WBC 4.0 - 10.5 K/uL 9.2   9.8   9.1    ?Hemoglobin 12.0 - 15.0 g/dL 11.7   12.0   13.0    ?Hematocrit 36.0 - 46.0 % 35.7   36.8   40.4    ?Platelets 150 - 400 K/uL 236   244  229    ? ? ? ?  Latest Ref Rng & Units 08/28/2020  ? 11:49 PM 08/28/2020  ? 10:22 AM 08/26/2020  ?  1:42 PM  ?CMP  ?Glucose 70 - 99 mg/dL 85   94   110    ?BUN 8 - 23 mg/dL '22   12   16    '$ ?Creatinine 0.44 - 1.00 mg/dL 0.97   0.96   1.04    ?Sodium 135 - 145 mmol/L 135   137   141    ?Potassium 3.5 - 5.1 mmol/L 4.4   4.1   4.3    ?Chloride 98 - 111 mmol/L 105   105   106    ?CO2 22 - 32 mmol/L '22   21   19    '$ ?Calcium 8.9 - 10.3 mg/dL 9.0   9.4   9.6    ? ? ?Lipid Panel  ?No results found for: CHOL, TRIG, HDL, CHOLHDL, VLDL, LDLCALC, LDLDIRECT, LABVLDL ? ?No results found for: HGBA1C ?No components found for: NTPROBNP ?No results found for: TSH ? ?Cardiac Panel (last 3 results) ?No results for input(s): CKTOTAL, CKMB,  TROPONINIHS, RELINDX in the last 72 hours. ? ?IMPRESSION: ? ?  ICD-10-CM   ?1. Chronic heart failure with preserved ejection fraction (HFpEF) (HCC)  I50.32 EKG 12-Lead  ?  spironolactone (ALDACTONE) 25 MG t

## 2021-09-24 ENCOUNTER — Other Ambulatory Visit: Payer: Self-pay | Admitting: Cardiology

## 2021-09-24 DIAGNOSIS — I5032 Chronic diastolic (congestive) heart failure: Secondary | ICD-10-CM

## 2021-09-28 DIAGNOSIS — H353131 Nonexudative age-related macular degeneration, bilateral, early dry stage: Secondary | ICD-10-CM | POA: Diagnosis not present

## 2021-09-28 DIAGNOSIS — H5203 Hypermetropia, bilateral: Secondary | ICD-10-CM | POA: Diagnosis not present

## 2021-09-28 DIAGNOSIS — H401122 Primary open-angle glaucoma, left eye, moderate stage: Secondary | ICD-10-CM | POA: Diagnosis not present

## 2021-09-28 DIAGNOSIS — H401111 Primary open-angle glaucoma, right eye, mild stage: Secondary | ICD-10-CM | POA: Diagnosis not present

## 2021-10-13 ENCOUNTER — Ambulatory Visit (INDEPENDENT_AMBULATORY_CARE_PROVIDER_SITE_OTHER): Payer: Medicare Other

## 2021-10-13 DIAGNOSIS — L501 Idiopathic urticaria: Secondary | ICD-10-CM

## 2021-11-04 ENCOUNTER — Other Ambulatory Visit: Payer: Self-pay

## 2021-11-04 ENCOUNTER — Telehealth: Payer: Self-pay

## 2021-11-04 DIAGNOSIS — I5032 Chronic diastolic (congestive) heart failure: Secondary | ICD-10-CM

## 2021-11-04 MED ORDER — FUROSEMIDE 20 MG PO TABS: 20.0000 mg | ORAL_TABLET | ORAL | 0 refills | Status: DC

## 2021-11-04 NOTE — Telephone Encounter (Signed)
Pt called and stated that her legs having been swelling since she was taken off of her diuretic. She wants to know if you could send in another prescription. Please advise.

## 2021-11-04 NOTE — Telephone Encounter (Signed)
I called patient medication has been sent and labs have been ordered patient voiced understanding

## 2021-11-04 NOTE — Telephone Encounter (Signed)
Called and spoke to pt, she has not been checking her BP because she does not have a BP cuff. Medication list is accurate and up to date.

## 2021-11-04 NOTE — Telephone Encounter (Signed)
I took her off of hydrochlorothiazide in the past due to soft blood pressures.  Please ask her how her blood pressures have been running?   And please perform medication reconciliation.  Dr. Terri Skains

## 2021-11-04 NOTE — Telephone Encounter (Signed)
Please order Lasix 20 mg p.o. every other day. Labs in 2 weeks please order and release BMP, magnesium and NT proBNP. Please ask her to keep a log of her weights daily.   And if she notices a change of more than 1 pound over 24 hours or 3 pounds over the course of a week to call us back.  Kimberly Blixt [place], DO, [facility]

## 2021-11-08 ENCOUNTER — Other Ambulatory Visit: Payer: Self-pay | Admitting: Cardiology

## 2021-11-08 DIAGNOSIS — I5032 Chronic diastolic (congestive) heart failure: Secondary | ICD-10-CM

## 2021-11-10 DIAGNOSIS — L501 Idiopathic urticaria: Secondary | ICD-10-CM | POA: Diagnosis not present

## 2021-11-11 ENCOUNTER — Ambulatory Visit (INDEPENDENT_AMBULATORY_CARE_PROVIDER_SITE_OTHER): Payer: Medicare Other | Admitting: *Deleted

## 2021-11-11 DIAGNOSIS — L501 Idiopathic urticaria: Secondary | ICD-10-CM

## 2021-11-17 ENCOUNTER — Other Ambulatory Visit: Payer: Self-pay | Admitting: Obstetrics & Gynecology

## 2021-11-17 DIAGNOSIS — Z1231 Encounter for screening mammogram for malignant neoplasm of breast: Secondary | ICD-10-CM

## 2021-11-22 DIAGNOSIS — I10 Essential (primary) hypertension: Secondary | ICD-10-CM | POA: Diagnosis not present

## 2021-11-22 DIAGNOSIS — M7989 Other specified soft tissue disorders: Secondary | ICD-10-CM | POA: Diagnosis not present

## 2021-11-23 LAB — BASIC METABOLIC PANEL
BUN/Creatinine Ratio: 15 (ref 12–28)
BUN: 15 mg/dL (ref 8–27)
CO2: 21 mmol/L (ref 20–29)
Calcium: 9.7 mg/dL (ref 8.7–10.3)
Chloride: 105 mmol/L (ref 96–106)
Creatinine, Ser: 0.99 mg/dL (ref 0.57–1.00)
Glucose: 92 mg/dL (ref 70–99)
Potassium: 4.5 mmol/L (ref 3.5–5.2)
Sodium: 140 mmol/L (ref 134–144)
eGFR: 57 mL/min/{1.73_m2} — ABNORMAL LOW (ref >59)

## 2021-11-23 LAB — PRO B NATRIURETIC PEPTIDE: NT-Pro BNP: 66 pg/mL (ref 0–738)

## 2021-11-23 LAB — MAGNESIUM: Magnesium: 2.1 mg/dL (ref 1.6–2.3)

## 2021-11-25 NOTE — Progress Notes (Signed)
Called pt no answer, left a vm

## 2021-11-30 NOTE — Progress Notes (Signed)
Called pt no answer, left a vm

## 2021-12-05 DIAGNOSIS — I110 Hypertensive heart disease with heart failure: Secondary | ICD-10-CM | POA: Diagnosis not present

## 2021-12-05 DIAGNOSIS — E785 Hyperlipidemia, unspecified: Secondary | ICD-10-CM | POA: Diagnosis not present

## 2021-12-05 DIAGNOSIS — I5032 Chronic diastolic (congestive) heart failure: Secondary | ICD-10-CM | POA: Diagnosis not present

## 2021-12-06 NOTE — Progress Notes (Signed)
Called pt for the third time and no answer, left a vm

## 2021-12-08 ENCOUNTER — Ambulatory Visit: Payer: Federal, State, Local not specified - PPO

## 2021-12-09 DIAGNOSIS — I2699 Other pulmonary embolism without acute cor pulmonale: Secondary | ICD-10-CM | POA: Diagnosis not present

## 2021-12-09 DIAGNOSIS — E785 Hyperlipidemia, unspecified: Secondary | ICD-10-CM | POA: Diagnosis not present

## 2021-12-09 DIAGNOSIS — E8881 Metabolic syndrome: Secondary | ICD-10-CM | POA: Diagnosis not present

## 2021-12-09 DIAGNOSIS — I5032 Chronic diastolic (congestive) heart failure: Secondary | ICD-10-CM | POA: Diagnosis not present

## 2021-12-09 DIAGNOSIS — E6609 Other obesity due to excess calories: Secondary | ICD-10-CM | POA: Diagnosis not present

## 2021-12-09 DIAGNOSIS — R7309 Other abnormal glucose: Secondary | ICD-10-CM | POA: Diagnosis not present

## 2021-12-09 DIAGNOSIS — I110 Hypertensive heart disease with heart failure: Secondary | ICD-10-CM | POA: Diagnosis not present

## 2021-12-09 DIAGNOSIS — M130 Polyarthritis, unspecified: Secondary | ICD-10-CM | POA: Diagnosis not present

## 2021-12-09 DIAGNOSIS — I10 Essential (primary) hypertension: Secondary | ICD-10-CM | POA: Diagnosis not present

## 2021-12-13 DIAGNOSIS — L501 Idiopathic urticaria: Secondary | ICD-10-CM | POA: Diagnosis not present

## 2021-12-14 ENCOUNTER — Ambulatory Visit (INDEPENDENT_AMBULATORY_CARE_PROVIDER_SITE_OTHER): Payer: Medicare Other

## 2021-12-14 DIAGNOSIS — L501 Idiopathic urticaria: Secondary | ICD-10-CM | POA: Diagnosis not present

## 2021-12-22 ENCOUNTER — Other Ambulatory Visit: Payer: Self-pay | Admitting: Cardiology

## 2021-12-22 ENCOUNTER — Ambulatory Visit
Admission: RE | Admit: 2021-12-22 | Discharge: 2021-12-22 | Disposition: A | Discharge status: Home / Self Care | Payer: Medicare Other | Source: Ambulatory Visit | Attending: Obstetrics & Gynecology | Admitting: Obstetrics & Gynecology

## 2021-12-22 DIAGNOSIS — I5032 Chronic diastolic (congestive) heart failure: Secondary | ICD-10-CM

## 2021-12-22 DIAGNOSIS — Z1231 Encounter for screening mammogram for malignant neoplasm of breast: Secondary | ICD-10-CM

## 2021-12-26 ENCOUNTER — Other Ambulatory Visit: Payer: Self-pay | Admitting: Obstetrics & Gynecology

## 2021-12-26 DIAGNOSIS — R928 Other abnormal and inconclusive findings on diagnostic imaging of breast: Secondary | ICD-10-CM

## 2022-01-04 ENCOUNTER — Ambulatory Visit: Payer: Medicare Other

## 2022-01-04 ENCOUNTER — Ambulatory Visit
Admission: RE | Admit: 2022-01-04 | Discharge: 2022-01-04 | Disposition: A | Discharge status: Home / Self Care | Payer: Medicare Other | Source: Ambulatory Visit | Attending: Obstetrics & Gynecology | Admitting: Obstetrics & Gynecology

## 2022-01-04 DIAGNOSIS — R928 Other abnormal and inconclusive findings on diagnostic imaging of breast: Secondary | ICD-10-CM | POA: Diagnosis not present

## 2022-01-05 DIAGNOSIS — E785 Hyperlipidemia, unspecified: Secondary | ICD-10-CM | POA: Diagnosis not present

## 2022-01-05 DIAGNOSIS — I5032 Chronic diastolic (congestive) heart failure: Secondary | ICD-10-CM | POA: Diagnosis not present

## 2022-01-05 DIAGNOSIS — I110 Hypertensive heart disease with heart failure: Secondary | ICD-10-CM | POA: Diagnosis not present

## 2022-01-11 ENCOUNTER — Other Ambulatory Visit: Payer: Self-pay | Admitting: Cardiology

## 2022-01-17 DIAGNOSIS — L501 Idiopathic urticaria: Secondary | ICD-10-CM

## 2022-01-18 ENCOUNTER — Ambulatory Visit (INDEPENDENT_AMBULATORY_CARE_PROVIDER_SITE_OTHER): Payer: Medicare Other | Admitting: *Deleted

## 2022-01-18 DIAGNOSIS — L501 Idiopathic urticaria: Secondary | ICD-10-CM

## 2022-01-23 ENCOUNTER — Ambulatory Visit: Payer: Federal, State, Local not specified - PPO | Admitting: Obstetrics & Gynecology

## 2022-01-27 ENCOUNTER — Encounter: Payer: Self-pay | Admitting: Obstetrics & Gynecology

## 2022-01-27 ENCOUNTER — Ambulatory Visit (INDEPENDENT_AMBULATORY_CARE_PROVIDER_SITE_OTHER): Payer: Medicare Other | Admitting: Obstetrics & Gynecology

## 2022-01-27 VITALS — BP 118/72 | HR 58 | Ht 61.75 in | Wt 258.0 lb

## 2022-01-27 DIAGNOSIS — Z780 Asymptomatic menopausal state: Secondary | ICD-10-CM

## 2022-01-27 DIAGNOSIS — M810 Age-related osteoporosis without current pathological fracture: Secondary | ICD-10-CM

## 2022-01-27 DIAGNOSIS — Z01419 Encounter for gynecological examination (general) (routine) without abnormal findings: Secondary | ICD-10-CM

## 2022-01-27 NOTE — Progress Notes (Signed)
Kimberly Crosby 1940/08/23 [id number]   History:    81 y.o. [id number]   RP:  Established patient presenting for annual gyn exam    HPI: Postmenopausal, well on no HRT.  No hot flashes or night sweats.  No PMB.  No pelvic pain.  Pap smear Neg in 2017.  No indication to repeat a Pap test at this time.  Breasts normal. Screening mammo Lt Neg 12/2021. Rt Dx mammo/US Neg.  [place] 2021.  BD 03/2020 stable with Osteoporosis only at the Lt Forearm.  Repeat BD 03/2022 here.  BMI 47.57.  Health labs with Fam MD.   Past medical history,surgical history, family history and social history were all reviewed and documented in the EPIC chart.  Gynecologic History No LMP recorded. Patient is postmenopausal.  Obstetric History OB History  Gravida Para Term Preterm AB Living  '4 3 3   1 3  '$ SAB IAB Ectopic Multiple Live Births  1            # Outcome Date GA Lbr Len/2nd Weight Sex Delivery Anes PTL Lv  4 SAB           3 Term           2 Term           1 Term              ROS: A ROS was performed and pertinent positives and negatives are included in the history.  GENERAL: No fevers or chills. HEENT: No change in vision, no earache, sore throat or sinus congestion. NECK: No pain or stiffness. CARDIOVASCULAR: No chest pain or pressure. No palpitations. PULMONARY: No shortness of breath, cough or wheeze. GASTROINTESTINAL: No abdominal pain, nausea, vomiting or diarrhea, melena or bright red blood per rectum. GENITOURINARY: No urinary frequency, urgency, hesitancy or dysuria. MUSCULOSKELETAL: No joint or muscle pain, no back pain, no recent trauma. DERMATOLOGIC: No rash, no itching, no lesions. ENDOCRINE: No polyuria, polydipsia, no heat or cold intolerance. No recent change in weight. HEMATOLOGICAL: No anemia or easy bruising or bleeding. NEUROLOGIC: No headache, seizures, numbness, tingling or weakness. PSYCHIATRIC: No depression, no loss of interest in normal activity or change in sleep pattern.      Exam:   BP 118/72   Pulse (!) 58   Ht 5' 1.75" (1.568 m)   Wt 258 lb (117 kg)   SpO2 99%   BMI 47.57 kg/m   Body mass index is 47.57 kg/m.  General appearance : Well developed well nourished female. No acute distress HEENT: Eyes: no retinal hemorrhage or exudates,  Neck supple, trachea midline, no carotid bruits, no thyroidmegaly Lungs: Clear to auscultation, no rhonchi or wheezes, or rib retractions  Heart: Regular rate and rhythm, no murmurs or gallops Breast:Examined in sitting and supine position were symmetrical in appearance, no palpable masses or tenderness,  no skin retraction, no nipple inversion, no nipple discharge, no skin discoloration, no axillary or supraclavicular lymphadenopathy Abdomen: no palpable masses or tenderness, no rebound or guarding Extremities: no edema or skin discoloration or tenderness  Pelvic: Vulva: Normal             Vagina: No gross lesions or discharge  Cervix: No gross lesions or discharge  Uterus  AV, normal size, shape and consistency, non-tender and mobile  Adnexa  Without masses or tenderness  Anus: Normal   Assessment/Plan:  81 y.o. female for annual exam   1. Encounter for gynecological examination without abnormal finding Postmenopausal,  well on no HRT.  No hot flashes or night sweats.  No PMB.  No pelvic pain.  Pap smear Neg in 2017.  No indication to repeat a Pap test at this time.  Breasts normal. Screening mammo Lt Neg 12/2021. Rt Dx mammo/US Neg.  [place] 2021.  BD 03/2020 stable with Osteoporosis only at the Lt Forearm.  Repeat BD 03/2022 here.  BMI 47.57.  Lower calorie/carb diet.  Increase fitness activities. Health labs with Fam MD.  2. Postmenopause Postmenopausal, well on no HRT.  No hot flashes or night sweats.  No PMB.  No pelvic pain.   3. Postmenopausal osteoporosis BD 03/2020 stable with Osteoporosis only at the Lt Forearm.  Repeat BD 03/2022 here.  Vit D, Vit K2, Ca++ 1.5 g/d total.  Weight bearing activities. -  DG Bone Density; Future   Princess Bruins MD, 10:13 AM 01/27/2022

## 2022-02-01 DIAGNOSIS — H401131 Primary open-angle glaucoma, bilateral, mild stage: Secondary | ICD-10-CM | POA: Diagnosis not present

## 2022-02-17 ENCOUNTER — Telehealth: Payer: Self-pay | Admitting: *Deleted

## 2022-02-17 ENCOUNTER — Other Ambulatory Visit: Payer: Self-pay | Admitting: Family Medicine

## 2022-02-17 ENCOUNTER — Ambulatory Visit
Admission: RE | Admit: 2022-02-17 | Discharge: 2022-02-17 | Disposition: A | Discharge status: Home / Self Care | Payer: Medicare Other | Source: Ambulatory Visit | Attending: Family Medicine | Admitting: Family Medicine

## 2022-02-17 DIAGNOSIS — L501 Idiopathic urticaria: Secondary | ICD-10-CM

## 2022-02-17 DIAGNOSIS — R0602 Shortness of breath: Secondary | ICD-10-CM

## 2022-02-17 DIAGNOSIS — R059 Cough, unspecified: Secondary | ICD-10-CM | POA: Diagnosis not present

## 2022-02-17 NOTE — Chronic Care Management (AMB) (Signed)
  Care Coordination  Outreach Note  02/17/2022 Name: ONEIKA SIMONIAN MRN: [id number] DOB: [birth year]   Care Coordination Outreach Attempts: An unsuccessful telephone outreach was attempted today to offer the patient information about available care coordination services as a benefit of their health plan.   Follow Up Plan:  Additional outreach attempts will be made to offer the patient care coordination information and services.   Encounter Outcome:  No Answer  [place]  Direct Dial: [phone]

## 2022-02-20 ENCOUNTER — Ambulatory Visit (INDEPENDENT_AMBULATORY_CARE_PROVIDER_SITE_OTHER): Payer: Medicare Other | Admitting: *Deleted

## 2022-02-20 DIAGNOSIS — L501 Idiopathic urticaria: Secondary | ICD-10-CM | POA: Diagnosis not present

## 2022-02-24 DIAGNOSIS — R053 Chronic cough: Secondary | ICD-10-CM | POA: Diagnosis not present

## 2022-02-24 DIAGNOSIS — R0602 Shortness of breath: Secondary | ICD-10-CM | POA: Diagnosis not present

## 2022-02-24 NOTE — Chronic Care Management (AMB) (Signed)
  Care Coordination  Outreach Note  02/24/2022 Name: RUCHY WILDRICK MRN: [id number] DOB: 03/25/1941   Care Coordination Outreach Attempts: A second unsuccessful outreach was attempted today to offer the patient with information about available care coordination services as a benefit of their health plan.     Follow Up Plan:  Additional outreach attempts will be made to offer the patient care coordination information and services.   Encounter Outcome:  No Answer  [place]  Direct Dial: [phone]

## 2022-03-03 DIAGNOSIS — R051 Acute cough: Secondary | ICD-10-CM | POA: Diagnosis not present

## 2022-03-03 DIAGNOSIS — R042 Hemoptysis: Secondary | ICD-10-CM | POA: Diagnosis not present

## 2022-03-07 NOTE — Chronic Care Management (AMB) (Signed)
  Care Coordination  Outreach Note  03/07/2022 Name: Kimberly Crosby MRN: [id number] DOB: 1940-09-17   Care Coordination Outreach Attempts: A third unsuccessful outreach was attempted today to offer the patient with information about available care coordination services as a benefit of their health plan.   Follow Up Plan:  No further outreach attempts will be made at this time. We have been unable to contact the patient to offer or enroll patient in care coordination services  Encounter Outcome:  No Answer  [place]: [phone]

## 2022-03-17 ENCOUNTER — Other Ambulatory Visit: Payer: Self-pay | Admitting: Cardiology

## 2022-03-17 DIAGNOSIS — L501 Idiopathic urticaria: Secondary | ICD-10-CM

## 2022-03-20 ENCOUNTER — Ambulatory Visit (INDEPENDENT_AMBULATORY_CARE_PROVIDER_SITE_OTHER): Payer: Medicare Other | Admitting: *Deleted

## 2022-03-20 DIAGNOSIS — L501 Idiopathic urticaria: Secondary | ICD-10-CM | POA: Diagnosis not present

## 2022-03-21 ENCOUNTER — Ambulatory Visit (INDEPENDENT_AMBULATORY_CARE_PROVIDER_SITE_OTHER): Payer: Medicare Other

## 2022-03-21 ENCOUNTER — Other Ambulatory Visit: Payer: Self-pay | Admitting: Obstetrics & Gynecology

## 2022-03-21 DIAGNOSIS — Z780 Asymptomatic menopausal state: Secondary | ICD-10-CM

## 2022-03-21 DIAGNOSIS — Z13820 Encounter for screening for osteoporosis: Secondary | ICD-10-CM

## 2022-03-21 DIAGNOSIS — M810 Age-related osteoporosis without current pathological fracture: Secondary | ICD-10-CM

## 2022-03-21 DIAGNOSIS — Z01419 Encounter for gynecological examination (general) (routine) without abnormal findings: Secondary | ICD-10-CM

## 2022-03-22 ENCOUNTER — Ambulatory Visit: Payer: Federal, State, Local not specified - PPO | Admitting: Cardiology

## 2022-04-10 ENCOUNTER — Ambulatory Visit: Payer: Medicare Other | Admitting: Cardiology

## 2022-04-10 ENCOUNTER — Ambulatory Visit (INDEPENDENT_AMBULATORY_CARE_PROVIDER_SITE_OTHER): Payer: Medicare Other

## 2022-04-10 ENCOUNTER — Encounter (HOSPITAL_BASED_OUTPATIENT_CLINIC_OR_DEPARTMENT_OTHER): Payer: Self-pay | Admitting: Pulmonary Disease

## 2022-04-10 ENCOUNTER — Ambulatory Visit (INDEPENDENT_AMBULATORY_CARE_PROVIDER_SITE_OTHER): Payer: Medicare Other | Admitting: Pulmonary Disease

## 2022-04-10 ENCOUNTER — Encounter: Payer: Self-pay | Admitting: Cardiology

## 2022-04-10 VITALS — BP 112/66 | HR 83 | Temp 98.1°F | Ht 63.0 in | Wt 249.8 lb

## 2022-04-10 VITALS — BP 122/68 | HR 62 | Resp 18 | Ht 61.75 in | Wt 245.8 lb

## 2022-04-10 DIAGNOSIS — I5032 Chronic diastolic (congestive) heart failure: Secondary | ICD-10-CM | POA: Diagnosis not present

## 2022-04-10 DIAGNOSIS — I2699 Other pulmonary embolism without acute cor pulmonale: Secondary | ICD-10-CM

## 2022-04-10 DIAGNOSIS — R0609 Other forms of dyspnea: Secondary | ICD-10-CM

## 2022-04-10 DIAGNOSIS — Z6841 Body Mass Index (BMI) 40.0 and over, adult: Secondary | ICD-10-CM | POA: Diagnosis not present

## 2022-04-10 DIAGNOSIS — R0600 Dyspnea, unspecified: Secondary | ICD-10-CM | POA: Diagnosis not present

## 2022-04-10 DIAGNOSIS — Z86711 Personal history of pulmonary embolism: Secondary | ICD-10-CM

## 2022-04-10 DIAGNOSIS — I10 Essential (primary) hypertension: Secondary | ICD-10-CM | POA: Diagnosis not present

## 2022-04-10 MED ORDER — BREZTRI AEROSPHERE 160-9-4.8 MCG/ACT IN AERO: 2.0000 | INHALATION_SPRAY | Freq: Two times a day (BID) | RESPIRATORY_TRACT | 5 refills | Status: AC

## 2022-04-10 NOTE — Progress Notes (Signed)
[facility]  Chief Complaint  Patient presents with   Consult    Pt states she had a Pulmonary Embolism last year in December. Pt was referred by her Cardiologist.    Past Surgical History:  She  has a past surgical history that includes Breast surgery and Breast excisional biopsy (Left).  Past Medical History:  HTN, HFpEF, HLD, Chronic idiopathic urticaria and angioedema, Osteoporosis, OSA, PE April 2022  Constitutional:  BP 112/66 (BP Location: Left Arm, Patient Position: Sitting, Cuff Size: Normal)   Pulse 83   Temp 98.1 F (36.7 C) (Oral)   Ht '5\' 3"'$  (1.6 m)   Wt 249 lb 12.8 oz (113.3 kg)   SpO2 100%   BMI 44.25 kg/m   Brief Summary:  Kimberly Crosby is a 81 y.o. female former smoker with dyspnea.      Subjective:   She is followed by cardiology for HFpEF.  She had an unprovoked PE in April 2022.  She used to smoke cigarettes.  She was diagnosed with sleep apnea a year ago and uses CPAP.  She is followed by allergy/immunology for urticaria and angioedema.  She gets xolair injections.  She had pneumonia recently and was tried on breztri then.  This helped.  She has cough with clear sputum.  Gets winded after walking and has to rest.    Physical Exam:   Appearance - well kempt   ENMT - no sinus tenderness, no oral exudate, no LAN, Mallampati 3 airway, no stridor  Respiratory - equal breath sounds bilaterally, no wheezing or rales  CV - s1s2 regular rate and rhythm, no murmurs  Ext - no clubbing, no edema  Skin - no rashes  Psych - normal mood and affect   Pulmonary testing:    Chest Imaging:  CT angio chest 08/27/20 >> Large right-sided pulmonary embolism extending from the right main pulmonary artery into lobar, segmental and subsegmental sized branches in the right lung  Sleep Tests:    Cardiac Tests:  Echo 08/28/20 >> EF 65 to 70%, mild LVH, grade 1 DD, RVSP 34.6 mmHg  Social History:  She  reports that she  quit smoking about 28 years ago. Her smoking use included cigarettes. She has a 0.75 pack-year smoking history. She has never used smokeless tobacco. She reports that she does not currently use alcohol. She reports that she does not use drugs.  Family History:  Her family history includes Breast cancer (age of onset: 36) in her mother; Diabetes in her brother; Heart disease (age of onset: 21) in her father.     Assessment/Plan:   Dyspnea on exertion. - likely related to obesity and relative deconditioning; discussed importance of weight loss - she has history of allergies and tobacco abuse; could have obstructive lung disease >> will arrange for chest xray and PFT, and restart breztri - she is followed by cardiology for HFpEF  Unprovoked pulmonary embolism from April 2022. - will arrange for assessment by hematology to determine if she has thrombophilic condition that would predispose her to recurrence of pulmonary embolism  Chronic idiopathic urticaria and angioedema. - followed by Dr. Nelva Bush with allergy and immunology and treated with xolair injections  Obstructive sleep apnea. - management by her PCP  Time Spent Involved in Patient Care on Day of Examination:  46 minutes  Follow up:   Patient Instructions  Chest xray today  Will arrange for referral to hematology  Breztri two puffs in the morning  and in the evening, and rinse your mouth after each use  Will arrange for pulmonary function test  Follow up in 2 months  Medication List:   Allergies as of 04/10/2022   No Known Allergies      Medication List        Accurate as of April 10, 2022  1:33 PM. If you have any questions, ask your nurse or doctor.          Breztri Aerosphere 160-9-4.8 MCG/ACT Aero Generic drug: Budeson-Glycopyrrol-Formoterol Inhale 2 puffs into the lungs in the morning and at bedtime. Started by: Chesley Mires, MD   fexofenadine 180 MG tablet Commonly known as: ALLEGRA Take 1  tablet (180 mg total) by mouth daily. What changed:  when to take this reasons to take this   furosemide 20 MG tablet Commonly known as: LASIX TAKE 1 TABLET(20 MG) BY MOUTH EVERY OTHER DAY   latanoprost 0.005 % ophthalmic solution Commonly known as: XALATAN Place 1 drop into both eyes at bedtime.   metoprolol succinate 25 MG 24 hr tablet Commonly known as: TOPROL-XL TAKE 1 TABLET(25 MG) BY MOUTH DAILY   sacubitril-valsartan 97-103 MG Commonly known as: ENTRESTO Take 1 tablet by mouth daily.   spironolactone 25 MG tablet Commonly known as: ALDACTONE TAKE 1 TABLET(25 MG) BY MOUTH EVERY MORNING   SYSTANE BALANCE OP Apply 1 drop to eye daily.   Xarelto 20 MG Tabs tablet Generic drug: rivaroxaban Take 20 mg by mouth daily.        Signature:  Chesley Mires, MD Power Pager - [phone] 04/10/2022, 1:33 PM

## 2022-04-10 NOTE — Patient Instructions (Signed)
Chest xray today  Will arrange for referral to hematology  Breztri two puffs in the morning and in the evening, and rinse your mouth after each use  Will arrange for pulmonary function test  Follow up in 2 months

## 2022-04-10 NOTE — Progress Notes (Signed)
Kimberly Crosby Date of Birth: Oct 01, 1940 MRN: [id number] Primary Care Provider:Bland, Myra Rude, MD Former Cardiology Providers: Dr. Adrian Prows, Jeri Lager, APRN, FNP-C Primary Cardiologist: Rex Kras, DO, [facility] (established care 02/26/2020)  Date: 04/10/22 Last Office Visit: 09/19/2021  Chief Complaint  Patient presents with   Congestive Heart Failure   Hypertension   Follow-up    6 month    HPI  Kimberly Crosby is a 81 y.o.  female whose past medical history and cardiovascular risk factors include: Hx of Large right-sided pulmonary embolism extending from the right main pulmonary artery into lobar, segmental and subsegmental sized branches in the right lung (08/27/2020), aortic atherosclerosis, hypertension, obesity, chronic diastolic heart failure, Former smoker, advanced age, postmenopausal female.  Patient presents today for 88-monthfollow-up visit for management of HFpEF and benign essential hypertension.  Over the last 6 months she is doing well from a cardiovascular standpoint no hospitalizations or urgent care visits for cardiovascular symptoms.  She denies angina pectoris or heart failure symptoms.  Her hydrochlorothiazide was discontinued in the past due to dizziness.  She also has a history of pulmonary embolism dating back to April 2022.  During last office visit I had requested her to follow-up with PCP and/or pulmonologist to consider stopping oral anticoagulation as it was her first episode of PE.  However, patient still has not followed up with pulmonary medicine with regards to this.  Will defer management of anticoagulation to PCP at this time.    FUNCTIONAL STATUS: She walks 2 days a week each time walking about 2.5 mile.    ALLERGIES: No Known Allergies   MEDICATION LIST PRIOR TO VISIT: Current Outpatient Medications on File Prior to Visit  Medication Sig Dispense Refill   fexofenadine (ALLEGRA) 180 MG tablet Take 1 tablet (180 mg total) by mouth daily.  (Patient taking differently: Take 180 mg by mouth as needed for allergies.) 30 tablet 5   furosemide (LASIX) 20 MG tablet TAKE 1 TABLET(20 MG) BY MOUTH EVERY OTHER DAY 30 tablet 3   latanoprost (XALATAN) 0.005 % ophthalmic solution Place 1 drop into both eyes at bedtime.     metoprolol succinate (TOPROL-XL) 25 MG 24 hr tablet TAKE 1 TABLET(25 MG) BY MOUTH DAILY 90 tablet 0   Propylene Glycol (SYSTANE BALANCE OP) Apply 1 drop to eye daily.     sacubitril-valsartan (ENTRESTO) 97-103 MG Take 1 tablet by mouth daily.      spironolactone (ALDACTONE) 25 MG tablet TAKE 1 TABLET(25 MG) BY MOUTH EVERY MORNING 90 tablet 0   XARELTO 20 MG TABS tablet Take 20 mg by mouth daily.     Current Facility-Administered Medications on File Prior to Visit  Medication Dose Route Frequency Provider Last Rate Last Admin   omalizumab (Arvid Right injection 300 mg  300 mg Subcutaneous Q28 days PKennith Gain MD   300 mg at 03/20/22 1128    PAST MEDICAL HISTORY: Past Medical History:  Diagnosis Date   Asthma    Breast cyst    CH[place] (congestive heart failure) [place]    Edema    Hypertension    Osteoporosis 11/2017   T score -3.4 distal third radius   Pulmonary embolism (HCC)     PAST SURGICAL HISTORY: Past Surgical History:  Procedure Laterality Date   BREAST EXCISIONAL BIOPSY Left    BREAST SURGERY     biopsy-cyst    FAMILY HISTORY: The patient's family history includes Breast cancer (age of onset: 764 in her mother; Diabetes in her brother;  Heart disease (age of onset: 41) in her father.   SOCIAL HISTORY:  The patient  reports that she quit smoking about 28 years ago. Her smoking use included cigarettes. She has a 0.75 pack-year smoking history. She has never used smokeless tobacco. She reports that she does not currently use alcohol. She reports that she does not use drugs.  Review of Systems  Cardiovascular:  Positive for dyspnea on exertion. Negative for chest pain, claudication, leg swelling,  near-syncope, orthopnea, palpitations, paroxysmal nocturnal dyspnea and syncope.    PHYSICAL EXAM:    04/10/2022   10:29 AM 01/27/2022    9:16 AM 09/19/2021   11:42 AM  Vitals with BMI  Height 5' 1.75" 5' 1.75"   Weight 245 lbs 13 oz 258 lbs   BMI 57.84 69.6   Systolic 295 284 132  Diastolic 68 72 58  Pulse 62 58 69   CONSTITUTIONAL: Well-developed and well-nourished. No acute distress.  SKIN: Skin is warm and dry. No rash noted. No cyanosis. No pallor. No jaundice HEAD: Normocephalic and atraumatic.  EYES: No scleral icterus MOUTH/THROAT: Moist oral membranes.  NECK: No JVD present. No thyromegaly noted. No carotid bruits  CHEST Normal respiratory effort. No intercostal retractions  LUNGS: Clear to auscultation bilaterally. No stridor. No wheezes. No rales.  CARDIOVASCULAR: Regular rate and rhythm, positive S1-S2, no murmurs rubs or gallops appreciated. ABDOMINAL: Obese, soft, nontender, nondistended, positive bowel sounds all 4 quadrants. No apparent ascites.  EXTREMITIES: No peripheral edema, warm to touch, palpable DP and PT pulses HEMATOLOGIC: No significant bruising NEUROLOGIC: Oriented to person, place, and time. Nonfocal. Normal muscle tone.  PSYCHIATRIC: Normal mood and affect. Normal behavior. Cooperative  CTA PE Protocol  08/27/2020: 1. Large right-sided pulmonary embolism extending from the right main pulmonary artery into lobar, segmental and subsegmental sized branches in the right lung. 2. Mild cardiomegaly. 3. Aortic atherosclerosis.  CARDIAC DATABASE: EKG: 04/10/2022: Normal sinus rhythm, 63 bpm, without underlying ischemia injury pattern.  Echocardiogram: 08/28/2020:  1. Left ventricular ejection fraction, by estimation, is 65 to 70%. The left ventricle has normal function. The left ventricle has no regional wall motion abnormalities. There is mild left ventricular hypertrophy. Left ventricular diastolic parameters  are consistent with Grade I diastolic  dysfunction (impaired relaxation).   2. Right ventricular systolic function is normal. The right ventricular  size is mildly enlarged. There is mildly elevated pulmonary artery systolic pressure. The estimated right ventricular systolic pressure is  44.0 mmHg.   3. The mitral valve is normal in structure. No evidence of mitral valve regurgitation.   4. The aortic valve is tricuspid. Aortic valve regurgitation is not visualized. No aortic stenosis is present.   5. The inferior vena cava is normal in size with greater than 50% respiratory variability, suggesting right atrial pressure of 3 mmHg.  Stress Testing:  Lexiscan Sestamibi stress test 09/08/2020: 1 Day Rest/Stress Protocol. Stress EKG is non-diagnostic, as this is pharmacological stress test using Lexiscan. Normal myocardial perfusion without evidence of reversible ischemia or prior infarct. Left ventricular size normal. Left ventricular wall thickness is preserved without regional wall motion abnormalities, calculated EF 55%. Compared to prior study 07/27/2017: No significant change.  Low risk study.   Heart Catheterization: None  LABORATORY DATA:    Latest Ref Rng & Units 08/28/2020   11:49 PM 08/28/2020    1:17 AM 08/27/2020    2:30 PM  CBC  WBC 4.0 - 10.5 K/uL 9.2  9.8  9.1   Hemoglobin 12.0 - 15.0  g/dL 11.7  12.0  13.0   Hematocrit 36.0 - 46.0 % 35.7  36.8  40.4   Platelets 150 - 400 K/uL 236  244  229        Latest Ref Rng & Units 11/22/2021   12:00 PM 08/28/2020   11:49 PM 08/28/2020   10:22 AM  CMP  Glucose 70 - 99 mg/dL 92  85  94   BUN 8 - 27 mg/dL '15  22  12   '$ Creatinine 0.57 - 1.00 mg/dL 0.99  0.97  0.96   Sodium 134 - 144 mmol/L 140  135  137   Potassium 3.5 - 5.2 mmol/L 4.5  4.4  4.1   Chloride 96 - 106 mmol/L 105  105  105   CO2 20 - 29 mmol/L '21  22  21   '$ Calcium 8.7 - 10.3 mg/dL 9.7  9.0  9.4     Lipid Panel  No results found for: "CHOL", "TRIG", "HDL", "CHOLHDL", "VLDL", "LDLCALC", "LDLDIRECT",  "LABVLDL"  No results found for: "HGBA1C" No components found for: "NTPROBNP" No results found for: "TSH"  Cardiac Panel (last 3 results) No results for input(s): "CKTOTAL", "CKMB", "TROPONINIHS", "RELINDX" in the last 72 hours.  IMPRESSION:    ICD-10-CM   1. Chronic heart failure with preserved ejection fraction (HFpEF) (HCC)  I50.32 EKG 12-Lead    2. Essential hypertension  I10     3. Hx of Large right-sided pulmonary embolism extending from the right main pulmonary artery into lobar, segmental and subsegmental sized branches in the right lung (08/27/2020)  Z86.711     4. Class 3 severe obesity due to excess calories with serious comorbidity and body mass index (BMI) of 45.0 to 49.9 in adult [facility])  E66.01    Z68.42        RECOMMENDATIONS: OLIA HINDERLITER is a 81 y.o. female whose past medical history and cardiovascular risk factors include: Hx of Large right-sided pulmonary embolism extending from the right main pulmonary artery into lobar, segmental and subsegmental sized branches in the right lung (08/27/2020), aortic atherosclerosis, hypertension, obesity, chronic diastolic heart failure, Former smoker, advanced age, postmenopausal female.  Chronic heart failure with preserved ejection fraction (HFpEF) (HCC) Chronic and stable. Euvolemic. Stage B, NYHA class II. Medications reconciled. Blood pressures are well-controlled Last echo and stress test results reviewed. Overall euvolemic on physical examination. Patient lost 12 pounds since last office visit.  We emphasized importance of improving her modifiable cardiovascular risk factors.  Essential hypertension Office blood pressures are well-controlled. Medications reconciled. No additional changes warranted at this time.  Hx of Large right-sided pulmonary embolism extending from the right main pulmonary artery into lobar, segmental and subsegmental sized branches in the right lung (08/27/2020) Index event April  2022. Remains on Xarelto -managed by PCP.  I have asked her to discuss this further as she has received more than 6 months of therapy.   Class 3 severe obesity due to excess calories with serious comorbidity and body mass index (BMI) of 45.0 to 49.9 in adult [facility]) Body mass index is 45.32 kg/m. I reviewed with the patient the importance of diet, regular physical activity/exercise, weight loss.   Patient is educated on increasing physical activity gradually as tolerated.  With the goal of moderate intensity exercise for 30 minutes a day 5 days a week.  Patient is reinforced the importance of establishing care with pulmonary medicine for multiple reasons.  She does have a history of pulmonary embolism and has been on  oral anticoagulation greater than 6 months (long-term oral anticoagulation needs to be discussed), patient has shortness of breath with effort related activities which is chronic and stable (needs to be evaluated for reactive airway disease/COPD given her history of smoking), and sleep apnea evaluation.  FINAL MEDICATION LIST END OF ENCOUNTER: No orders of the defined types were placed in this encounter.    Current Outpatient Medications:    fexofenadine (ALLEGRA) 180 MG tablet, Take 1 tablet (180 mg total) by mouth daily. (Patient taking differently: Take 180 mg by mouth as needed for allergies.), Disp: 30 tablet, Rfl: 5   furosemide (LASIX) 20 MG tablet, TAKE 1 TABLET(20 MG) BY MOUTH EVERY OTHER DAY, Disp: 30 tablet, Rfl: 3   latanoprost (XALATAN) 0.005 % ophthalmic solution, Place 1 drop into both eyes at bedtime., Disp: , Rfl:    metoprolol succinate (TOPROL-XL) 25 MG 24 hr tablet, TAKE 1 TABLET(25 MG) BY MOUTH DAILY, Disp: 90 tablet, Rfl: 0   Propylene Glycol (SYSTANE BALANCE OP), Apply 1 drop to eye daily., Disp: , Rfl:    sacubitril-valsartan (ENTRESTO) 97-103 MG, Take 1 tablet by mouth daily. , Disp: , Rfl:    spironolactone (ALDACTONE) 25 MG tablet, TAKE 1 TABLET(25 MG) BY  MOUTH EVERY MORNING, Disp: 90 tablet, Rfl: 0   XARELTO 20 MG TABS tablet, Take 20 mg by mouth daily., Disp: , Rfl:   Current Facility-Administered Medications:    omalizumab Arvid Right) injection 300 mg, 300 mg, Subcutaneous, Q28 days, Kennith Gain, MD, 300 mg at 03/20/22 1128  Orders Placed This Encounter  Procedures   EKG 12-Lead   --Continue cardiac medications as reconciled in final medication list. --Return in about 6 months (around 10/10/2022) for Follow up, heart failure management.. Or sooner if needed. --Continue follow-up with your primary care physician regarding the management of your other chronic comorbid conditions.  Patient's questions and concerns were addressed to her satisfaction. She voices understanding of the instructions provided during this encounter.   This note was created using a voice recognition software as a result there may be grammatical errors inadvertently enclosed that do not reflect the nature of this encounter. Every attempt is made to correct such errors.  Rex Kras, [place], [facility]  Pager: [phone] Office: [phone]

## 2022-04-12 ENCOUNTER — Telehealth: Payer: Self-pay | Admitting: Oncology

## 2022-04-12 NOTE — Telephone Encounter (Signed)
Attempted to contact patient in regards to referral, no answer so voicemail was left for patient to call back

## 2022-04-14 ENCOUNTER — Telehealth: Payer: Self-pay | Admitting: Oncology

## 2022-04-14 NOTE — Telephone Encounter (Signed)
Attempted to contact patient in regards to referral, no answer so voicemail was left for patient to call back

## 2022-04-17 ENCOUNTER — Ambulatory Visit (INDEPENDENT_AMBULATORY_CARE_PROVIDER_SITE_OTHER): Payer: Medicare Other

## 2022-04-17 DIAGNOSIS — L501 Idiopathic urticaria: Secondary | ICD-10-CM

## 2022-04-18 ENCOUNTER — Other Ambulatory Visit: Payer: Self-pay | Admitting: Cardiology

## 2022-04-18 DIAGNOSIS — I5032 Chronic diastolic (congestive) heart failure: Secondary | ICD-10-CM

## 2022-05-02 ENCOUNTER — Inpatient Hospital Stay: Payer: Medicare Other | Attending: Oncology | Admitting: Oncology

## 2022-05-02 ENCOUNTER — Inpatient Hospital Stay: Payer: Medicare Other

## 2022-05-02 VITALS — BP 143/67 | HR 88 | Temp 98.2°F | Resp 18 | Ht 63.0 in | Wt 254.6 lb

## 2022-05-02 DIAGNOSIS — I2694 Multiple subsegmental pulmonary emboli without acute cor pulmonale: Secondary | ICD-10-CM | POA: Diagnosis not present

## 2022-05-02 NOTE — Progress Notes (Signed)
[facility] New Patient Consult   Requesting MD: Chesley Mires, [address],  [place] [address]   Kimberly Crosby 81 y.o.  03-30-1941    Reason for Consult: Pulmonary embolism   HPI: Kimberly Crosby developed dyspnea in April 2022.  She reports the dyspnea progressed over approximately 1 week prior to seeking care with her primary provider.  A D-dimer was elevated and she was referred to the emergency room.  A CT chest on 08/27/2020 confirmed a large right-sided pulmonary embolism extending into the right main pulmonary artery and lobar, segmental, and subsegmental branches. She was admitted.  Bilateral lower extremity Dopplers on 08/28/2020 were negative for DVT.  She was placed on heparin anticoagulation followed by Xarelto.  She reports no immobility or trauma prior to the pulmonary embolism diagnosis.  No previous history of venous or arterial thrombosis.  She reports being up-to-date on mammography and colonoscopy screening.  Kimberly Crosby has continued Xarelto anticoagulation.  She denies symptoms of recurrent venous thrombosis.  No bleeding.  She bruises easily.  She saw Dr. Halford Chessman 04/10/2022 evaluation of exertional dyspnea and the pulmonary embolism.  She is referred to hematology to consider the indication for a hypercoagulation evaluation.  Past Medical History:  Diagnosis Date   Angioedema    Asthma    Breast cyst    CHF (congestive heart failure) (HCC)    Chronic idiopathic urticaria    Edema    Hypertension    OSA (obstructive sleep apnea)    Osteoporosis 11/2017   T score -3.4 distal third radius   Pulmonary embolism ([facility]) 08/27/2020    .  G4, P3, 1 miscarriage  Past Surgical History:  Procedure Laterality Date   BREAST EXCISIONAL BIOPSY Left 1985   BREAST SURGERY     biopsy-cyst    Medications: Reviewed  Allergies: No Known Allergies  Family history: Her mother had breast cancer.  No family history of venous or arterial  thrombosis.  Social History:   She lives alone in [place].  She is a retired Marine scientist.  She works at the [employer] and [employer].  She does not use use cigarettes.  She quit smoking greater than 25 years ago.  She drinks wine every 3 to 4 months.  No transfusion history.  No risk factor for HIV or hepatitis.  ROS:   Positives include: None  A complete ROS was otherwise negative.  Physical Exam:  Blood pressure (!) 143/67, pulse 88, temperature 98.2 F (36.8 C), temperature source Oral, resp. rate 18, height '5\' 3"'$  (1.6 m), weight 254 lb 9.6 oz (115.5 kg), SpO2 100 %.  HEENT: Oropharynx without visible mass, neck without mass Lungs: Clear bilaterally, no respiratory distress Cardiac: Regular rate and rhythm Abdomen: No hepatosplenomegaly, no mass, nontender  Vascular: No leg edema Lymph nodes: No cervical, supraclavicular, axillary, or inguinal nodes Neurologic: Alert and oriented Skin: Moisture with mild hyperpigmentation in the groin bilaterally Musculoskeletal: No spine tenderness   LAB:  CBC  Lab Results  Component Value Date   WBC 9.2 08/28/2020   HGB 11.7 (L) 08/28/2020   HCT 35.7 (L) 08/28/2020   MCV 90.4 08/28/2020   PLT 236 08/28/2020   NEUTROABS 6.0 08/27/2020        CMP  Lab Results  Component Value Date   NA 140 11/22/2021   K 4.5 11/22/2021   CL 105 11/22/2021   CO2 21 11/22/2021   GLUCOSE 92 11/22/2021   BUN 15 11/22/2021  CREATININE 0.99 11/22/2021   CALCIUM 9.7 11/22/2021   PROT 7.8 06/30/2015   ALBUMIN 3.8 06/30/2015   AST 20 06/30/2015   ALT 15 06/30/2015   ALKPHOS 77 06/30/2015   BILITOT 0.5 06/30/2015   GFRNONAA 59 (L) 08/28/2020   GFRAA >60 06/30/2015     Lab Results  Component Value Date   CA125 5 01/28/2016    Imaging: As per HPI    Assessment/Plan:   Pulmonary embolism 08/27/2020 CT chest 08/27/2020-large filling defect in the distal right main pulmonary artery extending to lobar, segmental, and subsegmental  branches in the right lung Admission 08/27/2020-heparin followed by Xarelto anticoagulation Bilateral lower extremity Dopplers 08/28/2020-negative for deep vein thrombosis Asthma Angioedema/urticaria Sleep apnea CHF Hypertension Obesity   Disposition:   Kimberly Crosby is diagnosed with a large right-sided pulmonary embolism and April 2022.  She continues Xarelto anticoagulation.  The pulmonary embolism "unprovoked".  She had no apparent risk factor for venous thrombosis aside from obesity.  Her persistent risk factors include the history of pulmonary embolism and obesity.  I recommend indefinite anticoagulation given these history of a large pulmonary embolism and persistent obesity.  I do not recommend a hypercoagulation panel as results will not change this recommendation.  We discussed the indication for reduced intensity anticoagulation.  I recommend continuing full dose anticoagulation since she appears to be tolerating the Xarelto well and with the persistent obesity.  She is not scheduled for a follow-up appointment in the hematology clinic.  I am available to see her in the future as needed.  She plans to continue clinical follow-up with Dr. Criss Rosales, Dr. Halford Chessman, and Dr. Terri Skains.  Betsy Coder, MD  05/02/2022, 3:40 PM

## 2022-05-03 ENCOUNTER — Emergency Department (HOSPITAL_COMMUNITY): Payer: No Typology Code available for payment source

## 2022-05-03 ENCOUNTER — Emergency Department (HOSPITAL_COMMUNITY)
Admission: EM | Admit: 2022-05-03 | Discharge: 2022-05-04 | Disposition: A | Discharge status: Home / Self Care | Payer: No Typology Code available for payment source | Attending: Emergency Medicine | Admitting: Emergency Medicine

## 2022-05-03 DIAGNOSIS — I10 Essential (primary) hypertension: Secondary | ICD-10-CM | POA: Insufficient documentation

## 2022-05-03 DIAGNOSIS — J45909 Unspecified asthma, uncomplicated: Secondary | ICD-10-CM | POA: Diagnosis not present

## 2022-05-03 DIAGNOSIS — S12100A Unspecified displaced fracture of second cervical vertebra, initial encounter for closed fracture: Secondary | ICD-10-CM | POA: Diagnosis not present

## 2022-05-03 DIAGNOSIS — S12190A Other displaced fracture of second cervical vertebra, initial encounter for closed fracture: Secondary | ICD-10-CM | POA: Insufficient documentation

## 2022-05-03 DIAGNOSIS — Z7901 Long term (current) use of anticoagulants: Secondary | ICD-10-CM | POA: Diagnosis not present

## 2022-05-03 DIAGNOSIS — I6789 Other cerebrovascular disease: Secondary | ICD-10-CM | POA: Insufficient documentation

## 2022-05-03 DIAGNOSIS — S098XXA Other specified injuries of head, initial encounter: Secondary | ICD-10-CM | POA: Diagnosis not present

## 2022-05-03 DIAGNOSIS — Y92410 Unspecified street and highway as the place of occurrence of the external cause: Secondary | ICD-10-CM | POA: Insufficient documentation

## 2022-05-03 DIAGNOSIS — I110 Hypertensive heart disease with heart failure: Secondary | ICD-10-CM | POA: Insufficient documentation

## 2022-05-03 DIAGNOSIS — S3993XA Unspecified injury of pelvis, initial encounter: Secondary | ICD-10-CM | POA: Diagnosis not present

## 2022-05-03 DIAGNOSIS — I6503 Occlusion and stenosis of bilateral vertebral arteries: Secondary | ICD-10-CM | POA: Diagnosis not present

## 2022-05-03 DIAGNOSIS — R791 Abnormal coagulation profile: Secondary | ICD-10-CM | POA: Diagnosis not present

## 2022-05-03 DIAGNOSIS — Z79899 Other long term (current) drug therapy: Secondary | ICD-10-CM | POA: Insufficient documentation

## 2022-05-03 DIAGNOSIS — M4802 Spinal stenosis, cervical region: Secondary | ICD-10-CM | POA: Diagnosis not present

## 2022-05-03 DIAGNOSIS — S299XXA Unspecified injury of thorax, initial encounter: Secondary | ICD-10-CM | POA: Diagnosis not present

## 2022-05-03 DIAGNOSIS — S14109A Unspecified injury at unspecified level of cervical spinal cord, initial encounter: Secondary | ICD-10-CM | POA: Diagnosis present

## 2022-05-03 DIAGNOSIS — I509 Heart failure, unspecified: Secondary | ICD-10-CM | POA: Diagnosis not present

## 2022-05-03 LAB — CBC
HCT: 40.5 % (ref 36.0–46.0)
Hemoglobin: 12.8 g/dL (ref 12.0–15.0)
MCH: 29.6 pg (ref 26.0–34.0)
MCHC: 31.6 g/dL (ref 30.0–36.0)
MCV: 93.8 fL (ref 80.0–100.0)
Platelets: 255 10*3/uL (ref 150–400)
RBC: 4.32 MIL/uL (ref 3.87–5.11)
RDW: 14.1 % (ref 11.5–15.5)
WBC: 9 10*3/uL (ref 4.0–10.5)
nRBC: 0 % (ref 0.0–0.2)

## 2022-05-03 LAB — COMPREHENSIVE METABOLIC PANEL
ALT: 11 U/L (ref 0–44)
AST: 17 U/L (ref 15–41)
Albumin: 3.4 g/dL — ABNORMAL LOW (ref 3.5–5.0)
Alkaline Phosphatase: 78 U/L (ref 38–126)
Anion gap: 7 (ref 5–15)
BUN: 13 mg/dL (ref 8–23)
CO2: 19 mmol/L — ABNORMAL LOW (ref 22–32)
Calcium: 9.3 mg/dL (ref 8.9–10.3)
Chloride: 113 mmol/L — ABNORMAL HIGH (ref 98–111)
Creatinine, Ser: 1.08 mg/dL — ABNORMAL HIGH (ref 0.44–1.00)
GFR, Estimated: 52 mL/min — ABNORMAL LOW (ref >60)
Glucose, Bld: 106 mg/dL — ABNORMAL HIGH (ref 70–99)
Potassium: 4.2 mmol/L (ref 3.5–5.1)
Sodium: 139 mmol/L (ref 135–145)
Total Bilirubin: 0.7 mg/dL (ref 0.3–1.2)
Total Protein: 7.1 g/dL (ref 6.5–8.1)

## 2022-05-03 LAB — PROTIME-INR
INR: 1.4 — ABNORMAL HIGH (ref 0.8–1.2)
Prothrombin Time: 16.6 seconds — ABNORMAL HIGH (ref 11.4–15.2)

## 2022-05-03 LAB — I-STAT CHEM 8, ED
BUN: 14 mg/dL (ref 8–23)
BUN: 15 mg/dL (ref 8–23)
Calcium, Ion: 1.03 mmol/L — ABNORMAL LOW (ref 1.15–1.40)
Calcium, Ion: 1.19 mmol/L (ref 1.15–1.40)
Chloride: 109 mmol/L (ref 98–111)
Chloride: 108 mmol/L (ref 98–111)
Creatinine, Ser: 1 mg/dL (ref 0.44–1.00)
Creatinine, Ser: 0.9 mg/dL (ref 0.44–1.00)
Glucose, Bld: 116 mg/dL — ABNORMAL HIGH (ref 70–99)
Glucose, Bld: 104 mg/dL — ABNORMAL HIGH (ref 70–99)
HCT: 38 % (ref 36.0–46.0)
HCT: 39 % (ref 36.0–46.0)
Hemoglobin: 12.9 g/dL (ref 12.0–15.0)
Hemoglobin: 13.3 g/dL (ref 12.0–15.0)
Potassium: 3.9 mmol/L (ref 3.5–5.1)
Potassium: 4 mmol/L (ref 3.5–5.1)
Sodium: 141 mmol/L (ref 135–145)
Sodium: 143 mmol/L (ref 135–145)
TCO2: 22 mmol/L (ref 22–32)
TCO2: 23 mmol/L (ref 22–32)

## 2022-05-03 LAB — URINALYSIS, ROUTINE W REFLEX MICROSCOPIC
Bilirubin Urine: NEGATIVE
Glucose, UA: NEGATIVE mg/dL
Hgb urine dipstick: NEGATIVE
Ketones, ur: NEGATIVE mg/dL
Leukocytes,Ua: NEGATIVE
Nitrite: NEGATIVE
Protein, ur: NEGATIVE mg/dL
Specific Gravity, Urine: 1.028 (ref 1.005–1.030)
pH: 6 (ref 5.0–8.0)

## 2022-05-03 LAB — LACTIC ACID, PLASMA: Lactic Acid, Venous: 1.1 mmol/L (ref 0.5–1.9)

## 2022-05-03 LAB — SAMPLE TO BLOOD BANK

## 2022-05-03 MED ORDER — IOHEXOL 350 MG/ML SOLN
75.0000 mL | Freq: Once | INTRAVENOUS | Status: AC | PRN
  Administered 2022-05-03: 75 mL via INTRAVENOUS

## 2022-05-03 MED ORDER — ACETAMINOPHEN 500 MG PO TABS
1000.0000 mg | ORAL_TABLET | Freq: Once | ORAL | Status: AC
  Administered 2022-05-03: 1000 mg via ORAL
  Filled 2022-05-03: qty 2
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
## 2022-05-03 NOTE — Consult Note (Signed)
Full consult note to follow, CT C-spine reviewed, shows C2 lateral mass / unilateral Hangman's that involves the F transversarium.  -rigid cervical collar, no surgical intervention planned -okay to continue anticoagulation from my standpoint, recently diagnosed PEs -CTA pending -full recs to follow, will update accordingly
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
## 2022-05-04 ENCOUNTER — Other Ambulatory Visit: Payer: Self-pay

## 2022-05-04 DIAGNOSIS — S12190A Other displaced fracture of second cervical vertebra, initial encounter for closed fracture: Secondary | ICD-10-CM | POA: Diagnosis not present

## 2022-05-04 MED ORDER — METHOCARBAMOL 500 MG PO TABS: 500.0000 mg | ORAL_TABLET | Freq: Three times a day (TID) | ORAL | 0 refills | Status: DC | PRN

## 2022-05-04 MED ORDER — OXYCODONE HCL 5 MG PO TABS: 2.5000 mg | ORAL_TABLET | Freq: Four times a day (QID) | ORAL | 0 refills | Status: AC | PRN

## 2022-05-04 NOTE — Discharge Instructions (Addendum)
For pain control you may take 1000 mg of Tylenol every 8 hours scheduled.  In addition you can take 0.5 to 1 tablet of Oxycodone every 6 hours as needed for pain not controlled with the scheduled Tylenol.  

## 2022-05-06 DIAGNOSIS — I110 Hypertensive heart disease with heart failure: Secondary | ICD-10-CM | POA: Diagnosis not present

## 2022-05-06 DIAGNOSIS — I5032 Chronic diastolic (congestive) heart failure: Secondary | ICD-10-CM | POA: Diagnosis not present

## 2022-05-06 DIAGNOSIS — E785 Hyperlipidemia, unspecified: Secondary | ICD-10-CM | POA: Diagnosis not present

## 2022-05-09 DIAGNOSIS — L89142 Pressure ulcer of left lower back, stage 2: Secondary | ICD-10-CM | POA: Diagnosis not present

## 2022-05-09 DIAGNOSIS — L89131 Pressure ulcer of right lower back, stage 1: Secondary | ICD-10-CM | POA: Diagnosis not present

## 2022-05-09 DIAGNOSIS — L89320 Pressure ulcer of left buttock, unstageable: Secondary | ICD-10-CM | POA: Diagnosis not present

## 2022-05-09 DIAGNOSIS — Z6841 Body Mass Index (BMI) 40.0 and over, adult: Secondary | ICD-10-CM | POA: Diagnosis not present

## 2022-05-09 DIAGNOSIS — R1084 Generalized abdominal pain: Secondary | ICD-10-CM | POA: Diagnosis not present

## 2022-05-09 DIAGNOSIS — L89309 Pressure ulcer of unspecified buttock, unspecified stage: Secondary | ICD-10-CM | POA: Diagnosis not present

## 2022-05-12 DIAGNOSIS — S12100D Unspecified displaced fracture of second cervical vertebra, subsequent encounter for fracture with routine healing: Secondary | ICD-10-CM | POA: Diagnosis not present

## 2022-05-15 ENCOUNTER — Ambulatory Visit: Payer: Medicare Other

## 2022-05-18 ENCOUNTER — Ambulatory Visit: Payer: Medicare Other

## 2022-05-18 DIAGNOSIS — L501 Idiopathic urticaria: Secondary | ICD-10-CM

## 2022-05-31 ENCOUNTER — Other Ambulatory Visit: Payer: Self-pay | Admitting: Cardiology

## 2022-05-31 DIAGNOSIS — I5032 Chronic diastolic (congestive) heart failure: Secondary | ICD-10-CM

## 2022-06-12 ENCOUNTER — Ambulatory Visit (INDEPENDENT_AMBULATORY_CARE_PROVIDER_SITE_OTHER): Payer: Medicare Other | Admitting: Pulmonary Disease

## 2022-06-12 ENCOUNTER — Encounter (HOSPITAL_BASED_OUTPATIENT_CLINIC_OR_DEPARTMENT_OTHER): Payer: Self-pay | Admitting: Pulmonary Disease

## 2022-06-12 VITALS — BP 110/60 | HR 85 | Temp 97.9°F | Ht 62.0 in | Wt 249.0 lb

## 2022-06-12 DIAGNOSIS — R0609 Other forms of dyspnea: Secondary | ICD-10-CM | POA: Diagnosis not present

## 2022-06-12 LAB — PULMONARY FUNCTION TEST
DL/VA % pred: 118 %
DL/VA: 4.88 ml/min/mmHg/L
DLCO cor % pred: 98 %
DLCO cor: 17.22 ml/min/mmHg
DLCO unc % pred: 96 %
DLCO unc: 16.89 ml/min/mmHg
FEF 25-75 Post: 1.86 L/sec
FEF 25-75 Pre: 2.21 L/sec
FEF2575-%Change-Post: -15 %
FEF2575-%Pred-Post: 149 %
FEF2575-%Pred-Pre: 177 %
FEV1-%Change-Post: 0 %
FEV1-%Pred-Post: 105 %
FEV1-%Pred-Pre: 105 %
FEV1-Post: 1.81 L
FEV1-Pre: 1.81 L
FEV1FVC-%Change-Post: -5 %
FEV1FVC-%Pred-Pre: 115 %
FEV6-%Change-Post: 7 %
FEV6-%Pred-Post: 102 %
FEV6-%Pred-Pre: 95 %
FEV6-Post: 2.25 L
FEV6-Pre: 2.1 L
FEV6FVC-%Pred-Post: 106 %
FEV6FVC-%Pred-Pre: 106 %
FVC-%Change-Post: 5 %
FVC-%Pred-Post: 96 %
FVC-%Pred-Pre: 91 %
FVC-Post: 2.25 L
FVC-Pre: 2.13 L
Post FEV1/FVC ratio: 81 %
Post FEV6/FVC ratio: 100 %
Pre FEV1/FVC ratio: 85 %
Pre FEV6/FVC Ratio: 100 %
RV % pred: 83 %
RV: 1.92 L
TLC % pred: 86 %
TLC: 4.09 L

## 2022-06-12 NOTE — Progress Notes (Signed)
Full PFT Performed Today. 

## 2022-06-12 NOTE — Patient Instructions (Signed)
Full PFT Performed Today. 

## 2022-06-12 NOTE — Progress Notes (Signed)
[facility]  Chief Complaint  Patient presents with   Follow-up    PFT results    Past Surgical History:  She  has a past surgical history that includes Breast surgery and Breast excisional biopsy (Left).  Past Medical History:  HTN, HFpEF, HLD, Chronic idiopathic urticaria and angioedema, Osteoporosis, OSA, PE April 2022  Constitutional:  BP 110/60 (BP Location: Left Wrist, Cuff Size: Normal)   Pulse 85   Temp 97.9 F (36.6 C) (Oral)   Ht '5\' 2"'$  (1.575 m)   Wt 249 lb (112.9 kg)   SpO2 97%   BMI 45.54 kg/m   Brief Summary:  Kimberly Crosby is a 82 y.o. female former smoker with dyspnea.      Subjective:   PFT today was normal.  She has been using Breztri and this helps.  Not having cough, wheeze, or sputum.  Uses CPAP at night.  Saw hematology and recommended indefinite anticoagulation therapy.  Physical Exam:   Appearance - well kempt   ENMT - no sinus tenderness, no oral exudate, no LAN, Mallampati 3 airway, no stridor  Respiratory - equal breath sounds bilaterally, no wheezing or rales  CV - s1s2 regular rate and rhythm, no murmurs  Ext - no clubbing, no edema  Skin - no rashes  Psych - normal mood and affect   Pulmonary testing:  PFT 06/12/22 >> FEV1 2.25 ( 96%), FEV1% 81, TLC 4.09 (86%), DLCO 96%  Chest Imaging:  CT angio chest 08/27/20 >> Large right-sided pulmonary embolism extending from the right main pulmonary artery into lobar, segmental and subsegmental sized branches in the right lung  Sleep Tests:    Cardiac Tests:  Echo 08/28/20 >> EF 65 to 70%, mild LVH, grade 1 DD, RVSP 34.6 mmHg  Social History:  She  reports that she quit smoking about 28 years ago. Her smoking use included cigarettes. She has a 0.75 pack-year smoking history. She has never used smokeless tobacco. She reports that she does not currently use alcohol. She reports that she does not use drugs.  Family History:  Her family  history includes Breast cancer (age of onset: 45) in her mother; Diabetes in her brother; Heart disease (age of onset: 28) in her father.     Assessment/Plan:   Dyspnea on exertion. - likely related to obesity and relative deconditioning, and asthma - discussed importance of weight loss - continue Breztri for now - she is followed by cardiology for HFpEF  Unprovoked pulmonary embolism from April 2022. - she is continue on indefinite anticoagulation therapy; gets xarelto refills from her PCP  Chronic idiopathic urticaria and angioedema. - followed by Dr. Nelva Bush with allergy and immunology and treated with xolair injections  Obstructive sleep apnea. - management by her PCP  Time Spent Involved in Patient Care on Day of Examination:  27 minutes  Follow up:   Patient Instructions  Follow up in 6 months  Medication List:   Allergies as of 06/12/2022   No Known Allergies      Medication List        Accurate as of June 12, 2022  3:30 PM. If you have any questions, ask your nurse or doctor.          Breztri Aerosphere 160-9-4.8 MCG/ACT Aero Generic drug: Budeson-Glycopyrrol-Formoterol Inhale 2 puffs into the lungs in the morning and at bedtime.   fexofenadine 180 MG tablet Commonly known as: ALLEGRA Take 1 tablet (180 mg total) by mouth  daily. What changed:  when to take this reasons to take this   furosemide 20 MG tablet Commonly known as: LASIX TAKE 1 TABLET(20 MG) BY MOUTH EVERY OTHER DAY   latanoprost 0.005 % ophthalmic solution Commonly known as: XALATAN Place 1 drop into both eyes at bedtime.   methocarbamol 500 MG tablet Commonly known as: ROBAXIN Take 1 tablet (500 mg total) by mouth every 8 (eight) hours as needed for muscle spasms.   metoprolol succinate 25 MG 24 hr tablet Commonly known as: TOPROL-XL TAKE 1 TABLET(25 MG) BY MOUTH DAILY   sacubitril-valsartan 97-103 MG Commonly known as: ENTRESTO Take 1 tablet by mouth daily.    spironolactone 25 MG tablet Commonly known as: ALDACTONE TAKE 1 TABLET(25 MG) BY MOUTH EVERY MORNING   SYSTANE BALANCE OP Apply 1 drop to eye daily.   Xarelto 20 MG Tabs tablet Generic drug: rivaroxaban Take 20 mg by mouth daily.        Signature:  Chesley Mires, MD [place] Pager - [phone] 06/12/2022, 3:30 PM

## 2022-06-12 NOTE — Patient Instructions (Signed)
Follow up in 6 months 

## 2022-06-15 ENCOUNTER — Ambulatory Visit (INDEPENDENT_AMBULATORY_CARE_PROVIDER_SITE_OTHER): Payer: Medicare Other

## 2022-06-15 DIAGNOSIS — L501 Idiopathic urticaria: Secondary | ICD-10-CM

## 2022-06-21 DIAGNOSIS — M7551 Bursitis of right shoulder: Secondary | ICD-10-CM | POA: Diagnosis not present

## 2022-06-21 DIAGNOSIS — S12100D Unspecified displaced fracture of second cervical vertebra, subsequent encounter for fracture with routine healing: Secondary | ICD-10-CM | POA: Diagnosis not present

## 2022-07-13 ENCOUNTER — Ambulatory Visit (INDEPENDENT_AMBULATORY_CARE_PROVIDER_SITE_OTHER): Payer: Medicare Other

## 2022-07-13 DIAGNOSIS — L501 Idiopathic urticaria: Secondary | ICD-10-CM | POA: Diagnosis not present

## 2022-07-25 DIAGNOSIS — R7309 Other abnormal glucose: Secondary | ICD-10-CM | POA: Diagnosis not present

## 2022-07-25 DIAGNOSIS — E7800 Pure hypercholesterolemia, unspecified: Secondary | ICD-10-CM | POA: Diagnosis not present

## 2022-07-25 DIAGNOSIS — R269 Unspecified abnormalities of gait and mobility: Secondary | ICD-10-CM | POA: Diagnosis not present

## 2022-07-25 DIAGNOSIS — E1169 Type 2 diabetes mellitus with other specified complication: Secondary | ICD-10-CM | POA: Diagnosis not present

## 2022-07-25 DIAGNOSIS — I110 Hypertensive heart disease with heart failure: Secondary | ICD-10-CM | POA: Diagnosis not present

## 2022-07-25 DIAGNOSIS — E730 Congenital lactase deficiency: Secondary | ICD-10-CM | POA: Diagnosis not present

## 2022-08-02 ENCOUNTER — Other Ambulatory Visit: Payer: Self-pay | Admitting: Cardiology

## 2022-08-02 DIAGNOSIS — I5032 Chronic diastolic (congestive) heart failure: Secondary | ICD-10-CM

## 2022-08-09 DIAGNOSIS — M7551 Bursitis of right shoulder: Secondary | ICD-10-CM | POA: Diagnosis not present

## 2022-08-10 ENCOUNTER — Ambulatory Visit (INDEPENDENT_AMBULATORY_CARE_PROVIDER_SITE_OTHER): Payer: Medicare Other

## 2022-08-10 DIAGNOSIS — L501 Idiopathic urticaria: Secondary | ICD-10-CM | POA: Diagnosis not present

## 2022-08-22 ENCOUNTER — Ambulatory Visit: Payer: Medicare Other | Attending: Physician Assistant

## 2022-08-22 ENCOUNTER — Other Ambulatory Visit: Payer: Self-pay

## 2022-08-22 DIAGNOSIS — M6281 Muscle weakness (generalized): Secondary | ICD-10-CM | POA: Diagnosis not present

## 2022-08-22 DIAGNOSIS — M25511 Pain in right shoulder: Secondary | ICD-10-CM | POA: Insufficient documentation

## 2022-08-22 DIAGNOSIS — R252 Cramp and spasm: Secondary | ICD-10-CM | POA: Diagnosis not present

## 2022-08-22 DIAGNOSIS — M25611 Stiffness of right shoulder, not elsewhere classified: Secondary | ICD-10-CM | POA: Insufficient documentation
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
## 2022-08-25 DIAGNOSIS — E1169 Type 2 diabetes mellitus with other specified complication: Secondary | ICD-10-CM | POA: Diagnosis not present

## 2022-08-25 DIAGNOSIS — R52 Pain, unspecified: Secondary | ICD-10-CM | POA: Diagnosis not present

## 2022-08-28 ENCOUNTER — Ambulatory Visit: Payer: Medicare Other

## 2022-08-28 DIAGNOSIS — M6281 Muscle weakness (generalized): Secondary | ICD-10-CM

## 2022-08-28 DIAGNOSIS — M25611 Stiffness of right shoulder, not elsewhere classified: Secondary | ICD-10-CM | POA: Diagnosis not present

## 2022-08-28 DIAGNOSIS — M25511 Pain in right shoulder: Secondary | ICD-10-CM

## 2022-08-28 DIAGNOSIS — R252 Cramp and spasm: Secondary | ICD-10-CM | POA: Diagnosis not present
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
## 2022-08-31 ENCOUNTER — Encounter: Payer: Self-pay | Admitting: Physical Therapy

## 2022-08-31 ENCOUNTER — Ambulatory Visit: Payer: Medicare Other | Admitting: Physical Therapy

## 2022-08-31 DIAGNOSIS — M25611 Stiffness of right shoulder, not elsewhere classified: Secondary | ICD-10-CM | POA: Diagnosis not present

## 2022-08-31 DIAGNOSIS — M25511 Pain in right shoulder: Secondary | ICD-10-CM | POA: Diagnosis not present

## 2022-08-31 DIAGNOSIS — M6281 Muscle weakness (generalized): Secondary | ICD-10-CM | POA: Diagnosis not present

## 2022-08-31 DIAGNOSIS — R252 Cramp and spasm: Secondary | ICD-10-CM | POA: Diagnosis not present
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
## 2022-09-05 DIAGNOSIS — I110 Hypertensive heart disease with heart failure: Secondary | ICD-10-CM | POA: Diagnosis not present

## 2022-09-05 DIAGNOSIS — I5032 Chronic diastolic (congestive) heart failure: Secondary | ICD-10-CM | POA: Diagnosis not present

## 2022-09-05 DIAGNOSIS — E785 Hyperlipidemia, unspecified: Secondary | ICD-10-CM | POA: Diagnosis not present

## 2022-09-06 ENCOUNTER — Ambulatory Visit: Payer: Medicare Other | Attending: Physician Assistant

## 2022-09-06 DIAGNOSIS — M25611 Stiffness of right shoulder, not elsewhere classified: Secondary | ICD-10-CM | POA: Insufficient documentation

## 2022-09-06 DIAGNOSIS — M25511 Pain in right shoulder: Secondary | ICD-10-CM | POA: Diagnosis not present

## 2022-09-06 DIAGNOSIS — R293 Abnormal posture: Secondary | ICD-10-CM | POA: Diagnosis not present

## 2022-09-06 DIAGNOSIS — R252 Cramp and spasm: Secondary | ICD-10-CM | POA: Diagnosis not present

## 2022-09-06 DIAGNOSIS — M6281 Muscle weakness (generalized): Secondary | ICD-10-CM | POA: Diagnosis not present
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
## 2022-09-08 ENCOUNTER — Ambulatory Visit: Payer: Medicare Other

## 2022-09-08 DIAGNOSIS — M25511 Pain in right shoulder: Secondary | ICD-10-CM | POA: Diagnosis not present

## 2022-09-08 DIAGNOSIS — M25611 Stiffness of right shoulder, not elsewhere classified: Secondary | ICD-10-CM

## 2022-09-08 DIAGNOSIS — R252 Cramp and spasm: Secondary | ICD-10-CM | POA: Diagnosis not present

## 2022-09-08 DIAGNOSIS — M6281 Muscle weakness (generalized): Secondary | ICD-10-CM

## 2022-09-08 DIAGNOSIS — R293 Abnormal posture: Secondary | ICD-10-CM
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
## 2022-09-13 ENCOUNTER — Ambulatory Visit: Payer: Medicare Other

## 2022-09-13 DIAGNOSIS — R252 Cramp and spasm: Secondary | ICD-10-CM

## 2022-09-13 DIAGNOSIS — R293 Abnormal posture: Secondary | ICD-10-CM | POA: Diagnosis not present

## 2022-09-13 DIAGNOSIS — M25511 Pain in right shoulder: Secondary | ICD-10-CM | POA: Diagnosis not present

## 2022-09-13 DIAGNOSIS — M6281 Muscle weakness (generalized): Secondary | ICD-10-CM | POA: Diagnosis not present

## 2022-09-13 DIAGNOSIS — M25611 Stiffness of right shoulder, not elsewhere classified: Secondary | ICD-10-CM | POA: Diagnosis not present
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
## 2022-09-14 ENCOUNTER — Ambulatory Visit: Payer: Medicare Other

## 2022-09-14 DIAGNOSIS — M25611 Stiffness of right shoulder, not elsewhere classified: Secondary | ICD-10-CM

## 2022-09-14 DIAGNOSIS — M6281 Muscle weakness (generalized): Secondary | ICD-10-CM

## 2022-09-14 DIAGNOSIS — M25511 Pain in right shoulder: Secondary | ICD-10-CM | POA: Diagnosis not present

## 2022-09-14 DIAGNOSIS — R293 Abnormal posture: Secondary | ICD-10-CM | POA: Diagnosis not present

## 2022-09-14 DIAGNOSIS — R252 Cramp and spasm: Secondary | ICD-10-CM
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
## 2022-09-15 ENCOUNTER — Ambulatory Visit: Payer: Federal, State, Local not specified - PPO | Admitting: Allergy

## 2022-09-15 ENCOUNTER — Ambulatory Visit (INDEPENDENT_AMBULATORY_CARE_PROVIDER_SITE_OTHER): Payer: Medicare Other | Admitting: *Deleted

## 2022-09-15 ENCOUNTER — Encounter: Payer: Self-pay | Admitting: Allergy

## 2022-09-15 VITALS — BP 124/60 | HR 67 | Temp 98.3°F | Resp 20

## 2022-09-15 DIAGNOSIS — T783XXD Angioneurotic edema, subsequent encounter: Secondary | ICD-10-CM

## 2022-09-15 DIAGNOSIS — R0602 Shortness of breath: Secondary | ICD-10-CM

## 2022-09-15 DIAGNOSIS — J3089 Other allergic rhinitis: Secondary | ICD-10-CM

## 2022-09-15 DIAGNOSIS — L501 Idiopathic urticaria: Secondary | ICD-10-CM | POA: Diagnosis not present

## 2022-09-15 MED ORDER — EPINEPHRINE 0.3 MG/0.3ML IJ SOAJ: 0.3000 mg | INTRAMUSCULAR | 1 refills | Status: AC | PRN

## 2022-09-15 NOTE — Progress Notes (Signed)
Follow-up Note  RE: Kimberly Crosby MRN: [id number] DOB: April 03, 1941 Date of Office Visit: 09/15/2022   History of present illness: Kimberly Crosby is a 82 y.o. female presenting today for follow-up of chronic hives and swelling with allergic rhinitis.  She was last seen in the office for a visit on 10/19/2016 by myself. She returns today as she states she has had a flare of her hives.  Since her last visit she has been on Xolair injections once a month which has been controlling her hives and swelling quite well.  She states last month is the first time she has had an outbreak of hives since being on Xolair.  She states the hives would come and last for couple of days then go away.  When they started she was not on any allergy based medications but she did start taking Allegra once a day.  She is no longer taking an H2 blocker or Singulair.  She has been tolerating her Xolair injections well without large local or systemic reactions. Since her last visit she did have pneumonia earlier in the year and thus developed shortness of breath.  She was sent to see pulmonologist Dr Craige Cotta for the symptoms who recommended that she use Breztri for now.  She has been doing the [place] since February and she did note improvement in her shortness of breath symptoms that she has continued to take it.  Review of systems: Review of Systems  Constitutional: Negative.   HENT: Negative.    Eyes: Negative.   Respiratory: Negative.    Cardiovascular: Negative.   Gastrointestinal: Negative.   Musculoskeletal: Negative.   Skin:  Positive for rash.  Allergic/Immunologic: Negative.   Neurological: Negative.      All other systems negative unless noted above in HPI  Past medical/social/surgical/family history have been reviewed and are unchanged unless specifically indicated below.  No changes  Medication List: Current Outpatient Medications  Medication Sig Dispense Refill   Budeson-Glycopyrrol-Formoterol  (BREZTRI AEROSPHERE) 160-9-4.8 MCG/ACT AERO Inhale 2 puffs into the lungs in the morning and at bedtime. 10.7 g 5   EPINEPHrine (EPIPEN 2-PAK) 0.3 mg/0.3 mL IJ SOAJ injection Inject 0.3 mg into the muscle as needed for anaphylaxis. 2 each 1   fexofenadine (ALLEGRA) 180 MG tablet Take 1 tablet (180 mg total) by mouth daily. (Patient taking differently: Take 180 mg by mouth as needed for allergies.) 30 tablet 5   furosemide (LASIX) 20 MG tablet TAKE 1 TABLET(20 MG) BY MOUTH EVERY OTHER DAY 30 tablet 3   latanoprost (XALATAN) 0.005 % ophthalmic solution Place 1 drop into both eyes at bedtime.     metoprolol succinate (TOPROL-XL) 25 MG 24 hr tablet TAKE 1 TABLET(25 MG) BY MOUTH DAILY 90 tablet 1   Propylene Glycol (SYSTANE BALANCE OP) Apply 1 drop to eye daily.     sacubitril-valsartan (ENTRESTO) 97-103 MG Take 1 tablet by mouth daily.      spironolactone (ALDACTONE) 25 MG tablet TAKE 1 TABLET(25 MG) BY MOUTH EVERY MORNING 90 tablet 0   XARELTO 20 MG TABS tablet Take 20 mg by mouth daily.     Current Facility-Administered Medications  Medication Dose Route Frequency Provider Last Rate Last Admin   omalizumab Geoffry Paradise) injection 300 mg  300 mg Subcutaneous Q28 days Marcelyn Bruins, MD   300 mg at 09/15/22 1423     Known medication allergies: Allergies  Allergen Reactions   Methocarbamol     Confusion     Physical examination:  Blood pressure 124/60, pulse 67, temperature 98.3 F (36.8 C), temperature source Temporal, resp. rate 20, SpO2 97 %.  General: Alert, interactive, in no acute distress. HEENT: PERRLA, TMs pearly gray, turbinates non-edematous without discharge, post-pharynx non erythematous. Neck: Supple without lymphadenopathy. Lungs: Clear to auscultation without wheezing, rhonchi or rales. {no increased work of breathing. CV: Normal S1, S2 without murmurs. Abdomen: Nondistended, nontender. Skin: Warm and dry, without lesions or rashes. Extremities:  No clubbing,  cyanosis or edema. Neuro:   Grossly intact.  Diagnositics/Labs: None today  Assessment and plan: Chronic urticaria and angioedema  - was under control until April 2024   - continue following medications:   Allegra 180mg  1 tab daily WITH Pepcid 20mg  1 tab daily   If daily dosing is not effective in controlling hives then increase both medications to twice a day dosing  - continue Xolair injections every 4 weeks  - continue to have access to your Epipen while on Xolair injections  Allergic rhinitis - Allegra as above  Shortness of breath -Continue Breztri as directed by Dr. Craige Cotta -Continue pulmonary follow-up when needed  Follow-up 12 months or sooner if needed  I appreciate the opportunity to take part in [facility] care. Please do not hesitate to contact me with questions.  Sincerely,   Margo Aye, MD Allergy/Immunology [facility]

## 2022-09-15 NOTE — Patient Instructions (Addendum)
Hives and swelling  - was under control until April 2024   - continue following medications:   Allegra 180mg  1 tab daily WITH Pepcid 20mg  1 tab daily   If daily dosing is not effective in controlling hives then increase both medications to twice a day dosing  - continue Xolair injections every 4 weeks  - continue to have access to your Epipen while on Xolair injections  Allergic rhinitis - Allegra as above  Shortness of breath -Continue Breztri as directed by Dr. Craige Cotta -Continue pulmonary follow-up when needed  Follow-up 12 months or sooner if needed

## 2022-09-20 DIAGNOSIS — S12100D Unspecified displaced fracture of second cervical vertebra, subsequent encounter for fracture with routine healing: Secondary | ICD-10-CM | POA: Diagnosis not present

## 2022-09-20 DIAGNOSIS — M7551 Bursitis of right shoulder: Secondary | ICD-10-CM | POA: Diagnosis not present

## 2022-09-20 DIAGNOSIS — Z6841 Body Mass Index (BMI) 40.0 and over, adult: Secondary | ICD-10-CM | POA: Diagnosis not present

## 2022-09-21 ENCOUNTER — Ambulatory Visit: Payer: Medicare Other

## 2022-09-21 DIAGNOSIS — R252 Cramp and spasm: Secondary | ICD-10-CM

## 2022-09-21 DIAGNOSIS — R293 Abnormal posture: Secondary | ICD-10-CM

## 2022-09-21 DIAGNOSIS — M6281 Muscle weakness (generalized): Secondary | ICD-10-CM | POA: Diagnosis not present

## 2022-09-21 DIAGNOSIS — M25611 Stiffness of right shoulder, not elsewhere classified: Secondary | ICD-10-CM | POA: Diagnosis not present

## 2022-09-21 DIAGNOSIS — M25511 Pain in right shoulder: Secondary | ICD-10-CM | POA: Diagnosis not present
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
## 2022-09-22 DIAGNOSIS — H401131 Primary open-angle glaucoma, bilateral, mild stage: Secondary | ICD-10-CM | POA: Diagnosis not present

## 2022-09-22 DIAGNOSIS — H04123 Dry eye syndrome of bilateral lacrimal glands: Secondary | ICD-10-CM | POA: Diagnosis not present

## 2022-09-22 DIAGNOSIS — H5203 Hypermetropia, bilateral: Secondary | ICD-10-CM | POA: Diagnosis not present

## 2022-09-22 DIAGNOSIS — Z961 Presence of intraocular lens: Secondary | ICD-10-CM | POA: Diagnosis not present

## 2022-09-22 DIAGNOSIS — H43813 Vitreous degeneration, bilateral: Secondary | ICD-10-CM | POA: Diagnosis not present

## 2022-09-22 DIAGNOSIS — H524 Presbyopia: Secondary | ICD-10-CM | POA: Diagnosis not present

## 2022-09-26 ENCOUNTER — Ambulatory Visit: Payer: Medicare Other

## 2022-09-26 DIAGNOSIS — R252 Cramp and spasm: Secondary | ICD-10-CM

## 2022-09-26 DIAGNOSIS — M6281 Muscle weakness (generalized): Secondary | ICD-10-CM

## 2022-09-26 DIAGNOSIS — M25511 Pain in right shoulder: Secondary | ICD-10-CM

## 2022-09-26 DIAGNOSIS — M25611 Stiffness of right shoulder, not elsewhere classified: Secondary | ICD-10-CM | POA: Diagnosis not present

## 2022-09-26 DIAGNOSIS — R293 Abnormal posture: Secondary | ICD-10-CM | POA: Diagnosis not present
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
## 2022-09-28 ENCOUNTER — Encounter: Payer: Self-pay | Admitting: Physical Therapy

## 2022-09-28 ENCOUNTER — Ambulatory Visit: Payer: Medicare Other | Admitting: Physical Therapy

## 2022-09-28 DIAGNOSIS — M25511 Pain in right shoulder: Secondary | ICD-10-CM | POA: Diagnosis not present

## 2022-09-28 DIAGNOSIS — M25611 Stiffness of right shoulder, not elsewhere classified: Secondary | ICD-10-CM | POA: Diagnosis not present

## 2022-09-28 DIAGNOSIS — R293 Abnormal posture: Secondary | ICD-10-CM | POA: Diagnosis not present

## 2022-09-28 DIAGNOSIS — M6281 Muscle weakness (generalized): Secondary | ICD-10-CM | POA: Diagnosis not present

## 2022-09-28 DIAGNOSIS — R252 Cramp and spasm: Secondary | ICD-10-CM | POA: Diagnosis not present
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
## 2022-10-03 ENCOUNTER — Ambulatory Visit: Payer: Medicare Other

## 2022-10-03 DIAGNOSIS — M25511 Pain in right shoulder: Secondary | ICD-10-CM | POA: Diagnosis not present

## 2022-10-03 DIAGNOSIS — M6281 Muscle weakness (generalized): Secondary | ICD-10-CM | POA: Diagnosis not present

## 2022-10-03 DIAGNOSIS — R293 Abnormal posture: Secondary | ICD-10-CM | POA: Diagnosis not present

## 2022-10-03 DIAGNOSIS — M25611 Stiffness of right shoulder, not elsewhere classified: Secondary | ICD-10-CM

## 2022-10-03 DIAGNOSIS — R252 Cramp and spasm: Secondary | ICD-10-CM
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
## 2022-10-05 ENCOUNTER — Ambulatory Visit: Payer: Medicare Other | Admitting: Physical Therapy

## 2022-10-05 ENCOUNTER — Encounter: Payer: Self-pay | Admitting: Physical Therapy

## 2022-10-05 DIAGNOSIS — M25611 Stiffness of right shoulder, not elsewhere classified: Secondary | ICD-10-CM

## 2022-10-05 DIAGNOSIS — R293 Abnormal posture: Secondary | ICD-10-CM | POA: Diagnosis not present

## 2022-10-05 DIAGNOSIS — M25511 Pain in right shoulder: Secondary | ICD-10-CM | POA: Diagnosis not present

## 2022-10-05 DIAGNOSIS — M6281 Muscle weakness (generalized): Secondary | ICD-10-CM | POA: Diagnosis not present

## 2022-10-05 DIAGNOSIS — R252 Cramp and spasm: Secondary | ICD-10-CM | POA: Diagnosis not present
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
## 2022-10-06 DIAGNOSIS — Z0000 Encounter for general adult medical examination without abnormal findings: Secondary | ICD-10-CM | POA: Diagnosis not present

## 2022-10-06 DIAGNOSIS — L89142 Pressure ulcer of left lower back, stage 2: Secondary | ICD-10-CM | POA: Diagnosis not present

## 2022-10-10 ENCOUNTER — Encounter: Payer: Self-pay | Admitting: Cardiology

## 2022-10-10 ENCOUNTER — Ambulatory Visit: Payer: Medicare Other | Admitting: Cardiology

## 2022-10-10 ENCOUNTER — Ambulatory Visit: Payer: Medicare Other | Attending: Physician Assistant

## 2022-10-10 VITALS — BP 121/71 | HR 64 | Ht 63.0 in | Wt 251.0 lb

## 2022-10-10 DIAGNOSIS — Z13220 Encounter for screening for lipoid disorders: Secondary | ICD-10-CM | POA: Diagnosis not present

## 2022-10-10 DIAGNOSIS — M25511 Pain in right shoulder: Secondary | ICD-10-CM | POA: Diagnosis not present

## 2022-10-10 DIAGNOSIS — R293 Abnormal posture: Secondary | ICD-10-CM | POA: Insufficient documentation

## 2022-10-10 DIAGNOSIS — E66813 Obesity, class 3: Secondary | ICD-10-CM

## 2022-10-10 DIAGNOSIS — I5032 Chronic diastolic (congestive) heart failure: Secondary | ICD-10-CM

## 2022-10-10 DIAGNOSIS — M25611 Stiffness of right shoulder, not elsewhere classified: Secondary | ICD-10-CM | POA: Insufficient documentation

## 2022-10-10 DIAGNOSIS — R252 Cramp and spasm: Secondary | ICD-10-CM | POA: Insufficient documentation

## 2022-10-10 DIAGNOSIS — Z86711 Personal history of pulmonary embolism: Secondary | ICD-10-CM | POA: Diagnosis not present

## 2022-10-10 DIAGNOSIS — I10 Essential (primary) hypertension: Secondary | ICD-10-CM

## 2022-10-10 DIAGNOSIS — Z6841 Body Mass Index (BMI) 40.0 and over, adult: Secondary | ICD-10-CM | POA: Diagnosis not present

## 2022-10-10 DIAGNOSIS — M6281 Muscle weakness (generalized): Secondary | ICD-10-CM | POA: Insufficient documentation

## 2022-10-10 MED ORDER — ENTRESTO 49-51 MG PO TABS: 1.0000 | ORAL_TABLET | Freq: Two times a day (BID) | ORAL | 0 refills | Status: DC

## 2022-10-10 NOTE — Progress Notes (Signed)
Kimberly Crosby Date of Birth: [birth year] MRN: [id number] Primary Care Provider:Bland, Adrian Saran, MD Former Cardiology Providers: Dr. Yates Decamp, Altamese McRoberts, APRN, FNP-C Pulmonologist: Dr. Craige Cotta Medical oncologist: Dr. Thornton Papas Primary Cardiologist: Tessa Lerner, DO, [facility] (established care 02/26/2020)  Date: 10/10/22 Last Office Visit: 04/10/2022  Chief Complaint  Patient presents with   Chronic heart failure with preserved ejection fraction (HFp   Follow-up    HPI  Kimberly Crosby is a 82 y.o.  female whose past medical history and cardiovascular risk factors include: Hx of Large right-sided pulmonary embolism extending from the right main pulmonary artery into lobar, segmental and subsegmental sized branches in the right lung (08/27/2020), aortic atherosclerosis, hypertension, obesity, chronic diastolic heart failure, Former smoker, advanced age, postmenopausal female.  Patient is being followed by the practice given her HFpEF and benign essential hypertension.  At last office visit no changes were warranted as she she was overall stable on current medical therapy.  She was advised to establish care with pulmonary medicine to evaluate the duration of anticoagulation.  She saw Dr. Craige Cotta shortly thereafter and was referred to hematology for thrombophilic workup. She saw Dr. Thornton Papas who felt that indefinite anticoagulation may be beneficial since it is likely unprovoked and given the  large size and underlying obesity.  She presents today for follow-up.  She denies anginal chest pain or heart failure symptoms.  Anticoagulation currently managed by PCP.  No recent labs available for review.   FUNCTIONAL STATUS: She walks 2 days a week each time walking about 2.5 mile.    ALLERGIES: Allergies  Allergen Reactions   Methocarbamol     Confusion   MEDICATION LIST PRIOR TO VISIT: Current Outpatient Medications on File Prior to Visit  Medication Sig Dispense Refill    Budeson-Glycopyrrol-Formoterol (BREZTRI AEROSPHERE) 160-9-4.8 MCG/ACT AERO Inhale 2 puffs into the lungs in the morning and at bedtime. 10.7 g 5   EPINEPHrine (EPIPEN 2-PAK) 0.3 mg/0.3 mL IJ SOAJ injection Inject 0.3 mg into the muscle as needed for anaphylaxis. 2 each 1   fexofenadine (ALLEGRA) 180 MG tablet Take 1 tablet (180 mg total) by mouth daily. (Patient taking differently: Take 180 mg by mouth as needed for allergies.) 30 tablet 5   furosemide (LASIX) 20 MG tablet TAKE 1 TABLET(20 MG) BY MOUTH EVERY OTHER DAY 30 tablet 3   latanoprost (XALATAN) 0.005 % ophthalmic solution Place 1 drop into both eyes at bedtime.     metoprolol succinate (TOPROL-XL) 25 MG 24 hr tablet TAKE 1 TABLET(25 MG) BY MOUTH DAILY 90 tablet 1   Propylene Glycol (SYSTANE BALANCE OP) Apply 1 drop to eye daily.     spironolactone (ALDACTONE) 25 MG tablet TAKE 1 TABLET(25 MG) BY MOUTH EVERY MORNING 90 tablet 0   XARELTO 20 MG TABS tablet Take 20 mg by mouth daily.     Current Facility-Administered Medications on File Prior to Visit  Medication Dose Route Frequency Provider Last Rate Last Admin   omalizumab Geoffry Paradise) injection 300 mg  300 mg Subcutaneous Q28 days Marcelyn Bruins, MD   300 mg at 09/15/22 1423    PAST MEDICAL HISTORY: Past Medical History:  Diagnosis Date   Angioedema    Asthma    Breast cyst    CHF (congestive heart failure) (HCC)    Chronic idiopathic urticaria    Edema    Hypertension    OSA (obstructive sleep apnea)    Osteoporosis 11/2017   T score -3.4 distal third radius  Pulmonary embolism (HCC)     PAST SURGICAL HISTORY: Past Surgical History:  Procedure Laterality Date   BREAST EXCISIONAL BIOPSY Left    BREAST SURGERY     biopsy-cyst    FAMILY HISTORY: The patient's family history includes Breast cancer (age of onset: 33) in her mother; Diabetes in her brother; Heart disease (age of onset: 71) in her father.   SOCIAL HISTORY:  The patient  reports that she quit  smoking about 28 years ago. Her smoking use included cigarettes. She has a 0.75 pack-year smoking history. She has never used smokeless tobacco. She reports that she does not currently use alcohol. She reports that she does not use drugs.  Review of Systems  Cardiovascular:  Positive for dyspnea on exertion (Chronic and stable). Negative for chest pain, claudication, irregular heartbeat, leg swelling, near-syncope, orthopnea, palpitations, paroxysmal nocturnal dyspnea and syncope.  Respiratory:  Negative for shortness of breath.   Hematologic/Lymphatic: Negative for bleeding problem.  Musculoskeletal:  Negative for muscle cramps and myalgias.  Neurological:  Negative for dizziness and light-headedness.    PHYSICAL EXAM:    10/10/2022   11:36 AM 09/15/2022    3:05 PM 06/12/2022    2:46 PM  Vitals with BMI  Height 5\' 3"   5\' 2"   Weight 251 lbs  249 lbs  BMI 44.47  45.53  Systolic 121 124 098  Diastolic 71 60 60  Pulse 64 67 85   Physical Exam  Constitutional: No distress.  Age appropriate, hemodynamically stable.   Neck: No JVD present.  Cardiovascular: Normal rate, regular rhythm, S1 normal, S2 normal, intact distal pulses and normal pulses. Exam reveals no gallop, no S3 and no S4.  No murmur heard. Pulmonary/Chest: Effort normal and breath sounds normal. No stridor. She has no wheezes. She has no rales.  Abdominal: Soft. Bowel sounds are normal. She exhibits no distension. There is no abdominal tenderness.  Abdominal obesity  Musculoskeletal:        General: No edema.     Cervical back: Neck supple.  Neurological: She is alert and oriented to person, place, and time. She has intact cranial nerves (2-12).  Skin: Skin is warm and moist.   CTA PE Protocol  08/27/2020: 1. Large right-sided pulmonary embolism extending from the right main pulmonary artery into lobar, segmental and subsegmental sized branches in the right lung. 2. Mild cardiomegaly. 3. Aortic  atherosclerosis.  CARDIAC DATABASE: EKG: October 10, 2022: Sinus bradycardia, 54 bpm, low voltage in the precordial leads, without underlying injury pattern  Echocardiogram: 08/28/2020:  1. Left ventricular ejection fraction, by estimation, is 65 to 70%. The left ventricle has normal function. The left ventricle has no regional wall motion abnormalities. There is mild left ventricular hypertrophy. Left ventricular diastolic parameters  are consistent with Grade I diastolic dysfunction (impaired relaxation).   2. Right ventricular systolic function is normal. The right ventricular  size is mildly enlarged. There is mildly elevated pulmonary artery systolic pressure. The estimated right ventricular systolic pressure is  34.6 mmHg.   3. The mitral valve is normal in structure. No evidence of mitral valve regurgitation.   4. The aortic valve is tricuspid. Aortic valve regurgitation is not visualized. No aortic stenosis is present.   5. The inferior vena cava is normal in size with greater than 50% respiratory variability, suggesting right atrial pressure of 3 mmHg.  Stress Testing:  Lexiscan Sestamibi stress test 09/08/2020: 1 Day Rest/Stress Protocol. Stress EKG is non-diagnostic, as this is pharmacological stress test using  Lexiscan. Normal myocardial perfusion without evidence of reversible ischemia or prior infarct. Left ventricular size normal. Left ventricular wall thickness is preserved without regional wall motion abnormalities, calculated EF 55%. Compared to prior study 07/27/2017: No significant change.  Low risk study.   Heart Catheterization: None  LABORATORY DATA:    Latest Ref Rng & Units 05/03/2022    8:04 PM 05/03/2022    6:15 PM 08/28/2020   11:49 PM  CBC  WBC 4.0 - 10.5 K/uL  9.0  9.2   Hemoglobin 12.0 - 15.0 g/dL 16.1  09.6    04.5  40.9   Hematocrit 36.0 - 46.0 % 39.0  38.0    40.5  35.7   Platelets 150 - 400 K/uL  255  236        Latest Ref Rng & Units  05/03/2022    8:04 PM 05/03/2022    6:15 PM 11/22/2021   12:00 PM  CMP  Glucose 70 - 99 mg/dL 811  914    782  92   BUN 8 - 23 mg/dL 15  13    14  15    Creatinine 0.44 - 1.00 mg/dL 9.56  2.13    0.86  5.78   Sodium 135 - 145 mmol/L 143  139    141  140   Potassium 3.5 - 5.1 mmol/L 4.0  4.2    3.9  4.5   Chloride 98 - 111 mmol/L 108  113    109  105   CO2 22 - 32 mmol/L  19  21   Calcium 8.9 - 10.3 mg/dL  9.3  9.7   Total Protein 6.5 - 8.1 g/dL  7.1    Total Bilirubin 0.3 - 1.2 mg/dL  0.7    Alkaline Phos 38 - 126 U/L  78    AST 15 - 41 U/L  17    ALT 0 - 44 U/L  11      Lipid Panel  No results found for: "CHOL", "TRIG", "HDL", "CHOLHDL", "VLDL", "LDLCALC", "LDLDIRECT", "LABVLDL"  No results found for: "HGBA1C" No components found for: "NTPROBNP" No results found for: "TSH"  Cardiac Panel (last 3 results) No results for input(s): "CKTOTAL", "CKMB", "TROPONINIHS", "RELINDX" in the last 72 hours.  IMPRESSION:    ICD-10-CM   1. Chronic heart failure with preserved ejection fraction (HFpEF) (HCC)  I50.32 EKG 12-Lead    Lipid Panel With LDL/HDL Ratio    LDL cholesterol, direct    CMP14+EGFR    Pro b natriuretic peptide (BNP)    sacubitril-valsartan (ENTRESTO) 49-51 MG    2. Essential hypertension  I10     3. Screening for lipid disorders  Z13.220 Lipid Panel With LDL/HDL Ratio    LDL cholesterol, direct    4. Hx of pulmonary embolus  Z86.711     5. Class 3 severe obesity due to excess calories with serious comorbidity and body mass index (BMI) of 40.0 to 44.9 in adult [facility])  E66.01    Z68.41        RECOMMENDATIONS: ZELINA PAOLETTI is a 82 y.o. female whose past medical history and cardiovascular risk factors include: Hx of Large right-sided pulmonary embolism extending from the right main pulmonary artery into lobar, segmental and subsegmental sized branches in the right lung (08/27/2020), aortic atherosclerosis, hypertension, obesity, chronic diastolic heart  failure, Former smoker, advanced age, postmenopausal female.  Chronic heart failure with preserved ejection fraction (HFpEF) (HCC) Chronic and stable. Euvolemic. Stage B, NYHA class II. Medications  reconciled. Blood pressures are well-controlled Last echo and stress test results reviewed. Currently takes Entresto 97/103 mg p.o. daily. Will change Entresto to 49/51 mg p.o. twice daily. Will check labs to evaluate renal function and BNP. No prior lipids available for review.  Will check fasting lipid profile. We discussed the role of Ozempic/Wegovy for underlying obesity patient would like to discuss this further with PCP.  Will defer initiation w/ PCP.  Essential hypertension Office blood pressures are well-controlled. Medications reconciled and changes noted above  Hx of Large right-sided pulmonary embolism extending from the right main pulmonary artery into lobar, segmental and subsegmental sized branches in the right lung (08/27/2020) Index event April 2022. Remains on Xarelto -managed by PCP.  Has been evaluated pulmonary medicine and hematology-their consultation notes reviewed.  FINAL MEDICATION LIST END OF ENCOUNTER: Meds ordered this encounter  Medications   sacubitril-valsartan (ENTRESTO) 49-51 MG    Sig: Take 1 tablet by mouth 2 (two) times daily.    Dispense:  180 tablet    Refill:  0     Current Outpatient Medications:    Budeson-Glycopyrrol-Formoterol (BREZTRI AEROSPHERE) 160-9-4.8 MCG/ACT AERO, Inhale 2 puffs into the lungs in the morning and at bedtime., Disp: 10.7 g, Rfl: 5   EPINEPHrine (EPIPEN 2-PAK) 0.3 mg/0.3 mL IJ SOAJ injection, Inject 0.3 mg into the muscle as needed for anaphylaxis., Disp: 2 each, Rfl: 1   fexofenadine (ALLEGRA) 180 MG tablet, Take 1 tablet (180 mg total) by mouth daily. (Patient taking differently: Take 180 mg by mouth as needed for allergies.), Disp: 30 tablet, Rfl: 5   furosemide (LASIX) 20 MG tablet, TAKE 1 TABLET(20 MG) BY MOUTH EVERY  OTHER DAY, Disp: 30 tablet, Rfl: 3   latanoprost (XALATAN) 0.005 % ophthalmic solution, Place 1 drop into both eyes at bedtime., Disp: , Rfl:    metoprolol succinate (TOPROL-XL) 25 MG 24 hr tablet, TAKE 1 TABLET(25 MG) BY MOUTH DAILY, Disp: 90 tablet, Rfl: 1   Propylene Glycol (SYSTANE BALANCE OP), Apply 1 drop to eye daily., Disp: , Rfl:    sacubitril-valsartan (ENTRESTO) 49-51 MG, Take 1 tablet by mouth 2 (two) times daily., Disp: 180 tablet, Rfl: 0   spironolactone (ALDACTONE) 25 MG tablet, TAKE 1 TABLET(25 MG) BY MOUTH EVERY MORNING, Disp: 90 tablet, Rfl: 0   XARELTO 20 MG TABS tablet, Take 20 mg by mouth daily., Disp: , Rfl:   Current Facility-Administered Medications:    omalizumab Geoffry Paradise) injection 300 mg, 300 mg, Subcutaneous, Q28 days, Marcelyn Bruins, MD, 300 mg at 09/15/22 1423  Orders Placed This Encounter  Procedures   Lipid Panel With LDL/HDL Ratio   LDL cholesterol, direct   [id number]   Pro b natriuretic peptide (BNP)   EKG 12-Lead   --Continue cardiac medications as reconciled in final medication list. --Return in about 6 months (around 04/11/2023) for Follow up, heart failure management.. Or sooner if needed. --Continue follow-up with your primary care physician regarding the management of your other chronic comorbid conditions.  Patient's questions and concerns were addressed to her satisfaction. She voices understanding of the instructions provided during this encounter.   This note was created using a voice recognition software as a result there may be grammatical errors inadvertently enclosed that do not reflect the nature of this encounter. Every attempt is made to correct such errors.  Tessa Lerner, [place], [facility]  Pager:  [phone] Office: [phone]
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
## 2022-10-11 ENCOUNTER — Other Ambulatory Visit: Payer: Self-pay

## 2022-10-11 ENCOUNTER — Telehealth: Payer: Self-pay

## 2022-10-11 DIAGNOSIS — I5032 Chronic diastolic (congestive) heart failure: Secondary | ICD-10-CM

## 2022-10-11 MED ORDER — ENTRESTO 49-51 MG PO TABS
1.0000 | ORAL_TABLET | Freq: Two times a day (BID) | ORAL | 0 refills | Status: DC
Start: 2022-10-11 — End: 2022-10-26

## 2022-10-11 NOTE — Telephone Encounter (Signed)
LMOVM to inform patient that Dr. Odis Hollingshead had adjusted her Sherryll Burger rx it is now 49-51mg  bid.

## 2022-10-12 ENCOUNTER — Ambulatory Visit: Payer: Medicare Other

## 2022-10-12 DIAGNOSIS — M6281 Muscle weakness (generalized): Secondary | ICD-10-CM | POA: Diagnosis not present

## 2022-10-12 DIAGNOSIS — M25611 Stiffness of right shoulder, not elsewhere classified: Secondary | ICD-10-CM | POA: Diagnosis not present

## 2022-10-12 DIAGNOSIS — M25511 Pain in right shoulder: Secondary | ICD-10-CM | POA: Diagnosis not present

## 2022-10-12 DIAGNOSIS — R252 Cramp and spasm: Secondary | ICD-10-CM

## 2022-10-12 DIAGNOSIS — R293 Abnormal posture: Secondary | ICD-10-CM
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
## 2022-10-13 ENCOUNTER — Ambulatory Visit (INDEPENDENT_AMBULATORY_CARE_PROVIDER_SITE_OTHER): Payer: Medicare Other | Admitting: *Deleted

## 2022-10-13 DIAGNOSIS — L501 Idiopathic urticaria: Secondary | ICD-10-CM | POA: Diagnosis not present

## 2022-10-17 ENCOUNTER — Ambulatory Visit: Payer: Medicare Other

## 2022-10-17 DIAGNOSIS — R293 Abnormal posture: Secondary | ICD-10-CM

## 2022-10-17 DIAGNOSIS — M6281 Muscle weakness (generalized): Secondary | ICD-10-CM | POA: Diagnosis not present

## 2022-10-17 DIAGNOSIS — R252 Cramp and spasm: Secondary | ICD-10-CM

## 2022-10-17 DIAGNOSIS — M25611 Stiffness of right shoulder, not elsewhere classified: Secondary | ICD-10-CM | POA: Diagnosis not present

## 2022-10-17 DIAGNOSIS — M25511 Pain in right shoulder: Secondary | ICD-10-CM | POA: Diagnosis not present
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
## 2022-10-26 ENCOUNTER — Other Ambulatory Visit: Payer: Self-pay

## 2022-10-26 ENCOUNTER — Telehealth: Payer: Self-pay

## 2022-10-26 MED ORDER — SACUBITRIL-VALSARTAN 97-103 MG PO TABS: 1.0000 | ORAL_TABLET | Freq: Two times a day (BID) | ORAL | 12 refills | Status: DC

## 2022-10-26 NOTE — Telephone Encounter (Signed)
Patient is currently taking Entresto 97/103 mg p.o. twice daily.  Blood pressures are between 120-130 mmHg. Patient prefers to be on Entresto 97/103 mg p.o. twice daily-formulation.  That's fine - update her medication list.   Tessa Lerner, DO, [facility]

## 2022-10-26 NOTE — Telephone Encounter (Signed)
Can patient stay on the Entresto 97-103 due to her insurance had already approved that dose and she doesn't want to have to get another pre authorization.

## 2022-11-06 ENCOUNTER — Ambulatory Visit (INDEPENDENT_AMBULATORY_CARE_PROVIDER_SITE_OTHER): Payer: Medicare Other

## 2022-11-06 ENCOUNTER — Other Ambulatory Visit: Payer: Self-pay | Admitting: Cardiology

## 2022-11-06 DIAGNOSIS — L501 Idiopathic urticaria: Secondary | ICD-10-CM | POA: Diagnosis not present

## 2022-11-06 DIAGNOSIS — I5032 Chronic diastolic (congestive) heart failure: Secondary | ICD-10-CM

## 2022-12-01 ENCOUNTER — Other Ambulatory Visit: Payer: Self-pay | Admitting: Cardiology

## 2022-12-01 DIAGNOSIS — I5032 Chronic diastolic (congestive) heart failure: Secondary | ICD-10-CM

## 2022-12-05 ENCOUNTER — Ambulatory Visit: Payer: Medicare Other

## 2022-12-05 DIAGNOSIS — L501 Idiopathic urticaria: Secondary | ICD-10-CM | POA: Diagnosis not present

## 2022-12-06 ENCOUNTER — Ambulatory Visit: Payer: Medicare Other

## 2022-12-14 DIAGNOSIS — I2699 Other pulmonary embolism without acute cor pulmonale: Secondary | ICD-10-CM | POA: Diagnosis not present

## 2022-12-14 DIAGNOSIS — R0602 Shortness of breath: Secondary | ICD-10-CM | POA: Diagnosis not present

## 2022-12-14 DIAGNOSIS — E7800 Pure hypercholesterolemia, unspecified: Secondary | ICD-10-CM | POA: Diagnosis not present

## 2022-12-14 DIAGNOSIS — I110 Hypertensive heart disease with heart failure: Secondary | ICD-10-CM | POA: Diagnosis not present

## 2022-12-14 DIAGNOSIS — E6609 Other obesity due to excess calories: Secondary | ICD-10-CM | POA: Diagnosis not present

## 2022-12-27 ENCOUNTER — Other Ambulatory Visit: Payer: Self-pay | Admitting: Obstetrics and Gynecology

## 2022-12-27 DIAGNOSIS — Z1231 Encounter for screening mammogram for malignant neoplasm of breast: Secondary | ICD-10-CM

## 2023-01-02 ENCOUNTER — Ambulatory Visit (INDEPENDENT_AMBULATORY_CARE_PROVIDER_SITE_OTHER): Payer: Medicare Other | Admitting: *Deleted

## 2023-01-02 DIAGNOSIS — L501 Idiopathic urticaria: Secondary | ICD-10-CM

## 2023-01-24 ENCOUNTER — Ambulatory Visit
Admission: RE | Admit: 2023-01-24 | Discharge: 2023-01-24 | Disposition: A | Discharge status: Home / Self Care | Payer: Medicare Other | Source: Ambulatory Visit | Attending: Obstetrics and Gynecology

## 2023-01-24 DIAGNOSIS — Z1231 Encounter for screening mammogram for malignant neoplasm of breast: Secondary | ICD-10-CM

## 2023-01-25 DIAGNOSIS — R0602 Shortness of breath: Secondary | ICD-10-CM | POA: Diagnosis not present

## 2023-01-25 DIAGNOSIS — E7800 Pure hypercholesterolemia, unspecified: Secondary | ICD-10-CM | POA: Diagnosis not present

## 2023-01-25 DIAGNOSIS — I2699 Other pulmonary embolism without acute cor pulmonale: Secondary | ICD-10-CM | POA: Diagnosis not present

## 2023-01-25 DIAGNOSIS — I110 Hypertensive heart disease with heart failure: Secondary | ICD-10-CM | POA: Diagnosis not present

## 2023-01-25 DIAGNOSIS — M25562 Pain in left knee: Secondary | ICD-10-CM | POA: Diagnosis not present

## 2023-01-30 ENCOUNTER — Ambulatory Visit (INDEPENDENT_AMBULATORY_CARE_PROVIDER_SITE_OTHER): Payer: Medicare Other

## 2023-01-30 DIAGNOSIS — L501 Idiopathic urticaria: Secondary | ICD-10-CM | POA: Diagnosis not present

## 2023-02-07 DIAGNOSIS — H401131 Primary open-angle glaucoma, bilateral, mild stage: Secondary | ICD-10-CM | POA: Diagnosis not present

## 2023-02-10 ENCOUNTER — Other Ambulatory Visit: Payer: Self-pay | Admitting: Cardiology

## 2023-02-10 DIAGNOSIS — I5032 Chronic diastolic (congestive) heart failure: Secondary | ICD-10-CM

## 2023-02-22 ENCOUNTER — Other Ambulatory Visit (HOSPITAL_COMMUNITY): Payer: Self-pay

## 2023-02-22 MED ORDER — INFLUENZA VAC A&B SURF ANT ADJ 0.5 ML IM SUSY
0.5000 mL | PREFILLED_SYRINGE | Freq: Once | INTRAMUSCULAR | 0 refills | Status: AC
  Filled 2023-02-22: qty 0.5, 1d supply, fill #0

## 2023-02-27 ENCOUNTER — Ambulatory Visit: Payer: Medicare Other | Admitting: *Deleted

## 2023-02-27 DIAGNOSIS — L501 Idiopathic urticaria: Secondary | ICD-10-CM

## 2023-03-15 DIAGNOSIS — R52 Pain, unspecified: Secondary | ICD-10-CM | POA: Diagnosis not present

## 2023-03-15 DIAGNOSIS — I110 Hypertensive heart disease with heart failure: Secondary | ICD-10-CM | POA: Diagnosis not present

## 2023-03-15 DIAGNOSIS — E785 Hyperlipidemia, unspecified: Secondary | ICD-10-CM | POA: Diagnosis not present

## 2023-03-15 DIAGNOSIS — I5032 Chronic diastolic (congestive) heart failure: Secondary | ICD-10-CM | POA: Diagnosis not present

## 2023-03-15 DIAGNOSIS — M130 Polyarthritis, unspecified: Secondary | ICD-10-CM | POA: Diagnosis not present

## 2023-03-15 DIAGNOSIS — R0602 Shortness of breath: Secondary | ICD-10-CM | POA: Diagnosis not present

## 2023-03-20 ENCOUNTER — Encounter: Payer: Self-pay | Admitting: Obstetrics and Gynecology

## 2023-03-20 ENCOUNTER — Ambulatory Visit (INDEPENDENT_AMBULATORY_CARE_PROVIDER_SITE_OTHER): Payer: Medicare Other | Admitting: Obstetrics and Gynecology

## 2023-03-20 VITALS — BP 110/68 | HR 64 | Ht 61.75 in | Wt 245.0 lb

## 2023-03-20 DIAGNOSIS — Z01419 Encounter for gynecological examination (general) (routine) without abnormal findings: Secondary | ICD-10-CM

## 2023-03-20 DIAGNOSIS — D219 Benign neoplasm of connective and other soft tissue, unspecified: Secondary | ICD-10-CM

## 2023-03-20 DIAGNOSIS — N3281 Overactive bladder: Secondary | ICD-10-CM | POA: Diagnosis not present

## 2023-03-20 DIAGNOSIS — M810 Age-related osteoporosis without current pathological fracture: Secondary | ICD-10-CM

## 2023-03-20 MED ORDER — DENOSUMAB 60 MG/ML ~~LOC~~ SOSY: 60.0000 mg | PREFILLED_SYRINGE | Freq: Once | SUBCUTANEOUS | Status: DC

## 2023-03-20 NOTE — Progress Notes (Signed)
82 y.o. y.o. female here for annual exam. She denies any pm bleeding.   No LMP recorded. Patient is postmenopausal.     [id number]   RP:  Established patient presenting for annual gyn exam    HPI: Postmenopausal, well on no HRT.  No hot flashes or night sweats.  No PMB.  No pelvic pain.  Pap smear Neg in 2017.  No indication to repeat a Pap test at this time.  Breasts normal. Screening mammo neg 9/24.  Colono 2021.  BD 11/202 Osteoporosis only at the Lt Forearm-3.1.  2023 -3.0.  BMI 47.57.  Health labs with Fam MD. Reports reflux and cannot take fosamax.  No current treatment for bones. PA for evenity and Prolia placed There were no vitals taken for this visit.  No results found for: "DIAGPAP", "HPVHIGH", "ADEQPAP"  GYN HISTORY: No results found for: "DIAGPAP", "HPVHIGH", "ADEQPAP"  OB History  Gravida Para Term Preterm AB Living  4 3 3   1 3   SAB IAB Ectopic Multiple Live Births  1            # Outcome Date GA Lbr Len/2nd Weight Sex Type Anes PTL Lv  4 SAB           3 Term           2 Term           1 Term             Past Medical History:  Diagnosis Date   Angioedema    Asthma    Breast cyst    CHF (congestive heart failure) (HCC)    Chronic idiopathic urticaria    Edema    Hypertension    OSA (obstructive sleep apnea)    Osteoporosis 11/2017   T score -3.4 distal third radius   Pulmonary embolism (HCC)     Past Surgical History:  Procedure Laterality Date   BREAST EXCISIONAL BIOPSY Left    BREAST SURGERY     biopsy-cyst    Current Outpatient Medications on File Prior to Visit  Medication Sig Dispense Refill   Budeson-Glycopyrrol-Formoterol (BREZTRI AEROSPHERE) 160-9-4.8 MCG/ACT AERO Inhale 2 puffs into the lungs in the morning and at bedtime. 10.7 g 5   EPINEPHrine (EPIPEN 2-PAK) 0.3 mg/0.3 mL IJ SOAJ injection Inject 0.3 mg into the muscle as needed for anaphylaxis. 2 each 1   fexofenadine (ALLEGRA) 180 MG tablet Take 1 tablet (180 mg total) by mouth  daily. (Patient taking differently: Take 180 mg by mouth as needed for allergies.) 30 tablet 5   furosemide (LASIX) 20 MG tablet TAKE 1 TABLET(20 MG) BY MOUTH EVERY OTHER DAY 30 tablet 3   latanoprost (XALATAN) 0.005 % ophthalmic solution Place 1 drop into both eyes at bedtime.     metoprolol succinate (TOPROL-XL) 25 MG 24 hr tablet TAKE 1 TABLET(25 MG) BY MOUTH DAILY 90 tablet 1   Propylene Glycol (SYSTANE BALANCE OP) Apply 1 drop to eye daily.     sacubitril-valsartan (ENTRESTO) 97-103 MG Take 1 tablet by mouth 2 (two) times daily. 60 tablet 12   spironolactone (ALDACTONE) 25 MG tablet TAKE 1 TABLET(25 MG) BY MOUTH EVERY MORNING 90 tablet 3   XARELTO 20 MG TABS tablet Take 20 mg by mouth daily.     Current Facility-Administered Medications on File Prior to Visit  Medication Dose Route Frequency Provider Last Rate Last Admin   omalizumab Geoffry Paradise) injection 300 mg  300 mg Subcutaneous Q28 days Margo Aye  Elease Hashimoto, MD   300 mg at 02/27/23 1146    Social History   Socioeconomic History   Marital status: Divorced    Spouse name: Not on file   Number of children: 3   Years of education: Not on file   Highest education level: Not on file  Occupational History   Not on file  Tobacco Use   Smoking status: Former    Current packs/day: 0.00    Average packs/day: 0.3 packs/day for 3.0 years (0.8 ttl pk-yrs)    Types: Cigarettes    Start date: 11/28/1990    Quit date: 11/27/1993    Years since quitting: 29.3   Smokeless tobacco: Never  Vaping Use   Vaping status: Never Used  Substance and Sexual Activity   Alcohol use: Not Currently    Comment: wine occassionally   Drug use: No   Sexual activity: Not Currently    Birth control/protection: Post-menopausal    Comment: 1st intercourse 82 yo-Fewer than 5 partners  Other Topics Concern   Not on file  Social History Narrative   Not on file   Social Determinants of Health   Financial Resource Strain: Not on file  Food Insecurity:  Not on file  Transportation Needs: Not on file  Physical Activity: Not on file  Stress: Not on file  Social Connections: Not on file  Intimate Partner Violence: Not on file    Family History  Problem Relation Age of Onset   Breast cancer Mother 76   Heart disease Father 66       MI   Diabetes Brother      Allergies  Allergen Reactions   Methocarbamol     Confusion      Patient's last menstrual period was No LMP recorded. Patient is postmenopausal..            Review of Systems Alls systems reviewed and are negative.  REPORTS bothersome OAB symptoms   Physical Exam Constitutional:      Appearance: Normal appearance.  Genitourinary:     Vulva and urethral meatus normal.     No lesions in the vagina.     Genitourinary Comments: Could not appreciate ovaries or uterus fully due to body habitus.  To get PUS     Right Labia: No rash, lesions or skin changes.    Left Labia: No lesions, skin changes or rash.    No vaginal discharge or tenderness.     No vaginal prolapse present.    No vaginal atrophy present.     Right Adnexa: not tender and not palpable.    Left Adnexa: not tender and not palpable.    No cervical motion tenderness or discharge.     Uterus is not enlarged, tender or irregular.     Uterus is anteverted.  Breasts:    Right: Normal.     Left: Normal.  HENT:     Head: Normocephalic.  Neck:     Thyroid: No thyroid mass, thyromegaly or thyroid tenderness.  Cardiovascular:     Rate and Rhythm: Normal rate and regular rhythm.     Heart sounds: Normal heart sounds, S1 normal and S2 normal.  Pulmonary:     Effort: Pulmonary effort is normal.     Breath sounds: Normal breath sounds and air entry.  Abdominal:     General: There is no distension.     Palpations: Abdomen is soft. There is no mass.     Tenderness: There is no abdominal tenderness. There  is no guarding or rebound.  Musculoskeletal:        General: Normal range of motion.     Cervical back:  Full passive range of motion without pain, normal range of motion and neck supple. No tenderness.     Right lower leg: No edema.     Left lower leg: No edema.  Neurological:     Mental Status: She is alert.  Skin:    General: Skin is warm.  Psychiatric:        Mood and Affect: Mood normal.        Behavior: Behavior normal.        Thought Content: Thought content normal.  Vitals and nursing note reviewed. Exam conducted with a chaperone present.    Body mass index is 45.17 kg/m.    A:         Well Woman GYN exam                             P:        Pap smear not indicated Encouraged annual mammogram screening Colon cancer screening aged out DXA up-to-date Labs and immunizations to do with PMD Discussed breast self exams Encouraged healthy lifestyle practices Encouraged Vit D and Calcium - PA for prolia and evenity for bone treatment placed.  Cannot take fosamax Referral to urogyn for OAB management Referral for TV US- could not fully appreciate on exam today due to body habitus No follow-ups on file.  Earley Favor

## 2023-03-22 ENCOUNTER — Telehealth: Payer: Self-pay | Admitting: *Deleted

## 2023-03-22 DIAGNOSIS — M810 Age-related osteoporosis without current pathological fracture: Secondary | ICD-10-CM

## 2023-03-22 NOTE — Telephone Encounter (Signed)
-----   Message from Earley Favor sent at 03/20/2023 12:02 PM EST ----- Can you run pa for prolia or evenity She has osteoporosis with no treatment  Needs calcium and Vit D tested as she is runs low.  She has reflux and cannot take fosamax Thank you Dr. Karma Greaser

## 2023-03-22 NOTE — Telephone Encounter (Signed)
 Insurance information submitted to [employer] portal. Will await summary of benefits for Evenity.

## 2023-03-28 ENCOUNTER — Ambulatory Visit: Payer: Self-pay | Admitting: *Deleted

## 2023-03-28 DIAGNOSIS — L501 Idiopathic urticaria: Secondary | ICD-10-CM

## 2023-04-10 DIAGNOSIS — Z13220 Encounter for screening for lipoid disorders: Secondary | ICD-10-CM | POA: Diagnosis not present

## 2023-04-10 DIAGNOSIS — I5032 Chronic diastolic (congestive) heart failure: Secondary | ICD-10-CM | POA: Diagnosis not present

## 2023-04-11 LAB — CMP14+EGFR
ALT: 14 [IU]/L (ref 0–32)
AST: 16 [IU]/L (ref 0–40)
Albumin: 3.8 g/dL (ref 3.7–4.7)
Alkaline Phosphatase: 97 [IU]/L (ref 44–121)
BUN/Creatinine Ratio: 17 (ref 12–28)
BUN: 17 mg/dL (ref 8–27)
Bilirubin Total: 0.7 mg/dL (ref 0.0–1.2)
CO2: 21 mmol/L (ref 20–29)
Calcium: 9.5 mg/dL (ref 8.7–10.3)
Chloride: 109 mmol/L — ABNORMAL HIGH (ref 96–106)
Creatinine, Ser: 0.99 mg/dL (ref 0.57–1.00)
Globulin, Total: 3.1 g/dL (ref 1.5–4.5)
Glucose: 93 mg/dL (ref 70–99)
Potassium: 3.9 mmol/L (ref 3.5–5.2)
Sodium: 145 mmol/L — ABNORMAL HIGH (ref 134–144)
Total Protein: 6.9 g/dL (ref 6.0–8.5)
eGFR: 57 mL/min/{1.73_m2} — ABNORMAL LOW (ref >59)

## 2023-04-11 LAB — LIPID PANEL WITH LDL/HDL RATIO
Cholesterol, Total: 165 mg/dL (ref 100–199)
HDL: 68 mg/dL (ref >39)
LDL Chol Calc (NIH): 86 mg/dL (ref 0–99)
LDL/HDL Ratio: 1.3 {ratio} (ref 0.0–3.2)
Triglycerides: 54 mg/dL (ref 0–149)
VLDL Cholesterol Cal: 11 mg/dL (ref 5–40)

## 2023-04-11 LAB — LDL CHOLESTEROL, DIRECT: LDL Direct: 98 mg/dL (ref 0–99)

## 2023-04-11 LAB — PRO B NATRIURETIC PEPTIDE: NT-Pro BNP: 106 pg/mL (ref 0–738)

## 2023-04-12 NOTE — Telephone Encounter (Signed)
Deductible:  $240 of $240 met   OOP MAX: none   Annual exam: 03-20-23 EB  Calcium:    9.5        Date: 04-10-23 GFR: 57  Upcoming dental procedures:   Hx of Kidney Disease:  Hx of heart attack or stroke:   Last Bone Density Scan: 03-21-22  Is Prior Authorization needed: no  Pt estimated Cost: $0    Coverage Details: Benefits subject to a $240 deductible and a 20% coinsurance for the administration and cost of Evenity. This is a [employer] researcher that covers the Medicare Part B coinsurance and deductible.

## 2023-04-12 NOTE — Telephone Encounter (Signed)
Message left to return call to [place] at [phone].   Dr. Karma Greaser- patient had CMP on 04-10-23 showing low eGFR at 57. Please advise on scheduling of Evenity.

## 2023-04-13 ENCOUNTER — Ambulatory Visit: Payer: Self-pay | Admitting: Cardiology

## 2023-04-16 ENCOUNTER — Encounter: Payer: Self-pay | Admitting: Obstetrics and Gynecology

## 2023-04-23 ENCOUNTER — Telehealth: Payer: Self-pay | Admitting: Obstetrics and Gynecology

## 2023-04-23 NOTE — Telephone Encounter (Signed)
Dr Karma Greaser placed order for ultrasound to be scheduled. Left message on November 18 & December 3 and mailed letter on December 9 for patient to call and schedule ultrasound; patient has not scheduled.

## 2023-04-25 ENCOUNTER — Ambulatory Visit (INDEPENDENT_AMBULATORY_CARE_PROVIDER_SITE_OTHER): Payer: Federal, State, Local not specified - PPO | Admitting: *Deleted

## 2023-04-25 DIAGNOSIS — L501 Idiopathic urticaria: Secondary | ICD-10-CM | POA: Diagnosis not present

## 2023-04-30 ENCOUNTER — Other Ambulatory Visit: Payer: Self-pay | Admitting: Cardiology

## 2023-05-10 ENCOUNTER — Encounter: Payer: Self-pay | Admitting: Cardiology

## 2023-05-10 ENCOUNTER — Ambulatory Visit: Payer: Medicare Other | Attending: Cardiology | Admitting: Cardiology

## 2023-05-10 VITALS — BP 138/86 | HR 81 | Resp 16 | Ht 61.0 in | Wt 250.0 lb

## 2023-05-10 DIAGNOSIS — I10 Essential (primary) hypertension: Secondary | ICD-10-CM | POA: Diagnosis not present

## 2023-05-10 DIAGNOSIS — Z6841 Body Mass Index (BMI) 40.0 and over, adult: Secondary | ICD-10-CM | POA: Diagnosis not present

## 2023-05-10 DIAGNOSIS — Z86711 Personal history of pulmonary embolism: Secondary | ICD-10-CM | POA: Insufficient documentation

## 2023-05-10 DIAGNOSIS — I5032 Chronic diastolic (congestive) heart failure: Secondary | ICD-10-CM | POA: Diagnosis not present

## 2023-05-10 DIAGNOSIS — E66813 Obesity, class 3: Secondary | ICD-10-CM | POA: Diagnosis not present

## 2023-05-10 NOTE — Telephone Encounter (Signed)
 Spoke with patient.   PUS and consult scheduled for 06/07/23 at 1130, consult to follow at 12pm with Dr. Karma Greaser.   Patient agreeable to date and time.   Encounter closed.

## 2023-05-10 NOTE — Patient Instructions (Signed)
 Medication Instructions:  Your physician recommends that you continue on your current medications as directed. Please refer to the Current Medication list given to you today.  *If you need a refill on your cardiac medications before your next appointment, please call your pharmacy*  Lab Work: None ordered today. If you have labs (blood work) drawn today and your tests are completely normal, you will receive your results only by: MyChart Message (if you have MyChart) OR A paper copy in the mail If you have any lab test that is abnormal or we need to change your treatment, we will call you to review the results.  Testing/Procedures: Your physician has requested that you have an echocardiogram prior to 6 month follow-up with Dr. Odis Hollingshead. Echocardiography is a painless test that uses sound waves to create images of your heart. It provides your doctor with information about the size and shape of your heart and how well your heart's chambers and valves are working. This procedure takes approximately one hour. There are no restrictions for this procedure. Please do NOT wear cologne, perfume, aftershave, or lotions (deodorant is allowed). Please arrive 15 minutes prior to your appointment time.  Please note: We ask at that you not bring children with you during ultrasound (echo/ vascular) testing. Due to room size and safety concerns, children are not allowed in the ultrasound rooms during exams. Our front office staff cannot provide observation of children in our lobby area while testing is being conducted. An adult accompanying a patient to their appointment will only be allowed in the ultrasound room at the discretion of the ultrasound technician under special circumstances. We apologize for any inconvenience.   Follow-Up: At [facility] you and your health needs are our priority.  As part of our continuing mission to provide you with exceptional heart care, we have created designated Provider Care  Teams.  These Care Teams include your primary Cardiologist (physician) and Advanced Practice Providers (APPs -  Physician Assistants and Nurse Practitioners) who all work together to provide you with the care you need, when you need it.  We recommend signing up for the patient portal called "MyChart".  Sign up information is provided on this After Visit Summary.  MyChart is used to connect with patients for Virtual Visits (Telemedicine).  Patients are able to view lab/test results, encounter notes, upcoming appointments, etc.  Non-urgent messages can be sent to your provider as well.   To learn more about what you can do with MyChart, go to [email or web address].    Your next appointment:   6 month(s)  The format for your next appointment:   In Person  Provider:   Tessa Lerner, DO {

## 2023-05-10 NOTE — Progress Notes (Signed)
 Cardiology Office Note:  .   Date:  05/10/2023  ID:  Kimberly Crosby, DOB Jul 13, 1940, MRN [id number] PCP:  Benjamine Aland, MD  Former Cardiology Providers: Dr. Gordy Bergamo, Emmalene Lawrence, APRN, FNP-C Pulmonologist: Dr. Shellia Medical oncologist: Dr. Arley [facility] Providers Cardiologist:  Madonna Large, DO , [facility] (established care 02/26/2020) Electrophysiologist:  None  Click to update primary MD,subspecialty MD or APP then REFRESH:1}    Chief Complaint  Patient presents with   Chronic heart failure with preserved ejection fraction   Follow-up    History of Present Illness: Kimberly   Kimberly Crosby is a 83 y.o. African-American female whose past medical history and cardiovascular risk factors includes: Hx of Large right-sided pulmonary embolism extending from the right main pulmonary artery into lobar, segmental and subsegmental sized branches in the right lung (08/27/2020), aortic atherosclerosis, hypertension, obesity, chronic diastolic heart failure, Former smoker, advanced age, postmenopausal female.   Patient is referred by the practice for HFpEF and essential hypertension.  Since last office visit she is doing well from a cardiovascular standpoint.  Denies anginal chest pain or heart failure symptoms.  No hospitalizations or urgent care visits for cardiovascular reasons since last office encounter.  Review of Systems: .   Review of Systems  Cardiovascular:  Negative for chest pain, claudication, irregular heartbeat, leg swelling, near-syncope, orthopnea, palpitations, paroxysmal nocturnal dyspnea and syncope.  Respiratory:  Negative for shortness of breath.   Hematologic/Lymphatic: Negative for bleeding problem.    Studies Reviewed:   EKG: EKG Interpretation Date/Time:  Thursday May 10 2023 14:23:09 EST Ventricular Rate:  79 PR Interval:  156 QRS Duration:  72 QT Interval:  376 QTC Calculation: 431 R Axis:   17  Text Interpretation: Normal sinus rhythm Normal  ECG When compared with ECG of 04-May-2022 04:43, Premature atrial complexes is no longer present Otherwise no significant change Confirmed by Large Madonna ([id number]) on 05/10/2023 2:34:14 PM  Echocardiogram: April 2022: LVEF 65 to 70%, grade 1 diastolic dysfunction. Right ventricular size mildly enlarged, right ventricular systolic function normal. Estimated RVSP 34.6 mmHg. Estimated RAP 3 mmHg. See report for additional details  Stress Testing: Lexiscan  stress test May 2022: Low risk study  RADIOLOGY: CTA PE Protocol  08/27/2020: 1. Large right-sided pulmonary embolism extending from the right main pulmonary artery into lobar, segmental and subsegmental sized branches in the right lung. 2. Mild cardiomegaly. 3. Aortic atherosclerosis.  Risk Assessment/Calculations:   N/A   Labs:       Latest Ref Rng & Units 05/03/2022    8:04 PM 05/03/2022    6:15 PM 08/28/2020   11:49 PM  CBC  WBC 4.0 - 10.5 K/uL  9.0  9.2   Hemoglobin 12.0 - 15.0 g/dL 86.6  87.0    87.1  88.2   Hematocrit 36.0 - 46.0 % 39.0  38.0    40.5  35.7   Platelets 150 - 400 K/uL  255  236        Latest Ref Rng & Units 04/10/2023    1:41 PM 05/03/2022    8:04 PM 05/03/2022    6:15 PM  BMP  Glucose 70 - 99 mg/dL 93  895  893    883   BUN 8 - 27 mg/dL 17  15  13    14    Creatinine 0.57 - 1.00 mg/dL 9.00  9.09  8.91    8.99   BUN/Creat Ratio 12 - 28 17     Sodium 134 -  144 mmol/L 145  143  139    141   Potassium 3.5 - 5.2 mmol/L 3.9  4.0  4.2    3.9   Chloride 96 - 106 mmol/L 109  108  113    109   CO2 20 - 29 mmol/L 21   19   Calcium 8.7 - 10.3 mg/dL 9.5   9.3       Latest Ref Rng & Units 04/10/2023    1:41 PM 05/03/2022    8:04 PM 05/03/2022    6:15 PM  CMP  Glucose 70 - 99 mg/dL 93  895  893    883   BUN 8 - 27 mg/dL 17  15  13    14    Creatinine 0.57 - 1.00 mg/dL 9.00  9.09  8.91    8.99   Sodium 134 - 144 mmol/L 145  143  139    141   Potassium 3.5 - 5.2 mmol/L 3.9  4.0  4.2    3.9    Chloride 96 - 106 mmol/L 109  108  113    109   CO2 20 - 29 mmol/L 21   19   Calcium 8.7 - 10.3 mg/dL 9.5   9.3   Total Protein 6.0 - 8.5 g/dL 6.9   7.1   Total Bilirubin 0.0 - 1.2 mg/dL 0.7   0.7   Alkaline Phos 44 - 121 IU/L 97   78   AST 0 - 40 IU/L 16   17   ALT 0 - 32 IU/L 14   11     Lab Results  Component Value Date   CHOL 165 04/10/2023   HDL 68 04/10/2023   LDLCALC 86 04/10/2023   LDLDIRECT 98 04/10/2023   TRIG 54 04/10/2023   No results for input(s): LIPOA in the last 8760 hours. No components found for: NTPROBNP Recent Labs    04/10/23 1341  PROBNP 106   No results for input(s): TSH in the last 8760 hours.   Physical Exam:    Today's Vitals   05/10/23 1425  BP: 138/86  Pulse: 81  Resp: 16  SpO2: 97%  Weight: 250 lb (113.4 kg)  Height: 5' 1 (1.549 m)   Body mass index is 47.24 kg/m. Wt Readings from Last 3 Encounters:  05/10/23 250 lb (113.4 kg)  03/20/23 245 lb (111.1 kg)  10/10/22 251 lb (113.9 kg)    Physical Exam  Constitutional: No distress.  Age appropriate, hemodynamically stable.   Neck: No JVD present.  Cardiovascular: Normal rate, regular rhythm, S1 normal, S2 normal, intact distal pulses and normal pulses. Exam reveals no gallop, no S3 and no S4.  No murmur heard. Pulmonary/Chest: Effort normal and breath sounds normal. No stridor. She has no wheezes. She has no rales.  Abdominal: Soft. Bowel sounds are normal. She exhibits no distension. There is no abdominal tenderness.  Abdominal obesity  Musculoskeletal:        General: No edema.     Cervical back: Neck supple.  Neurological: She is alert and oriented to person, place, and time. She has intact cranial nerves (2-12).  Skin: Skin is warm and moist.   Impression & Recommendation(s):  Impression:   ICD-10-CM   1. Chronic heart failure with preserved ejection fraction (HFpEF) (HCC)  I50.32 EKG 12-Lead    ECHOCARDIOGRAM COMPLETE    2. Essential hypertension  I10     3.  Hx of pulmonary embolus  Z86.711  4. Class 3 severe obesity due to excess calories with serious comorbidity and body mass index (BMI) of 45.0 to 49.9 in adult [facility])  Z33.186    Z68.42    E66.01        Recommendation(s):  Chronic heart failure with preserved ejection fraction (HFpEF) (HCC) Clinically euvolemic. Continue Entresto  97/103 mg p.o. twice daily. Continue spironolactone  25 mg p.o. daily. Continue Toprol -XL 25 mg p.o. daily. Continue Lasix  20 mg p.o. every other day. Plan echocardiogram prior to the next office visit  Essential hypertension Office and home blood pressures are well-controlled. Medications as discussed above. No changes warranted at this time. Reemphasized importance of low-salt diet.  Hx of pulmonary embolus Currently on anticoagulation with Xarelto  20 mg p.o. daily.  Managed by other providers and the care team  Class 3 severe obesity due to excess calories with serious comorbidity and body mass index (BMI) of 45.0 to 49.9 in adult [facility]) Body mass index is 47.24 kg/m. I reviewed with her importance of diet, regular physical activity/exercise, weight loss.   Patient is educated on the importance of increasing physical activity gradually as tolerated with a goal of moderate intensity exercise for 30 minutes a day 5 days a week.  Orders Placed:  Orders Placed This Encounter  Procedures   EKG 12-Lead   ECHOCARDIOGRAM COMPLETE    Standing Status:   Future    Expected Date:   11/07/2023    Expiration Date:   05/09/2024    Where should this test be performed:   [facility])    Does the patient weigh less than or greater than 250 lbs?:   Patient weighs less than 250 lbs             pt weighs exactly 250 lbs    Perflutren DEFINITY (image enhancing agent) should be administered unless hypersensitivity or allergy exist:   Administer Perflutren    Reason for exam-Echo:   Other-Full Diagnosis List    Full ICD-10/Reason for Exam:   (HFpEF)  heart failure with preserved ejection fraction (HCC) [[id number]]     Final Medication List:   No orders of the defined types were placed in this encounter.   There are no discontinued medications.   Current Outpatient Medications:    B Complex Vitamins (B-COMPLEX/B-12 PO), Take by mouth., Disp: , Rfl:    Budeson-Glycopyrrol-Formoterol (BREZTRI  AEROSPHERE) 160-9-4.8 MCG/ACT AERO, Inhale 2 puffs into the lungs in the morning and at bedtime., Disp: 10.7 g, Rfl: 5   EPINEPHrine  (EPIPEN  2-PAK) 0.3 mg/0.3 mL IJ SOAJ injection, Inject 0.3 mg into the muscle as needed for anaphylaxis., Disp: 2 each, Rfl: 1   fexofenadine  (ALLEGRA ) 180 MG tablet, Take 1 tablet (180 mg total) by mouth daily. (Patient taking differently: Take 180 mg by mouth as needed for allergies.), Disp: 30 tablet, Rfl: 5   furosemide  (LASIX ) 20 MG tablet, TAKE 1 TABLET(20 MG) BY MOUTH EVERY OTHER DAY, Disp: 30 tablet, Rfl: 5   latanoprost  (XALATAN ) 0.005 % ophthalmic solution, Place 1 drop into both eyes at bedtime., Disp: , Rfl:    metoprolol  succinate (TOPROL -XL) 25 MG 24 hr tablet, TAKE 1 TABLET(25 MG) BY MOUTH DAILY, Disp: 90 tablet, Rfl: 1   Propylene Glycol (SYSTANE BALANCE OP), Apply 1 drop to eye daily., Disp: , Rfl:    sacubitril -valsartan  (ENTRESTO ) 97-103 MG, Take 1 tablet by mouth 2 (two) times daily., Disp: 60 tablet, Rfl: 12   spironolactone  (ALDACTONE ) 25 MG tablet, TAKE 1 TABLET(25 MG) BY  MOUTH EVERY MORNING, Disp: 90 tablet, Rfl: 3   VITAMIN D  PO, Take by mouth., Disp: , Rfl:    XARELTO  20 MG TABS tablet, Take 20 mg by mouth daily., Disp: , Rfl:   Current Facility-Administered Medications:    denosumab  (PROLIA ) injection 60 mg, 60 mg, Subcutaneous, Once,    omalizumab  (XOLAIR ) injection 300 mg, 300 mg, Subcutaneous, Q28 days, Jeneal Danita Macintosh, MD, 300 mg at 04/25/23 1155  Consent:   N/A  Disposition:   6 months sooner if needed Patient may be asked to follow-up sooner based on the results of the  above-mentioned testing.  Her questions and concerns were addressed to her satisfaction. She voices understanding of the recommendations provided during this encounter.    Signed, Madonna Large, DO, [facility]  [facility]  [address] [place], [place] [address] 05/10/2023 6:51 PM

## 2023-05-18 ENCOUNTER — Other Ambulatory Visit: Payer: Self-pay

## 2023-05-23 ENCOUNTER — Ambulatory Visit (INDEPENDENT_AMBULATORY_CARE_PROVIDER_SITE_OTHER): Payer: Federal, State, Local not specified - PPO | Admitting: *Deleted

## 2023-05-23 DIAGNOSIS — L501 Idiopathic urticaria: Secondary | ICD-10-CM

## 2023-05-24 ENCOUNTER — Other Ambulatory Visit: Payer: Self-pay

## 2023-05-25 ENCOUNTER — Other Ambulatory Visit: Payer: Self-pay | Admitting: Cardiology

## 2023-05-25 DIAGNOSIS — I5032 Chronic diastolic (congestive) heart failure: Secondary | ICD-10-CM

## 2023-06-07 ENCOUNTER — Ambulatory Visit (INDEPENDENT_AMBULATORY_CARE_PROVIDER_SITE_OTHER): Payer: Federal, State, Local not specified - PPO | Admitting: Obstetrics and Gynecology

## 2023-06-07 ENCOUNTER — Encounter: Payer: Self-pay | Admitting: Obstetrics and Gynecology

## 2023-06-07 ENCOUNTER — Ambulatory Visit (INDEPENDENT_AMBULATORY_CARE_PROVIDER_SITE_OTHER): Payer: Federal, State, Local not specified - PPO

## 2023-06-07 VITALS — BP 114/72 | HR 63

## 2023-06-07 DIAGNOSIS — N83299 Other ovarian cyst, unspecified side: Secondary | ICD-10-CM | POA: Diagnosis not present

## 2023-06-07 DIAGNOSIS — D219 Benign neoplasm of connective and other soft tissue, unspecified: Secondary | ICD-10-CM

## 2023-06-07 DIAGNOSIS — N83202 Unspecified ovarian cyst, left side: Secondary | ICD-10-CM | POA: Diagnosis not present

## 2023-06-07 DIAGNOSIS — R102 Pelvic and perineal pain: Secondary | ICD-10-CM

## 2023-06-07 DIAGNOSIS — E2839 Other primary ovarian failure: Secondary | ICD-10-CM

## 2023-06-07 MED ORDER — ROMOSOZUMAB-AQQG 105 MG/1.17ML ~~LOC~~ SOSY
210.0000 mg | PREFILLED_SYRINGE | Freq: Once | SUBCUTANEOUS | Status: AC
  Administered 2023-07-12: 210 mg via SUBCUTANEOUS

## 2023-06-07 NOTE — Progress Notes (Signed)
  Patient presents today for PUS results. She does report period like pain on her left side that comes and goes. She is able to bend and move.  Patient also approved with insurance for [employer]. To begin treatment. GFR normal above 35 and can schedule.  Date: 03/26/2017 Department: [facility] Released By: Marijo Sanes Authorizing: Dara Lords, MD   Exam Status  Status  Final [99]   PACS Intelerad Image Link   Show images for US Transvaginal Non-OB Study Result  Narrative & Impression  Ultrasound transvaginal and transabdominal normal length and echotexture.  Fluid-filled endometrium noted at 20 x 2.9 mm.  Echogenic focus measuring 10 x 7 mm noted.  Small intramural myoma 9 x 9 mm.  Right ovary is normal with small cyst 9 mm.  Left ovary normal with adjacent thin-walled echo-free avascular cyst measuring 37 x 29 x 31 mm.  Cul-de-sac negative.   Ca125 2017 normal at 5  PUS today with enlarging cyst and fibroids.   Retroverted 6.72cm utuers Endometrial lining 2.35mm EM fluid filled with 10x33mm filling defect noted at fundal EM  3 fibroids seen measuring 1.53, 1.21, 0.72cm Left ovarian cyst measuring 5.93cm x 3.89cm complex mostly cystic avascular Right ovarian cyst measuring 19 x 16mm simple avascular  No free fluid   A/p-pelvic pain, enlarging left ovarian cyst and fibroids in menopause To get OVA 1 today. RTC in 2 weeks to discuss results. Discussed concern for torsion or evolving torsion with pain on that side and discussed s/s and when to return.  Discussed RLH/BSO as definitive way to ensure the cyst is benign and hopefully to avoid torsion and an emergency case.  Discussed the procedure in detail.  Encouraged patient to bring family next visit to revisit this information. To run PA for this. To schedule evenity dosing for bone support.  Continue calcium and vit D as side effect is low calcium and vit D on this.  Dr. Karma Greaser 30 minutes  spent on reviewing records, imaging,  and one on one patient time and counseling patient and documentation Dr. Karma Greaser

## 2023-06-07 NOTE — Telephone Encounter (Signed)
Insurance information submitted to [employer] portal for benefits for new calendar year. Will await summary of benefits for Evenity.

## 2023-06-07 NOTE — Patient Instructions (Addendum)
Take vit D3 (2000 international units a day) and calcium 1200mg  daily(can subtract daily food source) Counseled on evenity infusions for a year followed by prolia injections.  Discussed she needs a holiday from fosomax with max use 3-5 years.  Can return after a holiday.  Discussed advantage of evenity to build bones and improve bone mineral density and lower risk of fractures by also decreasing bone resorption at the same time.  Discussed this is monthly for a year and prolia is Q 6 months injection  Prolia should be continued and risk of stopping increased risk of vetebral fractures. Discussed she will need monitoring for her calcium and vit D while taking the medication Q 6 months, as a side effect is hypocalcemia.  Most common side effect with evenity is headaches and joint pain.  She cannot take if she has chronic kidney disease. Counseled on weight bearing exercises with walking, weight bearing, swimming

## 2023-06-13 ENCOUNTER — Other Ambulatory Visit (HOSPITAL_COMMUNITY)
Admission: RE | Admit: 2023-06-13 | Discharge: 2023-06-13 | Disposition: A | Discharge status: Home / Self Care | Payer: Medicare Other | Source: Ambulatory Visit | Attending: Obstetrics | Admitting: Obstetrics

## 2023-06-13 ENCOUNTER — Ambulatory Visit (INDEPENDENT_AMBULATORY_CARE_PROVIDER_SITE_OTHER): Payer: Medicare Other | Admitting: Obstetrics

## 2023-06-13 ENCOUNTER — Encounter: Payer: Self-pay | Admitting: Obstetrics and Gynecology

## 2023-06-13 ENCOUNTER — Encounter: Payer: Self-pay | Admitting: Obstetrics

## 2023-06-13 ENCOUNTER — Ambulatory Visit: Payer: Federal, State, Local not specified - PPO

## 2023-06-13 VITALS — BP 113/71 | HR 75 | Ht 62.0 in | Wt 249.0 lb

## 2023-06-13 DIAGNOSIS — N898 Other specified noninflammatory disorders of vagina: Secondary | ICD-10-CM | POA: Diagnosis not present

## 2023-06-13 DIAGNOSIS — N3946 Mixed incontinence: Secondary | ICD-10-CM | POA: Diagnosis not present

## 2023-06-13 DIAGNOSIS — R3914 Feeling of incomplete bladder emptying: Secondary | ICD-10-CM | POA: Insufficient documentation

## 2023-06-13 DIAGNOSIS — R159 Full incontinence of feces: Secondary | ICD-10-CM | POA: Insufficient documentation

## 2023-06-13 DIAGNOSIS — R102 Pelvic and perineal pain: Secondary | ICD-10-CM | POA: Diagnosis not present

## 2023-06-13 DIAGNOSIS — R351 Nocturia: Secondary | ICD-10-CM | POA: Diagnosis not present

## 2023-06-13 LAB — POCT URINALYSIS DIPSTICK
Bilirubin, UA: NEGATIVE
Blood, UA: NEGATIVE
Glucose, UA: NEGATIVE
Ketones, UA: NEGATIVE
Leukocytes, UA: NEGATIVE
Nitrite, UA: NEGATIVE
Protein, UA: NEGATIVE
Spec Grav, UA: 1.02 (ref 1.010–1.025)
Urobilinogen, UA: 0.2 U/dL
pH, UA: 5.5 (ref 5.0–8.0)

## 2023-06-13 LAB — OVARIAN MALIGNANCY RISK-ROMA
CA125: 8 U/mL (ref <35)
HE4, OVARIAN CANCER MONITORING: 77 pmol/L
ROMA Postmenopausal: 1.16 (ref <2.77)
ROMA Premenopausal: 1.78 — ABNORMAL HIGH (ref <1.31)

## 2023-06-13 MED ORDER — GEMTESA 75 MG PO TABS: 75.0000 mg | ORAL_TABLET | Freq: Every day | ORAL | 2 refills | Status: DC

## 2023-06-13 MED ORDER — GEMTESA 75 MG PO TABS: 75.0000 mg | ORAL_TABLET | Freq: Every day | ORAL | Status: DC

## 2023-06-13 NOTE — Assessment & Plan Note (Signed)
 Accidental Bowel Leakage:  - Treatment options include anti-diarrhea medication (loperamide/ Imodium OTC or prescription lomotil), fiber supplements, physical therapy, and possible sacral neuromodulation or surgery.   - referral for pelvic floor PT

## 2023-06-13 NOTE — Assessment & Plan Note (Signed)
-   POCT UA negative, PVR 60mL - urgency bother > stress - We discussed the symptoms of overactive bladder (OAB), which include urinary urgency, urinary frequency, nocturia, with or without urge incontinence.  While we do not know the exact etiology of OAB, several treatment options exist. We discussed management including behavioral therapy (decreasing bladder irritants, urge suppression strategies, timed voids, bladder retraining), physical therapy, medication; for refractory cases posterior tibial nerve stimulation, sacral neuromodulation, and intravesical botulinum toxin injection.  For anticholinergic medications, we discussed the potential side effects of anticholinergics including dry eyes, dry mouth, constipation, cognitive impairment and urinary retention. For Beta-3 agonist medication, we discussed the potential side effect of elevated blood pressure which is more likely to occur in individuals with uncontrolled hypertension. - samples and Rx for Gemtesa  - reviewed behavioral modifications - referral to pelvic floor PT For treatment of stress urinary incontinence,  non-surgical options include expectant management, weight loss, physical therapy, as well as a pessary.  Surgical options include a midurethral sling, Burch urethropexy, and transurethral injection of a bulking agent.

## 2023-06-13 NOTE — Assessment & Plan Note (Signed)
 For night time frequency: - avoid fluid intake after 6pm - elevated your feet during the day or use compression socks to reduce lower extremity swelling - switch your diuretic (e.g. furosemide ) dosing to 2pm - continue CPAP for sleep apnea
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
## 2023-06-13 NOTE — Assessment & Plan Note (Signed)
-   pending Nuswab
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
## 2023-06-13 NOTE — Patient Instructions (Addendum)
 We discussed the symptoms of overactive bladder (OAB), which include urinary urgency, urinary frequency, night-time urination, with or without urge incontinence.  We discussed management including behavioral therapy (decreasing bladder irritants by following a bladder diet, urge suppression strategies, timed voids, bladder retraining), physical therapy, medication; and for refractory cases posterior tibial nerve stimulation, sacral neuromodulation, and intravesical botulinum toxin injection.   For Beta-3 agonist medication, we discussed the potential side effect of elevated blood pressure which is more likely to occur in individuals with uncontrolled hypertension. You were given samples for Gemtesa  75 mg.  It can take a month to start working so give it time, but if you have bothersome side effects call sooner and we can try a different medication.  Call us  if you have trouble filling the prescription or if it's not covered by your insurance.  For night time frequency: - avoid fluid intake after 6pm - elevated your feet during the day or use compression socks to reduce lower extremity swelling - switch your diuretic (e.g. furosemide ) dosing to 2pm - continue CPAP for sleep apnea  For treatment of stress urinary incontinence,  non-surgical options include expectant management, weight loss, physical therapy, as well as a pessary.  Surgical options include a midurethral sling, Burch urethropexy, and transurethral injection of a bulking agent.  Accidental Bowel Leakage:  - Treatment options include anti-diarrhea medication (loperamide/ Imodium OTC or prescription lomotil), fiber supplements, physical therapy, and possible sacral neuromodulation or surgery.     The origin of pelvic floor muscle spasm can be multifactorial, including primary, reactive to a different pain source, trauma, or even part of a centralized pain syndrome.Treatment options include pelvic floor physical therapy, local (vaginal) or  oral  muscle relaxants, pelvic muscle trigger point injections or centrally acting pain medications.

## 2023-06-14 ENCOUNTER — Other Ambulatory Visit: Payer: Self-pay | Admitting: Obstetrics

## 2023-06-14 DIAGNOSIS — N83299 Other ovarian cyst, unspecified side: Secondary | ICD-10-CM | POA: Diagnosis not present

## 2023-06-14 DIAGNOSIS — N898 Other specified noninflammatory disorders of vagina: Secondary | ICD-10-CM

## 2023-06-14 DIAGNOSIS — I110 Hypertensive heart disease with heart failure: Secondary | ICD-10-CM | POA: Diagnosis not present

## 2023-06-14 DIAGNOSIS — E7800 Pure hypercholesterolemia, unspecified: Secondary | ICD-10-CM | POA: Diagnosis not present

## 2023-06-14 DIAGNOSIS — I5032 Chronic diastolic (congestive) heart failure: Secondary | ICD-10-CM | POA: Diagnosis not present

## 2023-06-14 DIAGNOSIS — Z6841 Body Mass Index (BMI) 40.0 and over, adult: Secondary | ICD-10-CM | POA: Diagnosis not present

## 2023-06-14 DIAGNOSIS — Z86711 Personal history of pulmonary embolism: Secondary | ICD-10-CM | POA: Diagnosis not present

## 2023-06-14 DIAGNOSIS — E669 Obesity, unspecified: Secondary | ICD-10-CM | POA: Diagnosis not present

## 2023-06-14 DIAGNOSIS — I10 Essential (primary) hypertension: Secondary | ICD-10-CM | POA: Diagnosis not present

## 2023-06-14 DIAGNOSIS — E1169 Type 2 diabetes mellitus with other specified complication: Secondary | ICD-10-CM | POA: Diagnosis not present

## 2023-06-14 LAB — CERVICOVAGINAL ANCILLARY ONLY
Bacterial Vaginitis (gardnerella): POSITIVE — AB
Candida Glabrata: NEGATIVE
Candida Vaginitis: NEGATIVE
Comment: NEGATIVE
Comment: NEGATIVE
Comment: NEGATIVE

## 2023-06-14 MED ORDER — METRONIDAZOLE 500 MG PO TABS: 500.0000 mg | ORAL_TABLET | Freq: Two times a day (BID) | ORAL | 0 refills | Status: AC

## 2023-06-19 NOTE — Telephone Encounter (Signed)
Message left to return call to [employer] at [phone].

## 2023-06-19 NOTE — Telephone Encounter (Signed)
 Deductible:  $257 of $257 met   OOP MAX: n/a  Annual exam: 03-20-23 EB  Calcium:    9.5        Date: 04/10/24 eGFR: 57               Date: 04/10/24  Upcoming dental procedures: No   Hx of Kidney Disease: No   Hx of Heart attack or stroke in the last year: No  Last Bone Density Scan: 03/21/22   Is Prior Authorization needed: no  Pt estimated Cost: $0    Coverage Details:This is a [employer] that covers the Medicare Part B coinsurance and deductible.

## 2023-06-20 ENCOUNTER — Ambulatory Visit (INDEPENDENT_AMBULATORY_CARE_PROVIDER_SITE_OTHER): Payer: Medicare Other

## 2023-06-20 DIAGNOSIS — L501 Idiopathic urticaria: Secondary | ICD-10-CM

## 2023-07-05 MED ORDER — ROMOSOZUMAB-AQQG 105 MG/1.17ML ~~LOC~~ SOSY
210.0000 mg | PREFILLED_SYRINGE | Freq: Once | SUBCUTANEOUS | Status: AC
Start: 2023-07-19 — End: ?

## 2023-07-05 NOTE — Telephone Encounter (Signed)
 Message left to return call to [employer] at [phone].

## 2023-07-05 NOTE — Telephone Encounter (Signed)
 Returned call to patient. Reviewed summary of benefits for Evenity with patient as seen below. Patient verbalized understanding. Patient schedule for first injection on 07/12/23 at 1015. Patient agreeable to date and time of appointment.Order placed and summary of benefits scanned into Epic.   Encounter closed.

## 2023-07-05 NOTE — Telephone Encounter (Signed)
 Returned call to patient as she had left voicemail. Advised patient would need to resubmit insurance to [employer] since new calendar year had started for benefits. Patient agreeable.   Insurance information submitted to [employer] portal. Will await summary of benefits for Evenity.

## 2023-07-09 ENCOUNTER — Other Ambulatory Visit: Payer: Self-pay | Admitting: Family Medicine

## 2023-07-09 ENCOUNTER — Ambulatory Visit
Admission: RE | Admit: 2023-07-09 | Discharge: 2023-07-09 | Disposition: A | Discharge status: Home / Self Care | Source: Ambulatory Visit | Attending: Family Medicine | Admitting: Family Medicine

## 2023-07-09 DIAGNOSIS — R0602 Shortness of breath: Secondary | ICD-10-CM

## 2023-07-09 DIAGNOSIS — R059 Cough, unspecified: Secondary | ICD-10-CM | POA: Diagnosis not present

## 2023-07-09 DIAGNOSIS — I5032 Chronic diastolic (congestive) heart failure: Secondary | ICD-10-CM | POA: Diagnosis not present

## 2023-07-09 DIAGNOSIS — J45909 Unspecified asthma, uncomplicated: Secondary | ICD-10-CM | POA: Diagnosis not present

## 2023-07-09 DIAGNOSIS — I110 Hypertensive heart disease with heart failure: Secondary | ICD-10-CM | POA: Diagnosis not present

## 2023-07-12 ENCOUNTER — Ambulatory Visit: Payer: Medicare Other

## 2023-07-12 DIAGNOSIS — M810 Age-related osteoporosis without current pathological fracture: Secondary | ICD-10-CM | POA: Diagnosis not present

## 2023-07-12 NOTE — Progress Notes (Signed)
 Evenity injections given SQ (105 mg given SQ left arm and 105 mg given SQ right arm).  Patient tolerated injections well.  Last AEX 03/20/23 with Dr. Karma Greaser

## 2023-07-16 DIAGNOSIS — I110 Hypertensive heart disease with heart failure: Secondary | ICD-10-CM | POA: Diagnosis not present

## 2023-07-16 DIAGNOSIS — J9801 Acute bronchospasm: Secondary | ICD-10-CM | POA: Diagnosis not present

## 2023-07-16 DIAGNOSIS — I5032 Chronic diastolic (congestive) heart failure: Secondary | ICD-10-CM | POA: Diagnosis not present

## 2023-07-18 ENCOUNTER — Ambulatory Visit (INDEPENDENT_AMBULATORY_CARE_PROVIDER_SITE_OTHER): Payer: Federal, State, Local not specified - PPO | Admitting: *Deleted

## 2023-07-18 DIAGNOSIS — L501 Idiopathic urticaria: Secondary | ICD-10-CM

## 2023-07-30 DIAGNOSIS — J302 Other seasonal allergic rhinitis: Secondary | ICD-10-CM | POA: Diagnosis not present

## 2023-07-30 DIAGNOSIS — J219 Acute bronchiolitis, unspecified: Secondary | ICD-10-CM | POA: Diagnosis not present

## 2023-08-01 DIAGNOSIS — H35033 Hypertensive retinopathy, bilateral: Secondary | ICD-10-CM | POA: Diagnosis not present

## 2023-08-01 DIAGNOSIS — H52203 Unspecified astigmatism, bilateral: Secondary | ICD-10-CM | POA: Diagnosis not present

## 2023-08-01 DIAGNOSIS — H43813 Vitreous degeneration, bilateral: Secondary | ICD-10-CM | POA: Diagnosis not present

## 2023-08-01 DIAGNOSIS — H401131 Primary open-angle glaucoma, bilateral, mild stage: Secondary | ICD-10-CM | POA: Diagnosis not present

## 2023-08-01 DIAGNOSIS — H04123 Dry eye syndrome of bilateral lacrimal glands: Secondary | ICD-10-CM | POA: Diagnosis not present

## 2023-08-13 DIAGNOSIS — E1169 Type 2 diabetes mellitus with other specified complication: Secondary | ICD-10-CM | POA: Diagnosis not present

## 2023-08-13 DIAGNOSIS — Z6841 Body Mass Index (BMI) 40.0 and over, adult: Secondary | ICD-10-CM | POA: Diagnosis not present

## 2023-08-13 DIAGNOSIS — I952 Hypotension due to drugs: Secondary | ICD-10-CM | POA: Diagnosis not present

## 2023-08-13 DIAGNOSIS — I10 Essential (primary) hypertension: Secondary | ICD-10-CM | POA: Diagnosis not present

## 2023-08-13 DIAGNOSIS — Z72820 Sleep deprivation: Secondary | ICD-10-CM | POA: Diagnosis not present

## 2023-08-15 ENCOUNTER — Ambulatory Visit (INDEPENDENT_AMBULATORY_CARE_PROVIDER_SITE_OTHER)

## 2023-08-15 DIAGNOSIS — L501 Idiopathic urticaria: Secondary | ICD-10-CM | POA: Diagnosis not present

## 2023-09-04 DIAGNOSIS — J9801 Acute bronchospasm: Secondary | ICD-10-CM | POA: Diagnosis not present

## 2023-09-04 DIAGNOSIS — I10 Essential (primary) hypertension: Secondary | ICD-10-CM | POA: Diagnosis not present

## 2023-09-06 ENCOUNTER — Ambulatory Visit: Admitting: Physical Therapy

## 2023-09-10 ENCOUNTER — Ambulatory Visit: Payer: Medicare Other | Admitting: Obstetrics

## 2023-09-12 ENCOUNTER — Ambulatory Visit

## 2023-09-12 DIAGNOSIS — L501 Idiopathic urticaria: Secondary | ICD-10-CM | POA: Diagnosis not present

## 2023-09-17 ENCOUNTER — Ambulatory Visit: Admitting: Obstetrics

## 2023-09-18 ENCOUNTER — Ambulatory Visit: Admitting: Obstetrics

## 2023-09-18 ENCOUNTER — Encounter: Payer: Self-pay | Admitting: *Deleted

## 2023-09-18 DIAGNOSIS — R635 Abnormal weight gain: Secondary | ICD-10-CM | POA: Diagnosis not present

## 2023-09-18 DIAGNOSIS — Z6841 Body Mass Index (BMI) 40.0 and over, adult: Secondary | ICD-10-CM | POA: Diagnosis not present

## 2023-09-19 ENCOUNTER — Encounter: Payer: Self-pay | Admitting: Obstetrics

## 2023-09-19 ENCOUNTER — Ambulatory Visit: Admitting: Obstetrics

## 2023-09-19 VITALS — BP 117/56 | HR 72

## 2023-09-19 DIAGNOSIS — R3914 Feeling of incomplete bladder emptying: Secondary | ICD-10-CM | POA: Diagnosis not present

## 2023-09-19 DIAGNOSIS — R102 Pelvic and perineal pain: Secondary | ICD-10-CM

## 2023-09-19 DIAGNOSIS — R351 Nocturia: Secondary | ICD-10-CM

## 2023-09-19 DIAGNOSIS — R159 Full incontinence of feces: Secondary | ICD-10-CM | POA: Diagnosis not present

## 2023-09-19 DIAGNOSIS — N3946 Mixed incontinence: Secondary | ICD-10-CM | POA: Diagnosis not present

## 2023-09-19 MED ORDER — MIRABEGRON ER 25 MG PO TB24: 25.0000 mg | ORAL_TABLET | Freq: Every day | ORAL | 0 refills | Status: DC
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
## 2023-09-19 NOTE — Patient Instructions (Signed)
 For Beta-3 agonist medication, we discussed the potential side effect of elevated blood pressure which is more likely to occur in individuals with uncontrolled hypertension. You were given prescription for Myrbetriq 25 mg.  It can take a month to start working so give it time, but if you have bothersome side effects call sooner and we can try a different medication.  Call us  if you have trouble filling the prescription or if it's not covered by your insurance.  Please check your blood pressure and continue if your blood pressure remains normal at 110s/70s.  Start at 25mg  daily for 1 month, if your blood pressure remains unchanged, increase to 50mg  after 1 month and continue to monitor your blood pressure.  Continue CPAP use for sleep apnea.   Please call [phone] to reschedule the earliest appointment for pelvic floor PT.  Continue loperamide use as needed and diet modification for bowel leakage.

## 2023-09-19 NOTE — Assessment & Plan Note (Signed)
-   reduced to 1x/night on Gemtesa , cost prohibitive - Rx mirabegron to reassess clinical change - avoid fluid intake after 6pm - continue to elevate feet during the day or use compression socks to reduce lower extremity swelling - continue diuretic (e.g. furosemide ) dosing at 2pm - continue CPAP for sleep apnea

## 2023-09-19 NOTE — Assessment & Plan Note (Signed)
-   06/13/23 POCT UA negative, PVR 60mL - urgency bother > stress - We discussed the symptoms of overactive bladder (OAB), which include urinary urgency, urinary frequency, nocturia, with or without urge incontinence.  While we do not know the exact etiology of OAB, several treatment options exist. We discussed management including behavioral therapy (decreasing bladder irritants, urge suppression strategies, timed voids, bladder retraining), physical therapy, medication; for refractory cases posterior tibial nerve stimulation, sacral neuromodulation, and intravesical botulinum toxin injection.  For anticholinergic medications, we discussed the potential side effects of anticholinergics including dry eyes, dry mouth, constipation, cognitive impairment and urinary retention. For Beta-3 agonist medication, we discussed the potential side effect of elevated blood pressure which is more likely to occur in individuals with uncontrolled hypertension. - Gemtesa  with relief, cost prohibitive - Rx mirabegron, pt to start BP monitoring and increase to 50mg  in 4 weeks if BP WNL - continue behavioral modifications - pending referral to pelvic floor PT For treatment of stress urinary incontinence,  non-surgical options include expectant management, weight loss, physical therapy, as well as a pessary.  Surgical options include a midurethral sling, Burch urethropexy, and transurethral injection of a bulking agent.
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
## 2023-09-19 NOTE — Assessment & Plan Note (Signed)
-   baseline infrequent symptoms 3-4x/year, denies FI since last visit - continue lactose free diet and continue loperamide PRN symptoms - Treatment options include anti-diarrhea medication (loperamide/ Imodium OTC or prescription lomotil), fiber supplements, physical therapy, and possible sacral neuromodulation or surgery.   - pending referral for pelvic floor PT

## 2023-09-20 ENCOUNTER — Other Ambulatory Visit: Payer: Self-pay | Admitting: *Deleted

## 2023-09-20 ENCOUNTER — Encounter: Payer: Self-pay | Admitting: Obstetrics and Gynecology

## 2023-09-20 ENCOUNTER — Ambulatory Visit (INDEPENDENT_AMBULATORY_CARE_PROVIDER_SITE_OTHER): Admitting: Obstetrics and Gynecology

## 2023-09-20 ENCOUNTER — Ambulatory Visit (INDEPENDENT_AMBULATORY_CARE_PROVIDER_SITE_OTHER): Admitting: *Deleted

## 2023-09-20 VITALS — BP 110/60 | HR 64

## 2023-09-20 DIAGNOSIS — M810 Age-related osteoporosis without current pathological fracture: Secondary | ICD-10-CM

## 2023-09-20 DIAGNOSIS — N83202 Unspecified ovarian cyst, left side: Secondary | ICD-10-CM | POA: Diagnosis not present

## 2023-09-20 DIAGNOSIS — M816 Localized osteoporosis [Lequesne]: Secondary | ICD-10-CM

## 2023-09-20 DIAGNOSIS — N83201 Unspecified ovarian cyst, right side: Secondary | ICD-10-CM | POA: Diagnosis not present

## 2023-09-20 MED ORDER — ROMOSOZUMAB-AQQG 105 MG/1.17ML ~~LOC~~ SOSY
210.0000 mg | PREFILLED_SYRINGE | SUBCUTANEOUS | Status: AC
Start: 2023-09-20 — End: 2024-02-05
  Administered 2023-09-20 – 2024-02-05 (×5): 210 mg via SUBCUTANEOUS

## 2023-09-20 NOTE — Progress Notes (Signed)
 Patient here for OV with Dr. Tia Flowers and advised needs to continue Evenity  injections. Last injection 07/11/23. See SOB from initial referral.   Patient received 2nd evenity  injection. One in left arm and one in right arm and tolerated wells. Precautions given. Patient advised to report any redness at injection site to office. Patient agreeable.   Routing to provider and will close encounter.
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
## 2023-10-08 ENCOUNTER — Ambulatory Visit (HOSPITAL_COMMUNITY)
Admission: RE | Admit: 2023-10-08 | Discharge: 2023-10-08 | Disposition: A | Discharge status: Home / Self Care | Payer: Medicare Other | Source: Ambulatory Visit | Attending: Cardiovascular Disease | Admitting: Cardiovascular Disease

## 2023-10-08 DIAGNOSIS — I5032 Chronic diastolic (congestive) heart failure: Secondary | ICD-10-CM | POA: Diagnosis not present

## 2023-10-08 LAB — ECHOCARDIOGRAM COMPLETE
Area-P 1/2: 3.43 cm2
S' Lateral: 2.4 cm

## 2023-10-10 ENCOUNTER — Ambulatory Visit

## 2023-10-10 DIAGNOSIS — L501 Idiopathic urticaria: Secondary | ICD-10-CM | POA: Diagnosis not present

## 2023-10-12 ENCOUNTER — Ambulatory Visit: Payer: Self-pay | Admitting: Cardiology

## 2023-10-19 ENCOUNTER — Other Ambulatory Visit: Payer: Self-pay | Admitting: Obstetrics

## 2023-10-19 DIAGNOSIS — R351 Nocturia: Secondary | ICD-10-CM

## 2023-10-19 DIAGNOSIS — N3946 Mixed incontinence: Secondary | ICD-10-CM

## 2023-10-22 ENCOUNTER — Ambulatory Visit (INDEPENDENT_AMBULATORY_CARE_PROVIDER_SITE_OTHER): Admitting: *Deleted

## 2023-10-22 VITALS — BP 130/64 | HR 64 | Resp 14

## 2023-10-22 DIAGNOSIS — M810 Age-related osteoporosis without current pathological fracture: Secondary | ICD-10-CM | POA: Diagnosis not present

## 2023-10-22 NOTE — Progress Notes (Signed)
 Patient in today for third Evenity  injection. Patient's initial calcium level was obtained on 04/11/23.  Result: 9.5.  Last AEX: 03/20/23 EB  Last BMD: 03/21/22  Injection given in right and left arm.  Patient tolerated injection well.  Routed to provider for review.   Patient last had labs in 04/2023, but just completed 3rd Evenity  injection-- does patient need to come in for update labs or wait until 6th injection? Please advise.

## 2023-11-07 ENCOUNTER — Ambulatory Visit

## 2023-11-07 DIAGNOSIS — L501 Idiopathic urticaria: Secondary | ICD-10-CM

## 2023-11-16 ENCOUNTER — Encounter: Payer: Self-pay | Admitting: Obstetrics

## 2023-11-16 ENCOUNTER — Ambulatory Visit (INDEPENDENT_AMBULATORY_CARE_PROVIDER_SITE_OTHER): Admitting: Obstetrics

## 2023-11-16 VITALS — BP 96/65 | HR 61

## 2023-11-16 DIAGNOSIS — R102 Pelvic and perineal pain: Secondary | ICD-10-CM

## 2023-11-16 DIAGNOSIS — N3946 Mixed incontinence: Secondary | ICD-10-CM

## 2023-11-16 DIAGNOSIS — R3914 Feeling of incomplete bladder emptying: Secondary | ICD-10-CM

## 2023-11-16 LAB — POCT URINALYSIS DIP (CLINITEK)
Blood, UA: NEGATIVE
Glucose, UA: NEGATIVE mg/dL
Ketones, POC UA: NEGATIVE mg/dL
Leukocytes, UA: NEGATIVE
Nitrite, UA: NEGATIVE
POC PROTEIN,UA: NEGATIVE
Spec Grav, UA: 1.025 (ref 1.010–1.025)
Urobilinogen, UA: 1 U/dL
pH, UA: 6 (ref 5.0–8.0)

## 2023-11-16 MED ORDER — DICLOFENAC SODIUM 1 % EX GEL: 2.0000 g | Freq: Four times a day (QID) | CUTANEOUS | 1 refills | Status: AC
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
## 2023-11-16 NOTE — Assessment & Plan Note (Signed)
-   catheterized for 80mL  - repeat if clinical change
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
## 2023-11-16 NOTE — Patient Instructions (Addendum)
 Continue Mirabegron  25mg  daily for your urinary symptoms.   Please call if you experience any change in urinary symptoms.  Please call [phone] to reschedule the earliest appointment for pelvic floor physical therapy.  Use Voltaren  gel over lower abdominal, up to 2g 4 times a day as needed for pain.

## 2023-11-16 NOTE — Assessment & Plan Note (Addendum)
-   06/13/23 POCT UA negative, PVR 60mL, repeat UA + bilirubin today. Denies UTI symptoms - urgency bother > stress resolved with mirabegron  and discontinued pad use. Reports lower abdominal soreness 2/10 since starting mirabegron . Denies N/V or fever. Encouraged to consider discontinuation of mirabegron  to consider reassess of pain.  - We discussed the symptoms of overactive bladder (OAB), which include urinary urgency, urinary frequency, nocturia, with or without urge incontinence.  While we do not know the exact etiology of OAB, several treatment options exist. We discussed management including behavioral therapy (decreasing bladder irritants, urge suppression strategies, timed voids, bladder retraining), physical therapy, medication; for refractory cases posterior tibial nerve stimulation, sacral neuromodulation, and intravesical botulinum toxin injection.  For anticholinergic medications, we discussed the potential side effects of anticholinergics including dry eyes, dry mouth, constipation, cognitive impairment and urinary retention. For Beta-3 agonist medication, we discussed the potential side effect of elevated blood pressure which is more likely to occur in individuals with uncontrolled hypertension. - Gemtesa  with relief, cost prohibitive - Mirabegron  with resolution of leakage, BP WNL. Declines increase to 50mg  due to symptomatic relief - continue behavioral modifications - pending referral to pelvic floor PT, encouraged pt to schedule

## 2023-11-22 ENCOUNTER — Ambulatory Visit (INDEPENDENT_AMBULATORY_CARE_PROVIDER_SITE_OTHER): Admitting: Obstetrics and Gynecology

## 2023-11-22 ENCOUNTER — Encounter: Payer: Self-pay | Admitting: Obstetrics and Gynecology

## 2023-11-22 ENCOUNTER — Ambulatory Visit (INDEPENDENT_AMBULATORY_CARE_PROVIDER_SITE_OTHER)

## 2023-11-22 VITALS — BP 122/82 | HR 86 | Resp 16

## 2023-11-22 DIAGNOSIS — D219 Benign neoplasm of connective and other soft tissue, unspecified: Secondary | ICD-10-CM | POA: Diagnosis not present

## 2023-11-22 DIAGNOSIS — M810 Age-related osteoporosis without current pathological fracture: Secondary | ICD-10-CM

## 2023-11-22 DIAGNOSIS — N83202 Unspecified ovarian cyst, left side: Secondary | ICD-10-CM

## 2023-11-22 DIAGNOSIS — N83201 Unspecified ovarian cyst, right side: Secondary | ICD-10-CM

## 2023-11-22 DIAGNOSIS — R102 Pelvic and perineal pain: Secondary | ICD-10-CM

## 2023-11-22 NOTE — Progress Notes (Signed)
  Patient presents today for PUS results. F/u of ovarian cyst She does report period like pain on her left side that comes and goes.  She states the pain is more noticeable now and it is bothering her. She is able to bend and move.  4th Evenity  shot given today for osteoporosis. No side effects noted with prior shots  Needs to have cardiac and medical clearance for surgery as cyst is becoming more symptomatic and enlarging from 6 months ago   Date: 03/26/2017 Department: [facility] Released By: Royston Sharlet ORN Authorizing: Rockney Evalene SQUIBB, MD   Exam Status  Status  Final [99]   PACS Intelerad Image Link   Show images for US  Transvaginal Non-OB Study Result  Narrative & Impression  Ultrasound transvaginal and transabdominal normal length and echotexture.  Fluid-filled endometrium noted at 20 x 2.9 mm.  Echogenic focus measuring 10 x 7 mm noted.  Small intramural myoma 9 x 9 mm.  Right ovary is normal with small cyst 9 mm.  Left ovary normal with adjacent thin-walled echo-free avascular cyst measuring 37 x 29 x 31 mm.  Cul-de-sac negative.   Ca125 2017 normal at 5  PUS today with enlarging cyst and fibroids.   Retroverted 6.72cm utuers Endometrial lining 2.46mm EM fluid filled with 10x23mm filling defect noted at fundal EM  3 fibroids seen measuring 1.53, 1.21, 0.72cm Left ovarian cyst measuring 5.93cm x 3.89cm complex mostly cystic avascular Right ovarian cyst measuring 19 x 16mm simple avascular  No free fluid   11/22/23 PUS 4.13cm uterus EML 1.25mm 3 fibroids noted 1.46, 1.43, 0.72cm Left obary: 7.59cm x 3.84 complex mostly simple cyst (increased from prior scan) No adnexal masses No free fluid   A/p-pelvic pain, enlarging left ovarian cyst and fibroids in menopause OVA 1 post menopause range normal. 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 Discussed concern for torsion or evolving torsion with pain on that  side and discussed s/s and when to return.  Discussed RLH/BSO as definitive way to ensure the cyst is benign and hopefully to avoid torsion and an emergency case.  Discussed the procedure in detail.  She would  like to begin scheduling and is seeing cardiology for clearance seen. Surgery scheduling PA placed. Dr. Glennon 20 minutes spent on reviewing records, imaging,  and one on one patient time and counseling patient and documentation Dr. Glennon

## 2023-11-26 ENCOUNTER — Encounter: Payer: Self-pay | Admitting: Cardiology

## 2023-11-26 ENCOUNTER — Ambulatory Visit: Attending: Cardiology | Admitting: Cardiology

## 2023-11-26 ENCOUNTER — Telehealth: Payer: Self-pay

## 2023-11-26 VITALS — BP 134/66 | HR 81 | Resp 16 | Ht 62.0 in | Wt 252.0 lb

## 2023-11-26 DIAGNOSIS — E66813 Obesity, class 3: Secondary | ICD-10-CM | POA: Insufficient documentation

## 2023-11-26 DIAGNOSIS — Z01810 Encounter for preprocedural cardiovascular examination: Secondary | ICD-10-CM | POA: Diagnosis not present

## 2023-11-26 DIAGNOSIS — I5032 Chronic diastolic (congestive) heart failure: Secondary | ICD-10-CM | POA: Insufficient documentation

## 2023-11-26 DIAGNOSIS — Z86711 Personal history of pulmonary embolism: Secondary | ICD-10-CM | POA: Insufficient documentation

## 2023-11-26 DIAGNOSIS — I10 Essential (primary) hypertension: Secondary | ICD-10-CM | POA: Diagnosis not present

## 2023-11-26 DIAGNOSIS — Z6841 Body Mass Index (BMI) 40.0 and over, adult: Secondary | ICD-10-CM | POA: Diagnosis not present

## 2023-11-26 MED ORDER — SACUBITRIL-VALSARTAN 97-103 MG PO TABS: 1.0000 | ORAL_TABLET | Freq: Two times a day (BID) | ORAL | Status: DC

## 2023-11-26 MED ORDER — SACUBITRIL-VALSARTAN 97-103 MG PO TABS: 1.0000 | ORAL_TABLET | Freq: Two times a day (BID) | ORAL | 3 refills | Status: AC

## 2023-11-26 NOTE — Patient Instructions (Signed)
 Medication Instructions:   Refill on Entresto .  *If you need a refill on your cardiac medications before your next appointment, please call your pharmacy*  Follow-Up: At [facility], you and your health needs are our priority.  As part of our continuing mission to provide you with exceptional heart care, our providers are all part of one team.  This team includes your primary Cardiologist (physician) and Advanced Practice Providers or APPs (Physician Assistants and Nurse Practitioners) who all work together to provide you with the care you need, when you need it.  Your next appointment:   6 month(s)  Provider:   Madonna Large, DO    We recommend signing up for the patient portal called MyChart.  Sign up information is provided on this After Visit Summary.  MyChart is used to connect with patients for Virtual Visits (Telemedicine).  Patients are able to view lab/test results, encounter notes, upcoming appointments, etc.  Non-urgent messages can be sent to your provider as well.   To learn more about what you can do with MyChart, go to [email or web address].

## 2023-11-26 NOTE — Telephone Encounter (Signed)
 FYI, Received request for clearance this morning, pt has appointment with you later today.

## 2023-11-26 NOTE — Progress Notes (Signed)
 Cardiology Office Note:  .   Date:  11/26/2023  ID:  Kimberly Crosby, DOB [birth year], MRN [id number] PCP:  Benjamine Aland, MD  Former Cardiology Providers: Dr. Gordy Bergamo, Emmalene Lawrence, APRN, FNP-C Pulmonologist: Dr. Shellia Medical oncologist: Dr. Arley Hof [facility] Providers Cardiologist:  Madonna Large, DO , [facility] (established care 02/26/2020) Electrophysiologist:  None  Click to update primary MD,subspecialty MD or APP then REFRESH:1}    Chief Complaint  Patient presents with   Chronic heart failure with preserved ejection fraction    Pre-op Exam    History of Present Illness: Kimberly   Kimberly Crosby is a 83 y.o. African-American female whose past medical history and cardiovascular risk factors includes: Hx of Large right-sided pulmonary embolism extending from the right main pulmonary artery into lobar, segmental and subsegmental sized branches in the right lung (08/27/2020), aortic atherosclerosis, hypertension, obesity, chronic diastolic heart failure, Former smoker, advanced age, postmenopausal female.   Patient is referred by the practice for HFpEF and essential hypertension.  She presents today for routine 90-month follow-up visit as well as discuss her preoperative risk for upcoming noncardiac surgery.  Patient denies anginal chest pain or heart failure symptoms.  She is compliant with her current GDMT for HFpEF.  Physically active for age.  She goes to the mall at least twice a week and walks 1-1.5 hours without exertional chest pain.  She does have shortness of breath at baseline which is chronic and stable.  Patient states that she is being considered for a radical laparoscopic hysterectomy with bilateral salpingo-oophorectomy under the care of Dr. Glennon.  The date of surgery is to be determined.  Review of Systems: .   Review of Systems  Cardiovascular:  Positive for dyspnea on exertion (chronic and stable). Negative for chest pain, claudication, irregular  heartbeat, leg swelling, near-syncope, orthopnea, palpitations, paroxysmal nocturnal dyspnea and syncope.  Respiratory:  Negative for shortness of breath.   Hematologic/Lymphatic: Negative for bleeding problem.    Studies Reviewed:   EKG: EKG Interpretation Date/Time:  Monday November 26 2023 13:13:36 EDT Ventricular Rate:  73 PR Interval:  152 QRS Duration:  70 QT Interval:  366 QTC Calculation: 403 R Axis:   7  Text Interpretation: Normal sinus rhythm When compared with ECG of 10-May-2023 14:23, No significant change since last tracing Confirmed by Large Madonna [phone]) on 11/26/2023 1:18:57 PM  Echocardiogram: April 2022: LVEF 65 to 70%, grade 1 diastolic dysfunction. Right ventricular size mildly enlarged, right ventricular systolic function normal. Estimated RVSP 34.6 mmHg. Estimated RAP 3 mmHg. See report for additional details  June 2025  LVEF 60 to 65%, moderate LVH, grade 2 diastolic dysfunction, right ventricular size and function normal, RVSP 38 mmHg, mild to moderately dilated left atrium, estimated RAP 3 mmHg  Stress Testing: Lexiscan  stress test May 2022: Low risk study  RADIOLOGY: CTA PE Protocol  08/27/2020: 1. Large right-sided pulmonary embolism extending from the right main pulmonary artery into lobar, segmental and subsegmental sized branches in the right lung. 2. Mild cardiomegaly. 3. Aortic atherosclerosis.  Risk Assessment/Calculations:   N/A   Labs:       Latest Ref Rng & Units 05/03/2022    8:04 PM 05/03/2022    6:15 PM 08/28/2020   11:49 PM  CBC  WBC 4.0 - 10.5 K/uL  9.0  9.2   Hemoglobin 12.0 - 15.0 g/dL 86.6  87.0    87.1  88.2   Hematocrit 36.0 - 46.0 % 39.0  38.0  40.5  35.7   Platelets 150 - 400 K/uL  255  236        Latest Ref Rng & Units 04/10/2023    1:41 PM 05/03/2022    8:04 PM 05/03/2022    6:15 PM  BMP  Glucose 70 - 99 mg/dL 93  895  893    883   BUN 8 - 27 mg/dL 17  15  13    14    Creatinine 0.57 - 1.00 mg/dL 9.00   9.09  8.91    8.99   BUN/Creat Ratio 12 - 28 17     Sodium 134 - 144 mmol/L 145  143  139    141   Potassium 3.5 - 5.2 mmol/L 3.9  4.0  4.2    3.9   Chloride 96 - 106 mmol/L 109  108  113    109   CO2 20 - 29 mmol/L 21   19   Calcium 8.7 - 10.3 mg/dL 9.5   9.3       Latest Ref Rng & Units 04/10/2023    1:41 PM 05/03/2022    8:04 PM 05/03/2022    6:15 PM  CMP  Glucose 70 - 99 mg/dL 93  895  893    883   BUN 8 - 27 mg/dL 17  15  13    14    Creatinine 0.57 - 1.00 mg/dL 9.00  9.09  8.91    8.99   Sodium 134 - 144 mmol/L 145  143  139    141   Potassium 3.5 - 5.2 mmol/L 3.9  4.0  4.2    3.9   Chloride 96 - 106 mmol/L 109  108  113    109   CO2 20 - 29 mmol/L 21   19   Calcium 8.7 - 10.3 mg/dL 9.5   9.3   Total Protein 6.0 - 8.5 g/dL 6.9   7.1   Total Bilirubin 0.0 - 1.2 mg/dL 0.7   0.7   Alkaline Phos 44 - 121 IU/L 97   78   AST 0 - 40 IU/L 16   17   ALT 0 - 32 IU/L 14   11     Lab Results  Component Value Date   CHOL 165 04/10/2023   HDL 68 04/10/2023   LDLCALC 86 04/10/2023   LDLDIRECT 98 04/10/2023   TRIG 54 04/10/2023   No results for input(s): LIPOA in the last 8760 hours. No components found for: NTPROBNP Recent Labs    04/10/23 1341  PROBNP 106   No results for input(s): TSH in the last 8760 hours.   Physical Exam:    Today's Vitals   11/26/23 1310  BP: 134/66  Pulse: 81  Resp: 16  SpO2: 97%  Weight: 252 lb (114.3 kg)  Height: 5' 2 (1.575 m)    Body mass index is 46.09 kg/m. Wt Readings from Last 3 Encounters:  11/26/23 252 lb (114.3 kg)  06/13/23 249 lb (112.9 kg)  05/10/23 250 lb (113.4 kg)    Physical Exam  Constitutional: No distress.  Age appropriate, hemodynamically stable.   Neck: No JVD present.  Cardiovascular: Normal rate, regular rhythm, S1 normal, S2 normal, intact distal pulses and normal pulses. Exam reveals no gallop, no S3 and no S4.  No murmur heard. Pulmonary/Chest: Effort normal and breath sounds normal. No  stridor. She has no wheezes. She has no rales.  Abdominal: Soft. Bowel sounds are normal. She exhibits  no distension. There is no abdominal tenderness.  Abdominal obesity  Musculoskeletal:        General: No edema.     Cervical back: Neck supple.  Neurological: She is alert and oriented to person, place, and time. She has intact cranial nerves (2-12).  Skin: Skin is warm and moist.   Impression & Recommendation(s):  Impression:   ICD-10-CM   1. Chronic heart failure with preserved ejection fraction (HFpEF) (HCC)  I50.32 EKG 12-Lead    sacubitril -valsartan  (ENTRESTO ) 97-103 MG    2. Preop cardiovascular exam  Z01.810     3. Essential hypertension  I10     4. Hx of pulmonary embolus  Z86.711     5. Class 3 severe obesity due to excess calories with serious comorbidity and body mass index (BMI) of 45.0 to 49.9 in adult  E66.813    Z68.42         Recommendation(s):  Chronic heart failure with preserved ejection fraction (HFpEF) (HCC) Clinically euvolemic. Echo June 2025: LVEF 60 to 65%, grade 2 diastolic dysfunction, no significant valvular heart disease Continue Entresto  97/103 mg p.o. twice daily.  Refill provided. Continue spironolactone  25 mg p.o. every morning. Continue Toprol -XL 25 mg p.o. daily. Continue Lasix  20 mg p.o. daily Intermittently patient does feel lightheaded and dizzy on current medical therapy.  Likely exacerbated by the hot summer weather. Will hold off on up titration of GDMT at this time due to her intermittent symptoms.  Preoperative cardiovascular examination: According to the Revised Cardiac Risk Index (RCRI), her Perioperative Risk of Major Cardiac Event is (%): 0.9 Her Functional Capacity in METs is: 5.07 according to the Duke Activity Status Index (DASI). Being considered for radical laparoscopic hysterectomy with bilateral salpingo-oophorectomy. Echo June 2025: Preserved LVEF, no significant valvular heart disease EKG today is  nonischemic. Patient is considered to be acceptable risk for upcoming noncardiac surgery.  She is optimized from a cardiovascular standpoint at this time.  As long as there is no new symptoms patient is considered to be acceptable risk for upcoming noncardiac surgery with her OB/GYN.  If change in clinical status she needs to be reevaluated.  Would recommend the surgery be done at the hospital as opposed to a surgical center. Her oral anticoagulation will be managed by the prescribing provider and her care team.  Patient is aware of this and will be seen her PCP shortly.  Essential hypertension Office blood pressures are well-controlled. Medications as discussed above Reemphasized importance of low-salt diet.  Hx of pulmonary embolus Currently on anticoagulation with Xarelto  20 mg p.o. daily.  Managed by other providers and the care team  Class 3 severe obesity due to excess calories with serious comorbidity and body mass index (BMI) of 45.0 to 49.9 in adult [facility]) Body mass index is 46.09 kg/m. I reviewed with her importance of diet, regular physical activity/exercise, weight loss.   Patient is educated on the importance of increasing physical activity gradually as tolerated with a goal of moderate intensity exercise for 30 minutes a day 5 days a week.  Discussed management of at least 2 chronic comorbid conditions. Recent echocardiogram and EKG results reviewed. Preoperative risk assessment discussed as mentioned above, and patient agreeable. Reviewed Dr. Mcarthur progress note from 11/22/2023 Medication reconciliation and prescription drug management. Refills provided. Coordination of care   Orders Placed:  Orders Placed This Encounter  Procedures   EKG 12-Lead     Final Medication List:    Meds ordered this encounter  Medications  DISCONTD: sacubitril -valsartan  (ENTRESTO ) 97-103 MG    Sig: Take 1 tablet by mouth 2 (two) times daily.   sacubitril -valsartan  (ENTRESTO ) 97-103  MG    Sig: Take 1 tablet by mouth 2 (two) times daily.    Dispense:  180 tablet    Refill:  3    Medications Discontinued During This Encounter  Medication Reason   sacubitril -valsartan  (ENTRESTO ) 97-103 MG Reorder   sacubitril -valsartan  (ENTRESTO ) 97-103 MG      Current Outpatient Medications:    albuterol  (VENTOLIN  HFA) 108 (90 Base) MCG/ACT inhaler, SMARTSIG:2 Puff(s) By Mouth Every 6 Hours, Disp: , Rfl:    B Complex Vitamins (B-COMPLEX/B-12 PO), Take by mouth., Disp: , Rfl:    Budeson-Glycopyrrol-Formoterol (BREZTRI  AEROSPHERE) 160-9-4.8 MCG/ACT AERO, Inhale 2 puffs into the lungs in the morning and at bedtime., Disp: 10.7 g, Rfl: 5   diclofenac  Sodium (VOLTAREN ) 1 % GEL, Apply 2 g topically 4 (four) times daily., Disp: 100 g, Rfl: 1   EPINEPHrine  (EPIPEN  2-PAK) 0.3 mg/0.3 mL IJ SOAJ injection, Inject 0.3 mg into the muscle as needed for anaphylaxis., Disp: 2 each, Rfl: 1   famotidine (PEPCID) 20 MG tablet, Take 20 mg by mouth daily., Disp: , Rfl:    fexofenadine  (ALLEGRA ) 180 MG tablet, Take 1 tablet (180 mg total) by mouth daily., Disp: 30 tablet, Rfl: 5   furosemide  (LASIX ) 20 MG tablet, TAKE 1 TABLET(20 MG) BY MOUTH EVERY OTHER DAY, Disp: 30 tablet, Rfl: 5   latanoprost  (XALATAN ) 0.005 % ophthalmic solution, Place 1 drop into both eyes at bedtime., Disp: , Rfl:    metoprolol  succinate (TOPROL -XL) 25 MG 24 hr tablet, TAKE 1 TABLET(25 MG) BY MOUTH DAILY, Disp: 90 tablet, Rfl: 3   mirabegron  ER (MYRBETRIQ ) 25 MG TB24 tablet, TAKE 1 TABLET(25 MG) BY MOUTH DAILY, Disp: 30 tablet, Rfl: 0   spironolactone  (ALDACTONE ) 25 MG tablet, TAKE 1 TABLET(25 MG) BY MOUTH EVERY MORNING, Disp: 90 tablet, Rfl: 3   UNABLE TO FIND, Med Name: CPAP, Disp: , Rfl:    VITAMIN D  PO, Take by mouth., Disp: , Rfl:    XARELTO  20 MG TABS tablet, Take 20 mg by mouth daily., Disp: , Rfl:    sacubitril -valsartan  (ENTRESTO ) 97-103 MG, Take 1 tablet by mouth 2 (two) times daily., Disp: 180 tablet, Rfl: 3  Current  Facility-Administered Medications:    omalizumab  (XOLAIR ) injection 300 mg, 300 mg, Subcutaneous, Q28 days, Jeneal Danita Macintosh, MD, 300 mg at 11/07/23 1117   Romosozumab -aqqg (EVENITY ) 105 MG/1. injection 210 mg, 210 mg, Subcutaneous, Once, Glennon Almarie POUR, MD   Romosozumab -aqqg (EVENITY ) 105 MG/1. injection 210 mg, 210 mg, Subcutaneous, Q30 days, Glennon Almarie POUR, MD, 210 mg at 11/22/23 1200  Consent:   N/A  Disposition:   12 months sooner if needed Patient may be asked to follow-up sooner based on the results of the above-mentioned testing.  Her questions and concerns were addressed to her satisfaction. She voices understanding of the recommendations provided during this encounter.    Signed, Madonna Michele HAS, [facility]  A Division of [facility] [address] [place], [place] [address]  [place], [place] [address]  11/26/2023 2:14 PM

## 2023-11-26 NOTE — Telephone Encounter (Signed)
 See the note  OAC to managed by other providers in her care team.   Madonna Large, DO, FACC

## 2023-11-26 NOTE — Telephone Encounter (Signed)
   Pre-operative Risk Assessment    Patient Name: Kimberly Crosby  DOB: 17-Jan-1941 MRN: [id number]   Date of last office visit: 05/10/23 MADONNA LARGE, DO Date of next office visit: 11/26/23 MADONNA LARGE, DO   Request for Surgical Clearance    Procedure:  ROBOTIC TOTAL LAPAROSCOPIC HYPERECTOMY WITH BILATERAL SALPINGO- OOPHORECTOMY AND CYSTOSCOPY  Date of Surgery:  Clearance TBD                                Surgeon:  DR GLENNON Socks Group or Practice Name:  [facility] Phone number:  [phone] -Fax number:  [phone]   Type of Clearance Requested:   - Medical  - Pharmacy:  Hold Rivaroxaban  (Xarelto )     Type of Anesthesia:  Not Indicated   Additional requests/questions:    Signed, Lucie DELENA Ku   11/26/2023, 8:29 AM

## 2023-11-27 NOTE — Telephone Encounter (Signed)
 Clearance note from 11/26/23 faxed to requesting provider with confirmation received.

## 2023-11-28 ENCOUNTER — Ambulatory Visit (INDEPENDENT_AMBULATORY_CARE_PROVIDER_SITE_OTHER)

## 2023-11-28 ENCOUNTER — Ambulatory Visit: Payer: Self-pay | Admitting: Obstetrics

## 2023-11-28 ENCOUNTER — Other Ambulatory Visit (HOSPITAL_COMMUNITY)
Admission: RE | Admit: 2023-11-28 | Discharge: 2023-11-28 | Disposition: A | Discharge status: Home / Self Care | Source: Other Acute Inpatient Hospital | Attending: Obstetrics | Admitting: Obstetrics

## 2023-11-28 VITALS — BP 117/64 | HR 127 | Temp 97.8°F

## 2023-11-28 DIAGNOSIS — R82998 Other abnormal findings in urine: Secondary | ICD-10-CM | POA: Insufficient documentation

## 2023-11-28 DIAGNOSIS — R300 Dysuria: Secondary | ICD-10-CM | POA: Diagnosis not present

## 2023-11-28 DIAGNOSIS — R319 Hematuria, unspecified: Secondary | ICD-10-CM

## 2023-11-28 DIAGNOSIS — R350 Frequency of micturition: Secondary | ICD-10-CM

## 2023-11-28 LAB — URINALYSIS, ROUTINE W REFLEX MICROSCOPIC
Bilirubin Urine: NEGATIVE
Glucose, UA: NEGATIVE mg/dL
Ketones, ur: NEGATIVE mg/dL
Nitrite: POSITIVE — AB
Protein, ur: 100 mg/dL — AB
Specific Gravity, Urine: 1.013 (ref 1.005–1.030)
WBC, UA: >50 WBC/hpf (ref 0–5)
pH: 6 (ref 5.0–8.0)

## 2023-11-28 LAB — POCT URINALYSIS DIP (CLINITEK)
Glucose, UA: NEGATIVE mg/dL
Ketones, POC UA: NEGATIVE mg/dL
Nitrite, UA: POSITIVE — AB
POC PROTEIN,UA: 100 — AB
Spec Grav, UA: 1.015 (ref 1.010–1.025)
Urobilinogen, UA: 2 U/dL — AB
pH, UA: 6 (ref 5.0–8.0)

## 2023-11-28 MED ORDER — SULFAMETHOXAZOLE-TRIMETHOPRIM 800-160 MG PO TABS: 1.0000 | ORAL_TABLET | Freq: Two times a day (BID) | ORAL | 0 refills | Status: AC

## 2023-11-28 NOTE — Progress Notes (Signed)
 Nautia arrived today with dysuria, nausea, and urinary frequency. Patient is not experiencing fever, unstable vitals and/or one-sided back flank pain. Patienhas not had a recent hospitalization due to UTI.  Last visit in the office was 11/16/2023.  Per protocol:   The most recent Urinalysis completed on 11-27-2023 and was not normal.  Last Creatinine level  Lab Results  Component Value Date   CREATININE 0.99 04/10/2023    An urine specimen was collected and POCT urinalysis completed. [x] A cath specimen was collected due to patient's current condition, symptoms or post-procedural state.  Total urine output by catheter is 125 ml. Output by Drain (mL) 11/26/23 0701 - 11/26/23 1900 11/26/23 1901 - 11/27/23 0700 11/27/23 0701 - 11/27/23 1900 11/27/23 1901 - 11/28/23 0700 11/28/23 0701 - 11/28/23 1428  Patient has no LDAs of requested type attached.    SABRA    POCT Urine results is not normal.  Urine micro was sent per protocol for abnormal urinalysis.  Urine culture was sent per protocol for abnormal urinalysis.     [x] Pt was notified of positive urine results and plan for additional urine testing. We will contact you within the next 3-4 days with these results.  [] No Prescription was sent to your pharmacy.  The additional testing will indicate if a prescription is needed.   [x] Patient was notified of abnormal urine results. The following prescription is sent to your preferred pharmacy.  []  Macrobid 100mg  #10 1 tablet by mouth twice daily with food for 5 days      []  Bactrim  DS 800-160mg  #6 1 tablet by mouth twice daily for 3 days        []  Due to your current medication allergies, an alternate prescription was discussed with your provider and will be prescribed and sent to your pharmacy.  [x] You can take over the counter AZO two tablets up to three times a day for two days.  Take AZO tablets with a full glass of water. AZO will turn your urine orange, this is normal.   [] The patient was notified  of negative urine results.  If symptoms persist, you may take over the counter AZO two tablets up to three times a day for two days.  AZO will turn your urine orange, this is normal.  Contact the office back to schedule an appointment if your symptoms persist or worsen or you develop additional symptoms.       CC'd note to patient's provider.

## 2023-11-28 NOTE — Addendum Note (Signed)
 Addended by: KRYSTAL ANDREE GAILS on: 11/28/2023 04:13 PM   Modules accepted: Orders

## 2023-11-28 NOTE — Patient Instructions (Signed)
 Your urine test was positive for a UTI.  We have sent your urine sample for additional work up.    We have sent an antibiotic to the pharmacy for you.   It was a pleasure to see you today!  Thank you for trusting me with your care!

## 2023-11-30 LAB — URINE CULTURE: Culture: >=100000 — AB

## 2023-12-04 ENCOUNTER — Ambulatory Visit (INDEPENDENT_AMBULATORY_CARE_PROVIDER_SITE_OTHER)

## 2023-12-04 DIAGNOSIS — L501 Idiopathic urticaria: Secondary | ICD-10-CM

## 2023-12-05 ENCOUNTER — Ambulatory Visit

## 2023-12-24 ENCOUNTER — Ambulatory Visit (INDEPENDENT_AMBULATORY_CARE_PROVIDER_SITE_OTHER)

## 2023-12-24 DIAGNOSIS — M810 Age-related osteoporosis without current pathological fracture: Secondary | ICD-10-CM | POA: Diagnosis not present

## 2024-01-01 ENCOUNTER — Ambulatory Visit (INDEPENDENT_AMBULATORY_CARE_PROVIDER_SITE_OTHER)

## 2024-01-01 DIAGNOSIS — L501 Idiopathic urticaria: Secondary | ICD-10-CM | POA: Diagnosis not present

## 2024-01-17 ENCOUNTER — Other Ambulatory Visit: Payer: Self-pay | Admitting: Family Medicine

## 2024-01-17 DIAGNOSIS — Z1231 Encounter for screening mammogram for malignant neoplasm of breast: Secondary | ICD-10-CM

## 2024-01-29 ENCOUNTER — Ambulatory Visit
Admission: RE | Admit: 2024-01-29 | Discharge: 2024-01-29 | Disposition: A | Discharge status: Home / Self Care | Source: Ambulatory Visit | Attending: Family Medicine | Admitting: Family Medicine

## 2024-01-29 ENCOUNTER — Ambulatory Visit

## 2024-01-29 DIAGNOSIS — L501 Idiopathic urticaria: Secondary | ICD-10-CM

## 2024-01-29 DIAGNOSIS — Z1231 Encounter for screening mammogram for malignant neoplasm of breast: Secondary | ICD-10-CM | POA: Diagnosis not present

## 2024-02-04 DIAGNOSIS — I110 Hypertensive heart disease with heart failure: Secondary | ICD-10-CM | POA: Diagnosis not present

## 2024-02-04 DIAGNOSIS — E785 Hyperlipidemia, unspecified: Secondary | ICD-10-CM | POA: Diagnosis not present

## 2024-02-04 DIAGNOSIS — E669 Obesity, unspecified: Secondary | ICD-10-CM | POA: Diagnosis not present

## 2024-02-04 DIAGNOSIS — I2699 Other pulmonary embolism without acute cor pulmonale: Secondary | ICD-10-CM | POA: Diagnosis not present

## 2024-02-04 DIAGNOSIS — I5032 Chronic diastolic (congestive) heart failure: Secondary | ICD-10-CM | POA: Diagnosis not present

## 2024-02-05 ENCOUNTER — Ambulatory Visit (INDEPENDENT_AMBULATORY_CARE_PROVIDER_SITE_OTHER)

## 2024-02-05 DIAGNOSIS — M810 Age-related osteoporosis without current pathological fracture: Secondary | ICD-10-CM | POA: Diagnosis not present

## 2024-02-05 MED ORDER — ROMOSOZUMAB-AQQG 105 MG/1.17ML ~~LOC~~ SOSY
210.0000 mg | PREFILLED_SYRINGE | SUBCUTANEOUS | Status: AC
  Administered 2024-04-02: 210 mg via SUBCUTANEOUS

## 2024-02-06 ENCOUNTER — Other Ambulatory Visit: Payer: Self-pay | Admitting: Cardiology

## 2024-02-06 DIAGNOSIS — I5032 Chronic diastolic (congestive) heart failure: Secondary | ICD-10-CM

## 2024-02-26 ENCOUNTER — Ambulatory Visit

## 2024-02-26 DIAGNOSIS — L501 Idiopathic urticaria: Secondary | ICD-10-CM

## 2024-03-12 ENCOUNTER — Other Ambulatory Visit (HOSPITAL_COMMUNITY): Payer: Self-pay

## 2024-03-12 ENCOUNTER — Telehealth: Payer: Self-pay

## 2024-03-12 NOTE — Telephone Encounter (Signed)
 Pt ready for scheduling for EVENITY  on or after : 03/12/24  Option# 1 Buy/Bill (Office supplied medication)  Out-of-pocket cost due at time of  office visit: $0  Number of injection/visits approved: ---  Primary: MEDICARE Evenity  co-insurance: 0% Admin fee co-insurance: 0%  Secondary: BCBS[place]-MEDSUP Evenity  co-insurance:  Admin fee co-insurance:   Medical Benefit Details: Date Benefits were checked: 01/10/24 Deductible: $257 Met of $257 Required/ Coinsurance: 0%/ Admin Fee: 0%  Prior Auth: N/A PA# Expiration Date:   # of doses approved: ------------------------------------------------------------------------- Option# 2- Med Obtained from pharmacy  Pharmacy benefit: Copay $--- (Paid to pharmacy) Admin Fee: --- (Pay at clinic)  Prior Auth: --- PA# Expiration Date:   # of doses approved:  If patient wants fill through the pharmacy benefit please send prescription to: ---, and include estimated need by date in rx notes. Pharmacy will ship medication directly to the office.  Patient NOT eligible for Evenity  Copay Card. Copay Card can make patient's cost as little as $25. Link to apply: [email or web address]://[email or web address]   This summary of benefits is an estimation of the patient's out-of-pocket cost. Exact cost may very based on individual plan coverage.

## 2024-03-12 NOTE — Telephone Encounter (Signed)
 SABRA

## 2024-03-24 NOTE — Telephone Encounter (Signed)
 See Evenity  referral.

## 2024-03-25 ENCOUNTER — Ambulatory Visit

## 2024-03-28 ENCOUNTER — Other Ambulatory Visit: Payer: Self-pay | Admitting: Obstetrics

## 2024-03-28 ENCOUNTER — Ambulatory Visit (INDEPENDENT_AMBULATORY_CARE_PROVIDER_SITE_OTHER)

## 2024-03-28 DIAGNOSIS — L501 Idiopathic urticaria: Secondary | ICD-10-CM | POA: Diagnosis not present

## 2024-03-28 DIAGNOSIS — N3946 Mixed incontinence: Secondary | ICD-10-CM

## 2024-03-28 DIAGNOSIS — R351 Nocturia: Secondary | ICD-10-CM

## 2024-03-31 NOTE — Telephone Encounter (Signed)
 Per chart last rx of Mirabegron  was 10/23/23, #30, 0 rf.   OV 11/16/23 notes: - encouraged trial of mirabegron  discontinuation if she continues to experience lower abdominal discomfort   My Chart message sent to patient asking if she has been taking this medication continually or is wanting to restart.

## 2024-04-01 ENCOUNTER — Other Ambulatory Visit: Payer: Self-pay | Admitting: Family Medicine

## 2024-04-01 ENCOUNTER — Ambulatory Visit
Admission: RE | Admit: 2024-04-01 | Discharge: 2024-04-01 | Disposition: A | Discharge status: Home / Self Care | Source: Ambulatory Visit | Attending: Family Medicine | Admitting: Family Medicine

## 2024-04-01 DIAGNOSIS — J45909 Unspecified asthma, uncomplicated: Secondary | ICD-10-CM | POA: Diagnosis not present

## 2024-04-01 DIAGNOSIS — J9801 Acute bronchospasm: Secondary | ICD-10-CM

## 2024-04-02 ENCOUNTER — Ambulatory Visit (INDEPENDENT_AMBULATORY_CARE_PROVIDER_SITE_OTHER)

## 2024-04-02 DIAGNOSIS — M810 Age-related osteoporosis without current pathological fracture: Secondary | ICD-10-CM

## 2024-04-02 NOTE — Progress Notes (Signed)
 Evenity  injection given SQ left and right arms.  Patient tolerated injection well. Last OV 11/22/23 EB, Last AEX 03/20/23 EB, Last DEXA 03/21/22, Calcium level 04/11/23: result 9.5

## 2024-04-04 ENCOUNTER — Other Ambulatory Visit (HOSPITAL_BASED_OUTPATIENT_CLINIC_OR_DEPARTMENT_OTHER): Payer: Self-pay

## 2024-04-09 DIAGNOSIS — J45909 Unspecified asthma, uncomplicated: Secondary | ICD-10-CM | POA: Diagnosis not present

## 2024-04-16 ENCOUNTER — Encounter: Payer: Self-pay | Admitting: Cardiology

## 2024-04-17 ENCOUNTER — Encounter: Payer: Self-pay | Admitting: Obstetrics

## 2024-04-17 ENCOUNTER — Ambulatory Visit: Admitting: Obstetrics

## 2024-04-17 ENCOUNTER — Other Ambulatory Visit: Payer: Self-pay | Admitting: Obstetrics

## 2024-04-17 VITALS — BP 127/76 | HR 80

## 2024-04-17 DIAGNOSIS — N3946 Mixed incontinence: Secondary | ICD-10-CM

## 2024-04-17 DIAGNOSIS — R351 Nocturia: Secondary | ICD-10-CM | POA: Diagnosis not present

## 2024-04-17 DIAGNOSIS — R102 Pelvic and perineal pain unspecified side: Secondary | ICD-10-CM | POA: Diagnosis not present

## 2024-04-17 MED ORDER — MIRABEGRON ER 25 MG PO TB24: 25.0000 mg | ORAL_TABLET | Freq: Every day | ORAL | 0 refills | Status: AC

## 2024-04-17 NOTE — Assessment & Plan Note (Signed)
-   06/13/23 POCT UA negative, PVR 60mL, repeat UA + bilirubin 11/16/23. Denies UTI symptoms - urgency bother > stress previously resolved with mirabegron  25mg  and discontinued pad use. Reports lower abdominal soreness 2/10 since starting mirabegron  that resolved spontaneously. Desires to resume with Rx provided. Advised to recheck BP and consider increasing to 50mg  daily if desired. - We discussed the symptoms of overactive bladder (OAB), which include urinary urgency, urinary frequency, nocturia, with or without urge incontinence.  While we do not know the exact etiology of OAB, several treatment options exist. We discussed management including behavioral therapy (decreasing bladder irritants, urge suppression strategies, timed voids, bladder retraining), physical therapy, medication; for refractory cases posterior tibial nerve stimulation, sacral neuromodulation, and intravesical botulinum toxin injection.  For anticholinergic medications, we discussed the potential side effects of anticholinergics including dry eyes, dry mouth, constipation, cognitive impairment and urinary retention. For Beta-3 agonist medication, we discussed the potential side effect of elevated blood pressure which is more likely to occur in individuals with uncontrolled hypertension. - Gemtesa  with relief, cost prohibitive - continue behavioral modifications - encouraged referral to pelvic floor PT, encouraged pt to reschedule with information provided

## 2024-04-17 NOTE — Patient Instructions (Addendum)
°  For Beta-3 agonist medication,there is a potential side effect of elevated blood pressure which is more likely to occur in individuals with uncontrolled hypertension. It appears that your most recent blood pressure is within normal limits 120s/70s. Please monitor your blood pressure and stop the medication if you experience any headache, chest discomfort, or shortness of breath and seek care immediately.  I have sent your prescription of mirabegron  to your pharmacy. Start at 25mg  daily for 1 month, if your blood pressure remains unchanged, increase to 50mg  after 1 month and continue to monitor your blood pressure.  Please call [phone] to schedule the earliest appointment for pelvic floor PT.  For vaginal atrophy (thinning of the vaginal tissue that can cause dryness and burning) and UTI prevention we discussed estrogen replacement in the form of vaginal cream.   Start vaginal estrogen therapy nightly for two weeks then 2 times weekly at night. This can be placed with your finger or an applicator inside the vagina and around the urethra.  Please let us  know if the prescription is too expensive and we can look for alternative options.   Is vaginal estrogen therapy safe for me? Vaginal estrogen preparations act on the vaginal skin, and only a very tiny amount is absorbed into the bloodstream (0.01%).  They work in a similar way to hand or face cream.  There is minimal absorption and they are therefore perfectly safe. If you have had breast cancer and have persistent troublesome symptoms which aren't settling with vaginal moisturisers and lubricants, local estrogen treatment may be a possibility, but consultation with your oncologist should take place first.
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
## 2024-04-17 NOTE — Assessment & Plan Note (Signed)
-   reduced to 1x/night on Gemtesa , cost prohibitive - 1-2x/night on mirabegron , now 2-3x/night without meds. Rx to resume mirabegron  25mg  - avoid fluid intake after 6pm - continue to elevate feet during the day or use compression socks to reduce lower extremity swelling - continue diuretic (e.g. furosemide ) dosing at 2pm - continue CPAP for sleep apnea

## 2024-04-25 ENCOUNTER — Ambulatory Visit

## 2024-04-25 DIAGNOSIS — L501 Idiopathic urticaria: Secondary | ICD-10-CM | POA: Diagnosis not present

## 2024-05-22 ENCOUNTER — Other Ambulatory Visit: Payer: Self-pay | Admitting: Cardiology

## 2024-05-22 DIAGNOSIS — I5032 Chronic diastolic (congestive) heart failure: Secondary | ICD-10-CM

## 2024-05-23 ENCOUNTER — Ambulatory Visit

## 2024-05-23 DIAGNOSIS — L501 Idiopathic urticaria: Secondary | ICD-10-CM

## 2024-06-04 ENCOUNTER — Other Ambulatory Visit: Payer: Self-pay | Admitting: Obstetrics

## 2024-06-04 ENCOUNTER — Telehealth: Payer: Self-pay | Admitting: Obstetrics

## 2024-06-04 DIAGNOSIS — R159 Full incontinence of feces: Secondary | ICD-10-CM

## 2024-06-04 DIAGNOSIS — N3946 Mixed incontinence: Secondary | ICD-10-CM

## 2024-06-04 DIAGNOSIS — R351 Nocturia: Secondary | ICD-10-CM

## 2024-06-04 DIAGNOSIS — R3914 Feeling of incomplete bladder emptying: Secondary | ICD-10-CM

## 2024-06-04 DIAGNOSIS — R102 Pelvic and perineal pain unspecified side: Secondary | ICD-10-CM

## 2024-06-04 NOTE — Progress Notes (Signed)
 Referral to pelvic floor PT resent

## 2024-06-04 NOTE — Telephone Encounter (Signed)
 Pt called in and stated she needs another referral to PT.  She waited to late to schedule appt and now referral is expired.  Can you please send another?

## 2024-06-05 NOTE — Telephone Encounter (Signed)
Pt informed to call to schedule.

## 2024-06-20 ENCOUNTER — Ambulatory Visit

## 2024-07-17 ENCOUNTER — Ambulatory Visit: Admitting: Obstetrics

## 2024-07-31 ENCOUNTER — Other Ambulatory Visit (HOSPITAL_BASED_OUTPATIENT_CLINIC_OR_DEPARTMENT_OTHER)

## 2024-07-31 ENCOUNTER — Ambulatory Visit: Admitting: Physical Therapy
